# Patient Record
Sex: Male | Born: 1975 | Race: White | Hispanic: No | Marital: Married | Smoking: Former smoker
Health system: Southern US, Community
[De-identification: ages and names within clinical notes are randomized; demographics above are authoritative.]

## PROBLEM LIST (undated history)

## (undated) DIAGNOSIS — I10 Essential (primary) hypertension: Secondary | ICD-10-CM

## (undated) DIAGNOSIS — G4730 Sleep apnea, unspecified: Secondary | ICD-10-CM

## (undated) DIAGNOSIS — K219 Gastro-esophageal reflux disease without esophagitis: Secondary | ICD-10-CM

## (undated) DIAGNOSIS — N200 Calculus of kidney: Secondary | ICD-10-CM

## (undated) HISTORY — PX: KNEE SURGERY: SHX244

## (undated) HISTORY — PX: LITHOTRIPSY: SUR834

---

## 1999-10-11 ENCOUNTER — Emergency Department (HOSPITAL_COMMUNITY): Admission: EM | Admit: 1999-10-11 | Discharge: 1999-10-11 | Payer: Self-pay | Admitting: Emergency Medicine

## 1999-10-13 ENCOUNTER — Emergency Department (HOSPITAL_COMMUNITY): Admission: EM | Admit: 1999-10-13 | Discharge: 1999-10-13 | Payer: Self-pay | Admitting: Podiatry

## 1999-11-14 ENCOUNTER — Emergency Department (HOSPITAL_COMMUNITY): Admission: EM | Admit: 1999-11-14 | Discharge: 1999-11-14 | Payer: Self-pay | Admitting: Emergency Medicine

## 1999-11-14 ENCOUNTER — Encounter: Payer: Self-pay | Admitting: Emergency Medicine

## 2000-04-19 ENCOUNTER — Emergency Department (HOSPITAL_COMMUNITY): Admission: EM | Admit: 2000-04-19 | Discharge: 2000-04-19 | Payer: Self-pay | Admitting: Emergency Medicine

## 2000-09-06 ENCOUNTER — Encounter: Payer: Self-pay | Admitting: Emergency Medicine

## 2000-09-06 ENCOUNTER — Emergency Department (HOSPITAL_COMMUNITY): Admission: EM | Admit: 2000-09-06 | Discharge: 2000-09-06 | Payer: Self-pay | Admitting: Emergency Medicine

## 2000-12-01 ENCOUNTER — Emergency Department (HOSPITAL_COMMUNITY): Admission: EM | Admit: 2000-12-01 | Discharge: 2000-12-01 | Payer: Self-pay | Admitting: *Deleted

## 2002-07-30 ENCOUNTER — Emergency Department (HOSPITAL_COMMUNITY): Admission: EM | Admit: 2002-07-30 | Discharge: 2002-07-30 | Payer: Self-pay | Admitting: Emergency Medicine

## 2002-08-26 ENCOUNTER — Emergency Department (HOSPITAL_COMMUNITY): Admission: EM | Admit: 2002-08-26 | Discharge: 2002-08-26 | Payer: Self-pay | Admitting: Emergency Medicine

## 2002-08-26 ENCOUNTER — Encounter: Payer: Self-pay | Admitting: Emergency Medicine

## 2002-09-16 ENCOUNTER — Ambulatory Visit (HOSPITAL_BASED_OUTPATIENT_CLINIC_OR_DEPARTMENT_OTHER): Admission: RE | Admit: 2002-09-16 | Discharge: 2002-09-16 | Payer: Self-pay | Admitting: Urology

## 2003-01-18 ENCOUNTER — Emergency Department (HOSPITAL_COMMUNITY): Admission: EM | Admit: 2003-01-18 | Discharge: 2003-01-19 | Payer: Self-pay | Admitting: Emergency Medicine

## 2003-01-18 ENCOUNTER — Encounter: Payer: Self-pay | Admitting: Emergency Medicine

## 2003-03-08 ENCOUNTER — Emergency Department (HOSPITAL_COMMUNITY): Admission: EM | Admit: 2003-03-08 | Discharge: 2003-03-08 | Payer: Self-pay | Admitting: Emergency Medicine

## 2003-03-20 ENCOUNTER — Emergency Department (HOSPITAL_COMMUNITY): Admission: EM | Admit: 2003-03-20 | Discharge: 2003-03-20 | Payer: Self-pay | Admitting: *Deleted

## 2003-03-21 ENCOUNTER — Encounter: Payer: Self-pay | Admitting: Emergency Medicine

## 2003-03-21 IMAGING — CT CT ABDOMEN W/O CM
1 series · 16 of 32 positions shown, 20 images · non-contrast
Comparison: none

FINDINGS
CLINICAL DATA: ABDOMINAL PAIN, ESPECIALLY ON THE LEFT.
CT SCAN OF THE ABDOMEN WITHOUT CONTRAST
COMPARING TO [DATE].
TECHNIQUE: CONTIGUOUS AXIAL CT IMAGES WERE OBTAINED THROUGH THE ADRENAL GLANDS THROUGH THE ILIAC CRESTS.

[Series 2: renal stone · axial · 0.86mm/px · z∈[-505,-90]mm · 16 of 92 slices shown, 20 images]
[im 6/92  soft-tissue]
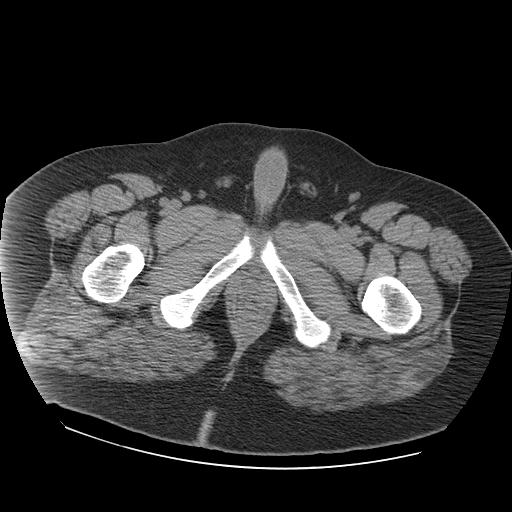
[im 6/92  bone]
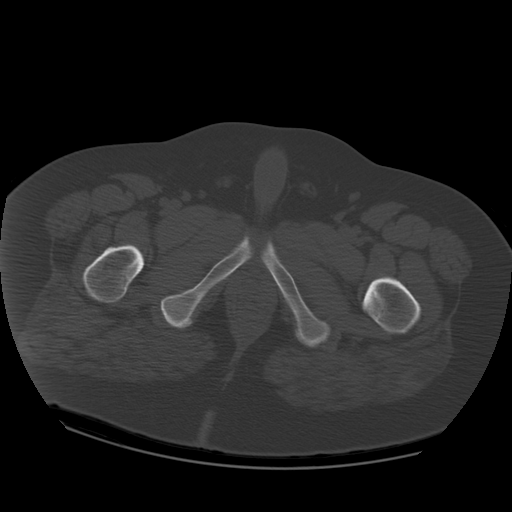
[im 12/92  soft-tissue]
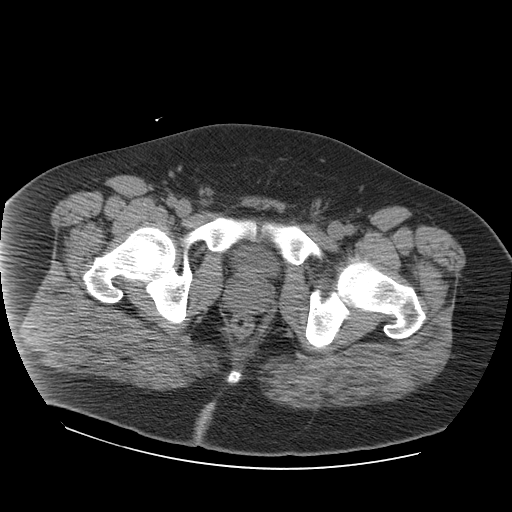
[im 18/92  soft-tissue]
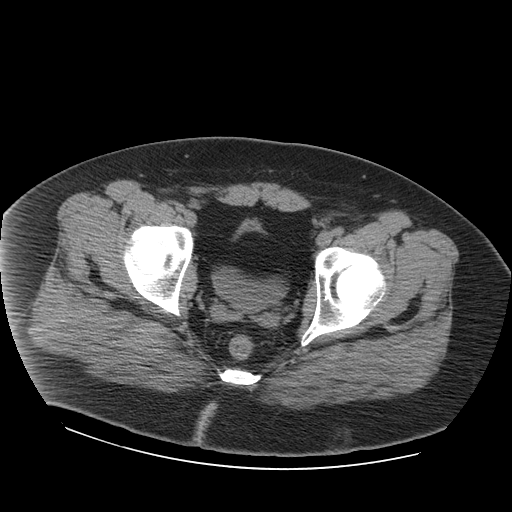
[im 24/92  soft-tissue]
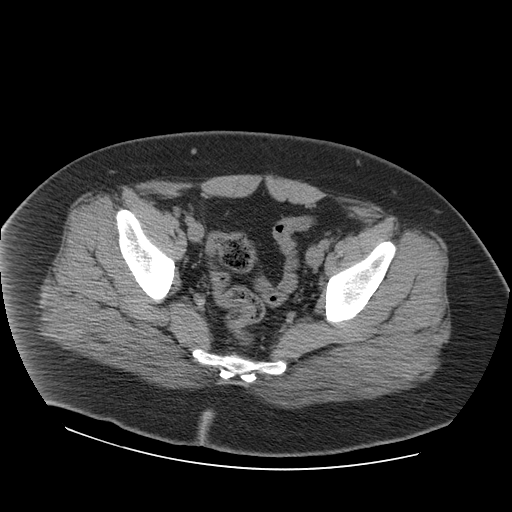
[im 30/92  soft-tissue]
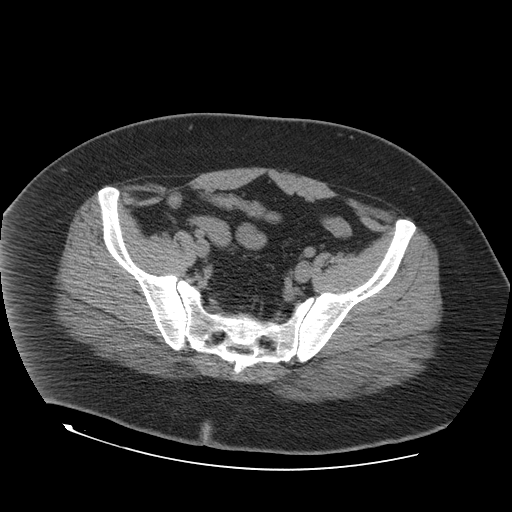
[im 36/92  soft-tissue]
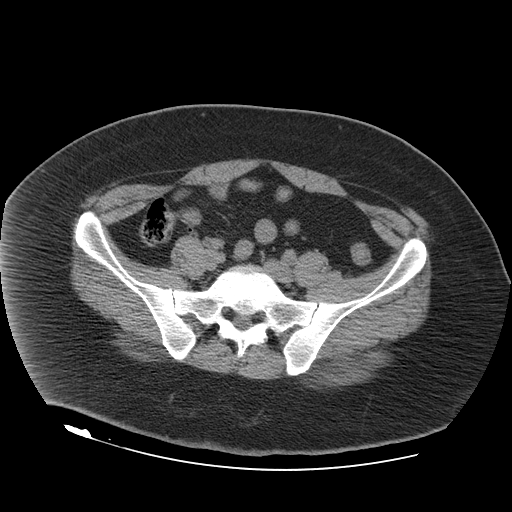
[im 42/92  soft-tissue]
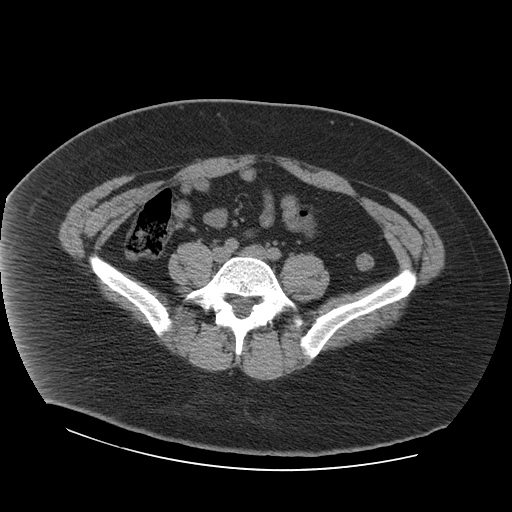
[im 50/92  soft-tissue]
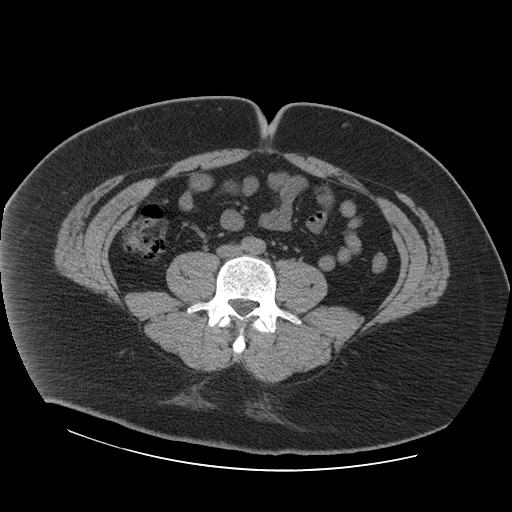
[im 56/92  soft-tissue]
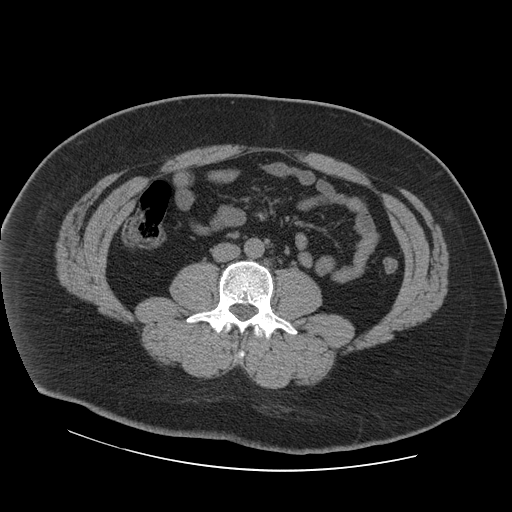
[im 56/92  bone]
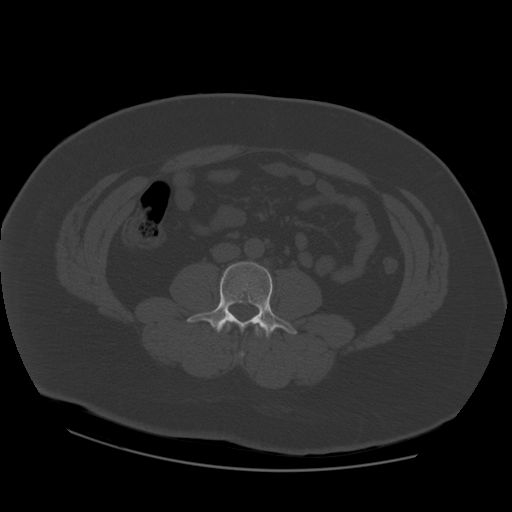
[im 62/92  soft-tissue]
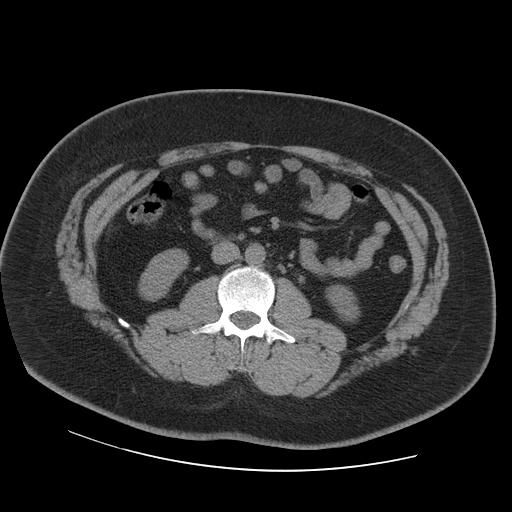
[im 68/92  soft-tissue]
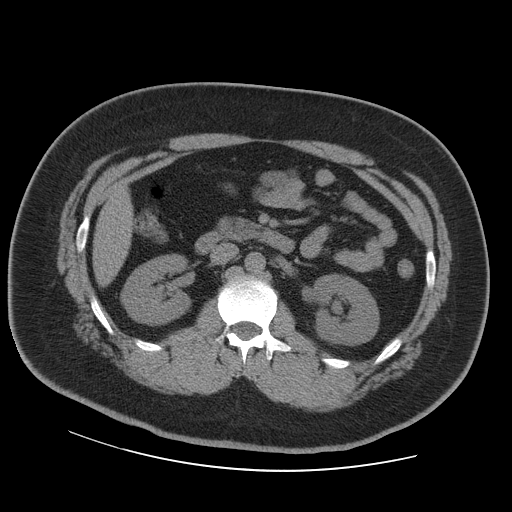
[im 74/92  soft-tissue]
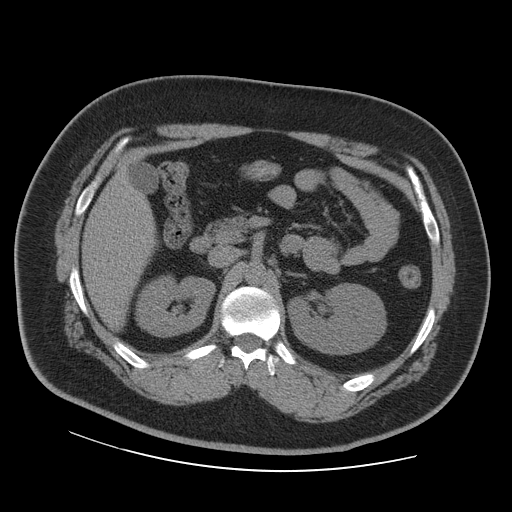
[im 80/92  soft-tissue]
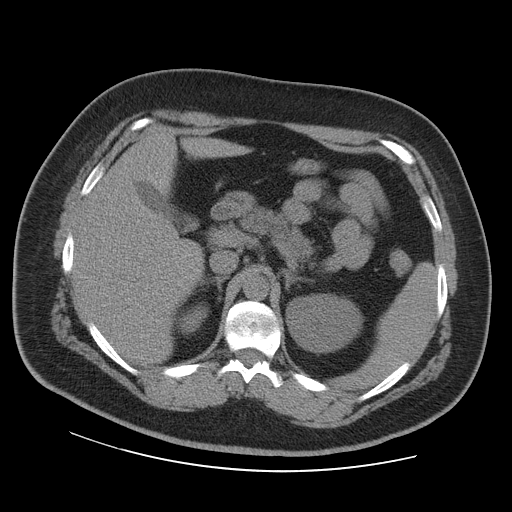
[im 80/92  lung]
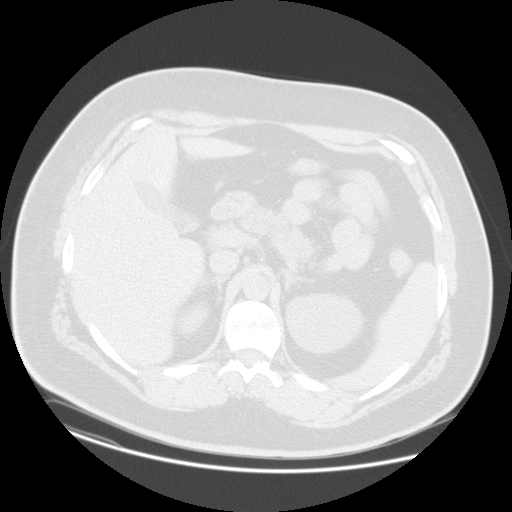
[im 83/92  lung]
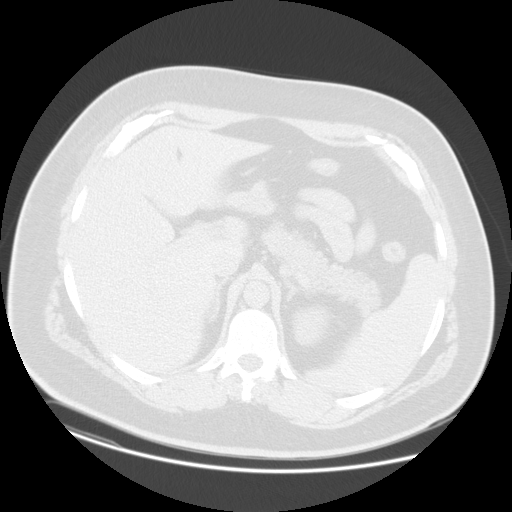
[im 86/92  soft-tissue]
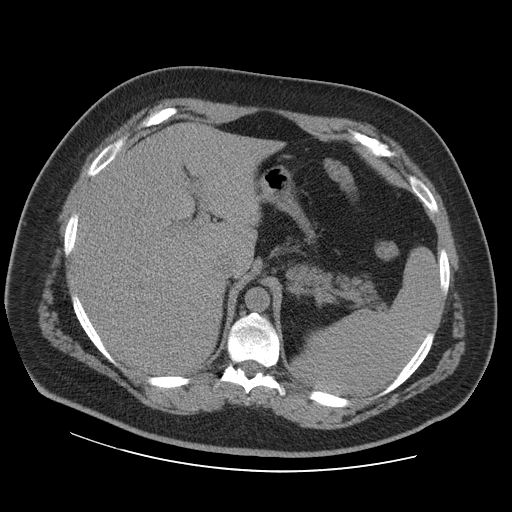
[im 86/92  lung]
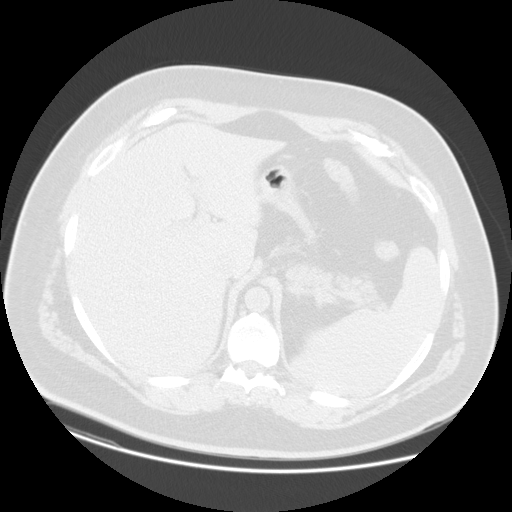
[im 89/92  lung]
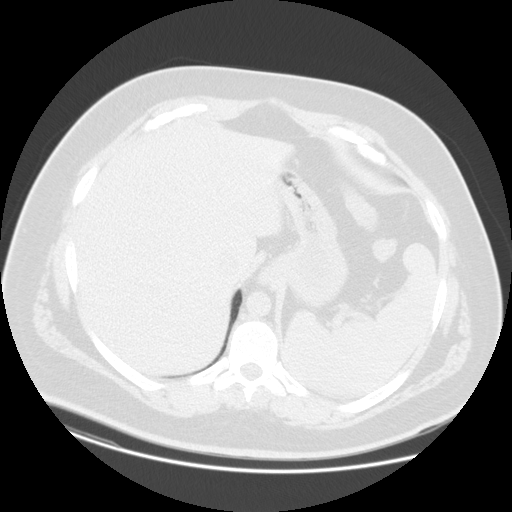

[16 of 32 positions shown; findings below may reference images not displayed]

FINDINGS: MILD LEFT HYDRONEPHROSIS IS PRESENT.  THERE IS MILD HYDROURETER AND PERIURETERAL STRANDING
EXTENDING DOWN TO A 2 MM LEFT PROXIMAL URETERAL CALCULUS.  DISTAL TO THIS POINT, THE URETER IS OF
NORMAL CALIBER AND NO MORE DISTAL CALCULUS IS IDENTIFIED.
NO COLLECTING SYSTEM CALCULI ARE NOTED.
IMPRESSION
MILDLY OBSTRUCTIVE LEFT PROXIMAL URETERAL CALCULUS MEASURING 2 MM IN DIAMETER.
CT SCAN OF THE PELVIS WITHOUT CONTRAST
CONTIGUOUS AXIAL CT IMAGES WERE OBTAINED FROM THE ILIAC CRESTS TO THE PROXIMAL FEMORA.
FINDINGS: THE APPENDIX APPEARS NORMAL.  THE VISUALIZED BOWEL APPEARS NORMAL.  NO DISTAL CALCULUS.  URINARY
BLADDER APPEARS NORMAL.
IMPRESSION
1.  NO DISTAL CALCULUS.  APPENDIX APPEARS NORMAL.  PLEASE SEE CT ABDOMEN REPORT ABOVE.

## 2003-12-15 ENCOUNTER — Emergency Department (HOSPITAL_COMMUNITY): Admission: EM | Admit: 2003-12-15 | Discharge: 2003-12-15 | Payer: Self-pay | Admitting: Emergency Medicine

## 2003-12-15 IMAGING — CT CT EXTREM LOW W/O CM*R*
2 of 5 series · 7 of 14 positions shown, 9 images · non-contrast
Comparison: none

[Series 2: lower extremity · axial · 0.63mm/px · z∈[-151,-91]mm · 2 of 72 slices shown]
[im 24/72  bone]
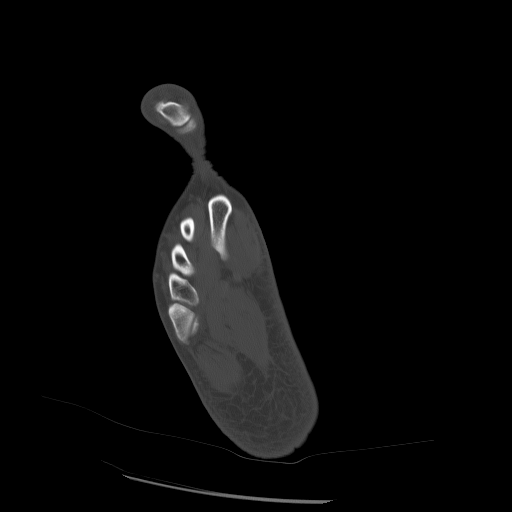
[im 48/72  bone]
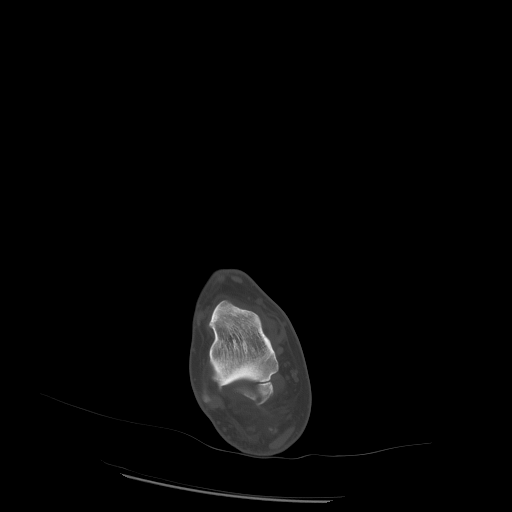

[Series 3: recon 2: lower extremity · axial · 0.63mm/px · z∈[-180,-61]mm · 5 of 143 slices shown, 7 images]
[im 24/143  soft-tissue]
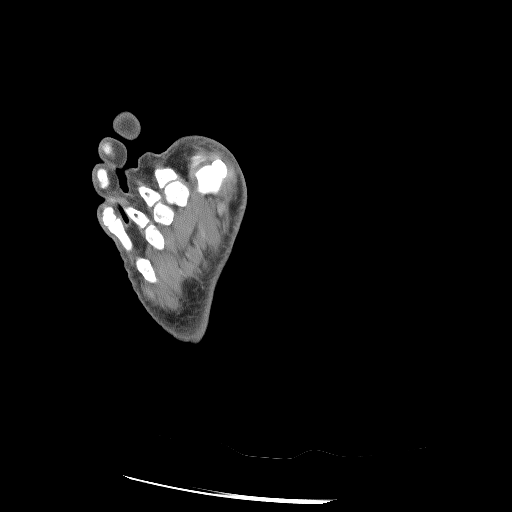
[im 24/143  bone]
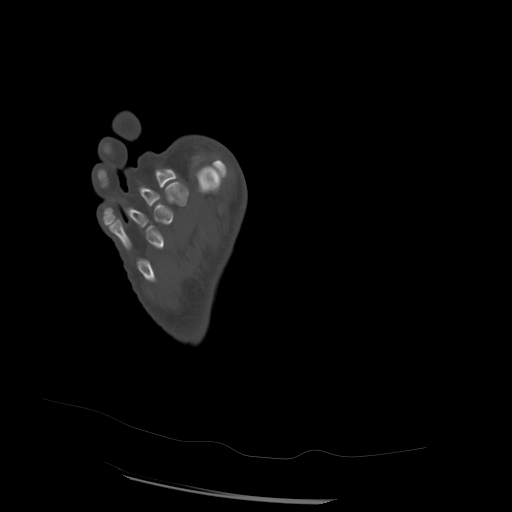
[im 48/143  bone]
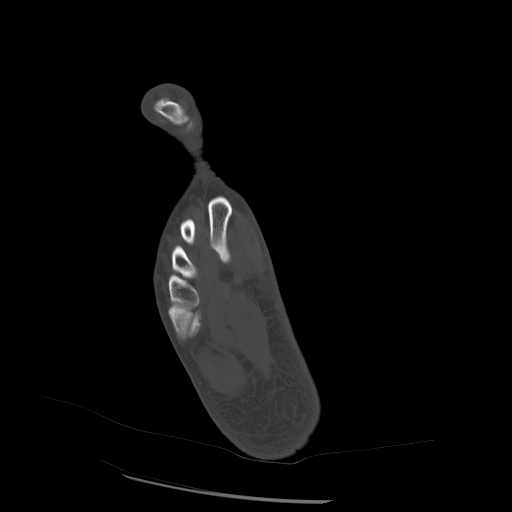
[im 72/143  bone]
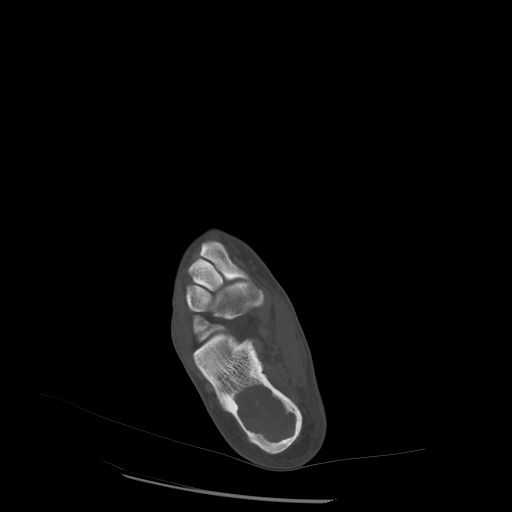
[im 95/143  bone]
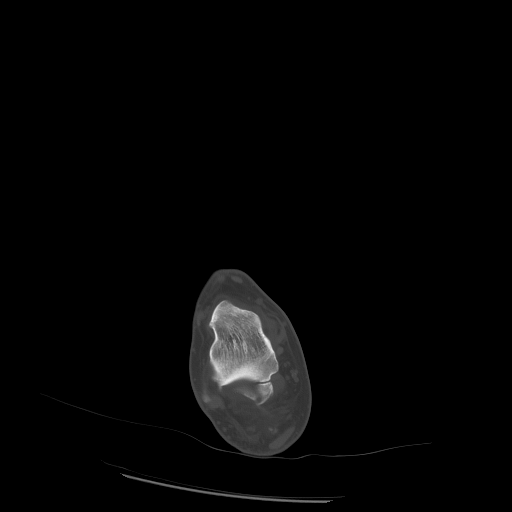
[im 119/143  soft-tissue]
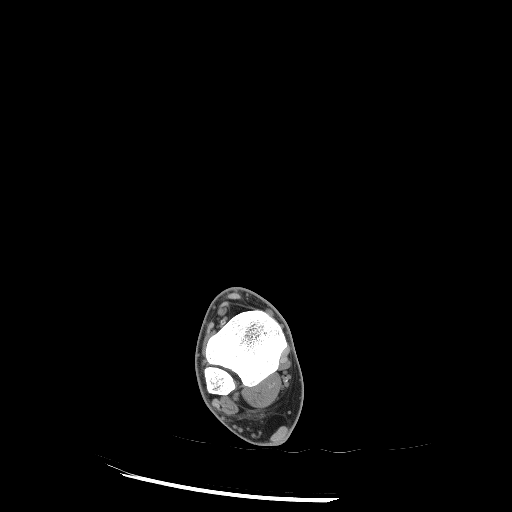
[im 119/143  bone]
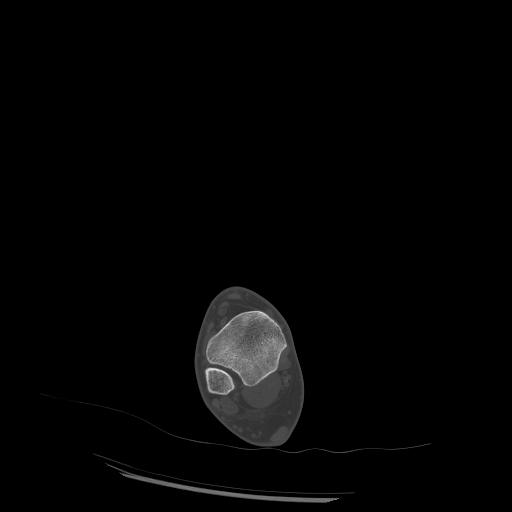

[7 of 14 positions shown; findings below may reference images not displayed]

<!--  IDXRADR:ADDEND:BEGIN -->Addendum Begins<!--  IDXRADR:ADDEND:INNER_BEGIN -->Upon further review of this case, the lucent calcaneal lesion may in fact reflect a unicameral bone cyst with hemorrhage related to an occult fracture from the patient?s fall.  This is a fairly typical location for a unicameral bone cyst, and the lack of significant osseous expansion would argue against an aneurysmal bone cyst.  If this were asymptomatic prior to the patient?s trauma, that would also favor this reflecting a unicameral bone cyst.  

 <!--  IDXRADR:ADDEND:INNER_END -->Addendum Ends
<!--  IDXRADR:ADDEND:END -->Clinical Data:  Right foot injury.  Calcaneal lesion on radiographs. 
CT OF THE RIGHT HINDFOOT WITHOUT CONTRAST  
Multidetector helical CT imaging of the right ankle and hindfoot was performed without intravenous contrast.  Ankle radiographs done earlier today are correlated.  Multiplanar reformatted images were generated. 
There is a slightly expansile, septated lucent lesion within the calcaneal tuberosity, measuring up to 4.7 x 2.9 cm transverse.  Multiple fluid/fluid levels are present within this lesion with dependent high density.  No pathologic fracture or surrounding soft tissue edema is demonstrated.  The lesion extends towards the subchondral portion of the posterior subtalar facet, but there is no intra-articular extension.  No associated soft tissue mass is demonstrated. 
No other lucent lesions are seen.  The ankle is included on the exam and demonstrates no fracture.  The tibiotalar joint appears unremarkable.
IMPRESSION
1.  CT confirms the presence of a slightly expansile septated cystic lesion in the calcaneus with nonaggressive features, most compatible with an aneurysmal bone cyst in a 27-year-old.  
  This lesion is prone to pathologic fracture.   No fractures are demonstrated at this time. 
3.  Orthopedic consultation is recommended. 
CT MULTIPLANAR RECONSTRUCTION
Multiplanar reformatted CT images were reconstructed from the axial CT data set.  These images were reviewed, and pertinent findings are included in the accompanying complete CT report.
IMPRESSION
See complete CT report.

## 2004-03-22 ENCOUNTER — Emergency Department (HOSPITAL_COMMUNITY): Admission: EM | Admit: 2004-03-22 | Discharge: 2004-03-23 | Payer: Self-pay | Admitting: Emergency Medicine

## 2004-03-22 ENCOUNTER — Emergency Department (HOSPITAL_COMMUNITY): Admission: EM | Admit: 2004-03-22 | Discharge: 2004-03-22 | Payer: Self-pay

## 2004-03-23 ENCOUNTER — Ambulatory Visit (HOSPITAL_COMMUNITY): Admission: RE | Admit: 2004-03-23 | Discharge: 2004-03-23 | Payer: Self-pay | Admitting: *Deleted

## 2004-03-23 IMAGING — US US ABDOMEN COMPLETE
1 series · 14 of 25 positions shown · non-contrast
Comparison: none

CLINICAL DATA: Abdominal pain.
 ULTRASOUND ABDOMEN COMPLETE
 The gallbladder size and contour are normal.  No stones or wall thickening.  Common duct 4 mm.  No focal lesions of the liver or spleen.  Splenic size upper limits of normal.  The pancreas suboptimally visualized due to overlying bowel gas.  The kidney size is normal.  No hydronephrosis.  IVC and aorta normal.
 IMPRESSION
 Normal study except that the pancreas is suboptimally visualized.

[Series 1: unknown · 0.33mm/px · 14 of 48 slices shown]
[im 1/48]
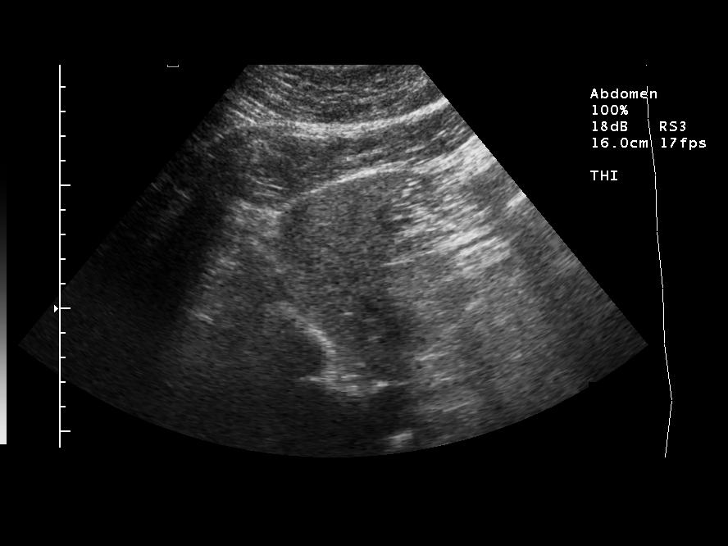
[im 4/48]
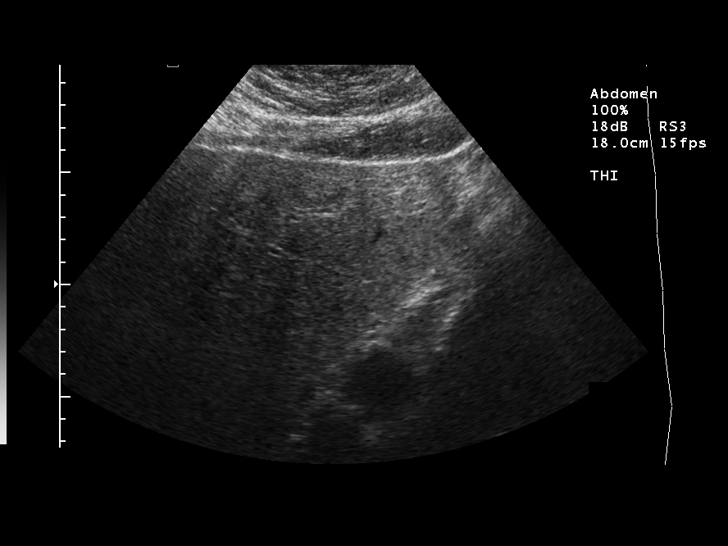
[im 8/48]
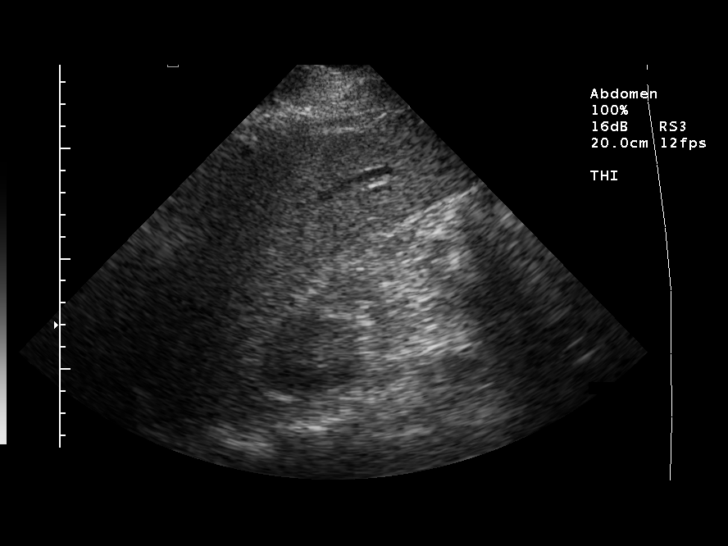
[im 12/48]
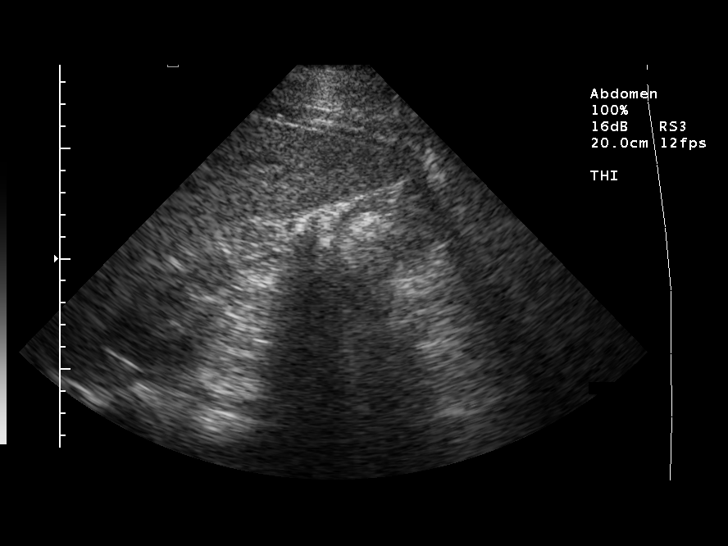
[im 16/48]
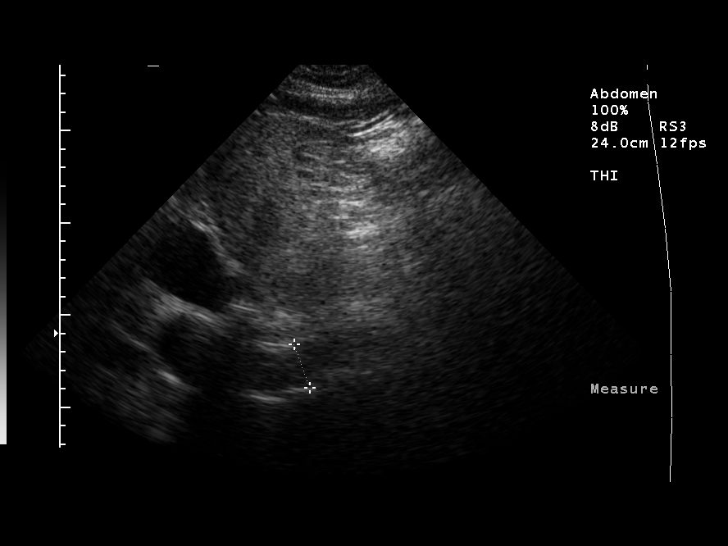
[im 18/48]
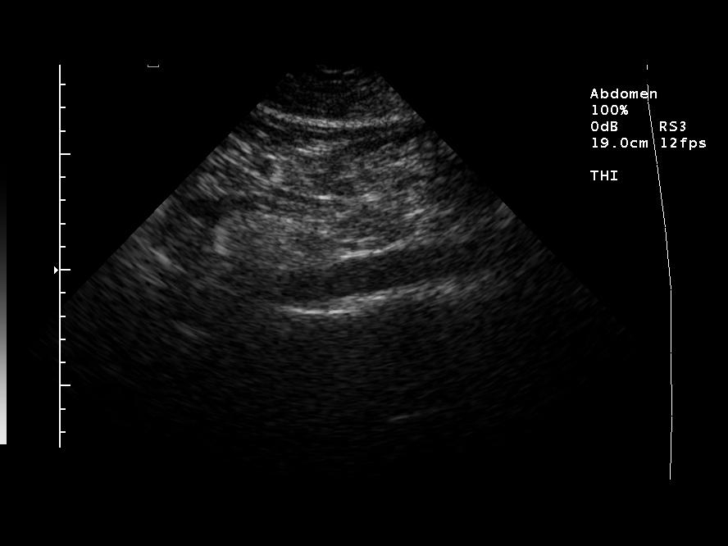
[im 22/48]
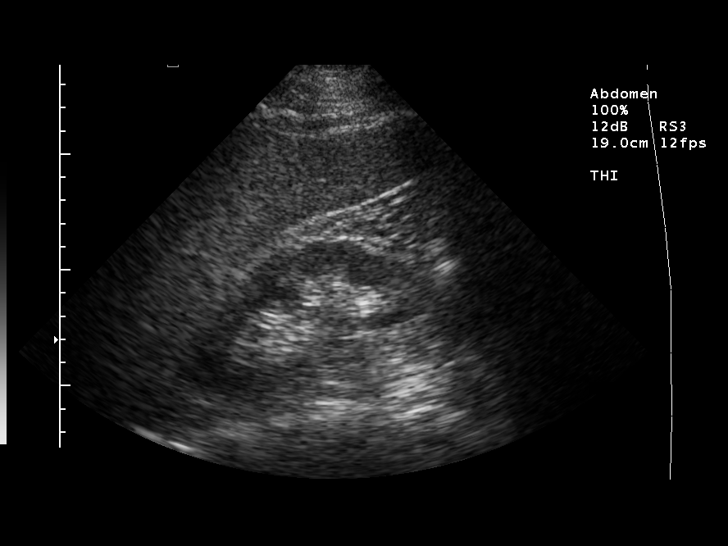
[im 26/48]
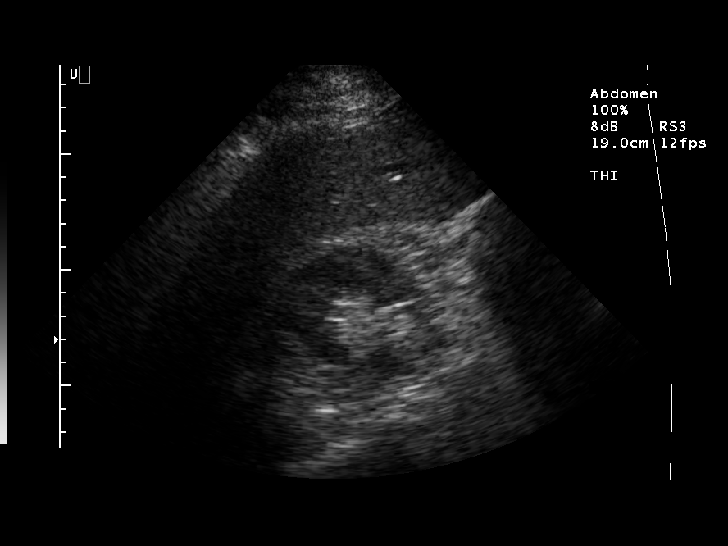
[im 30/48]
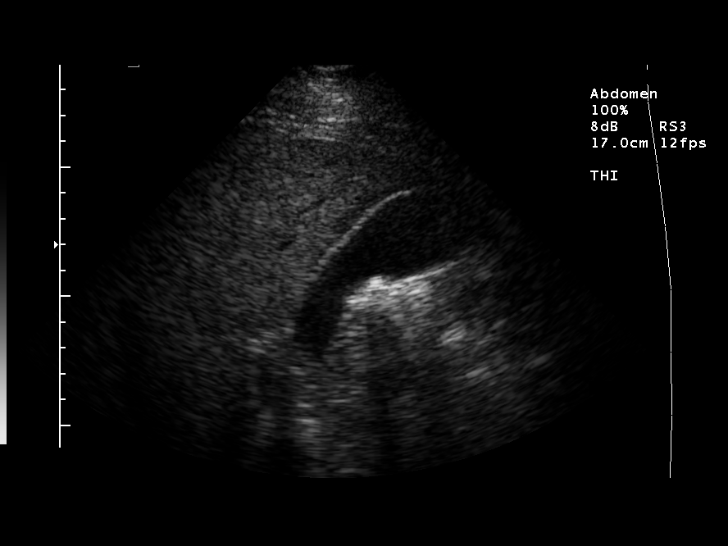
[im 32/48]
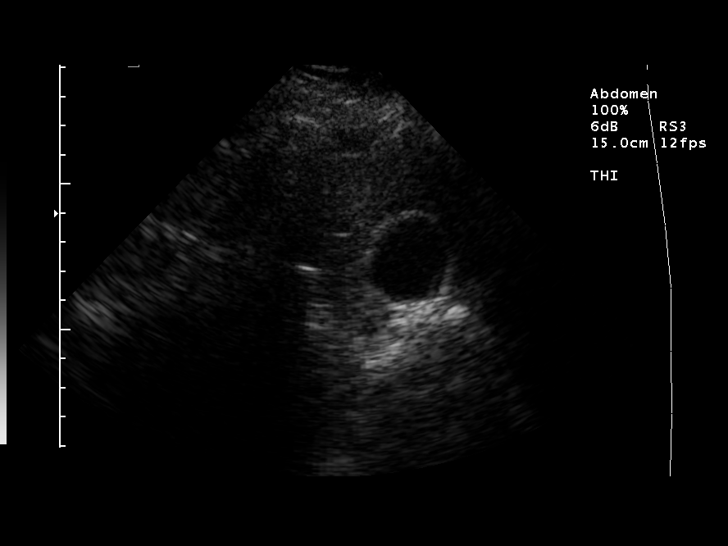
[im 36/48]
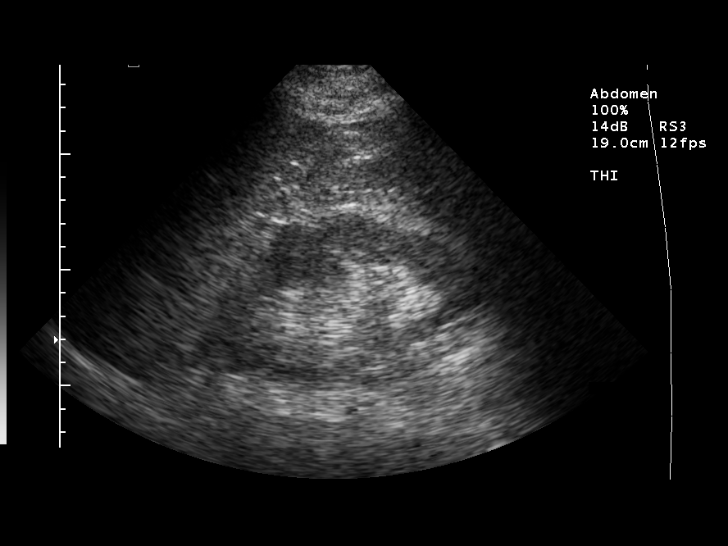
[im 40/48]
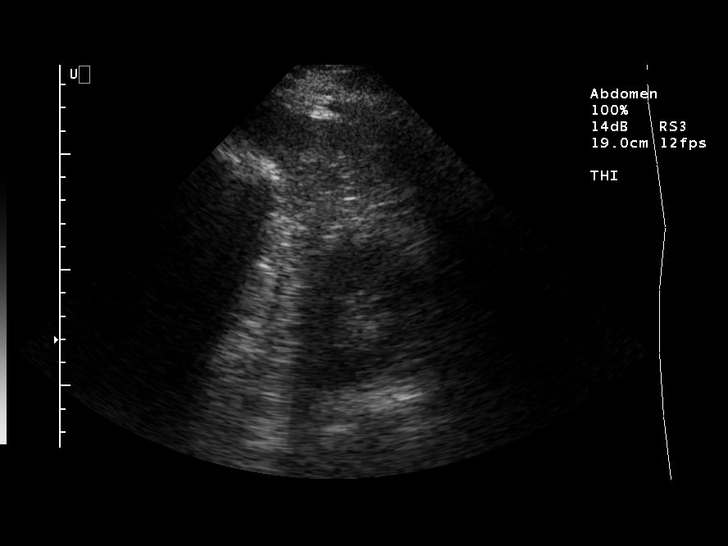
[im 44/48]
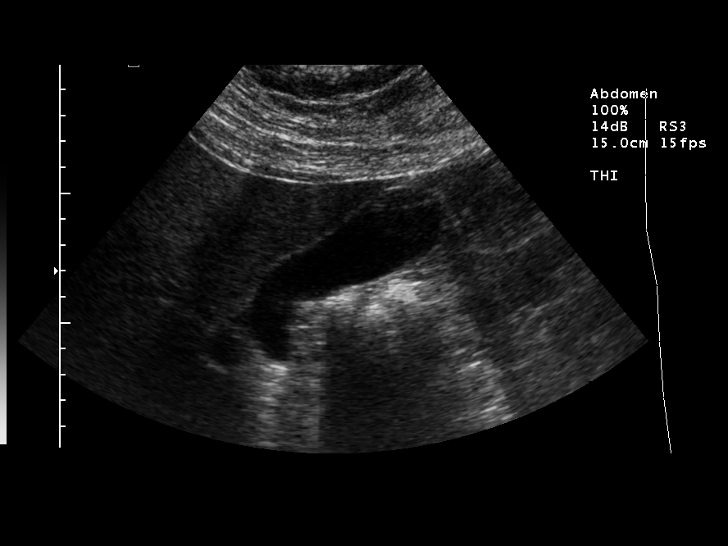
[im 48/48]
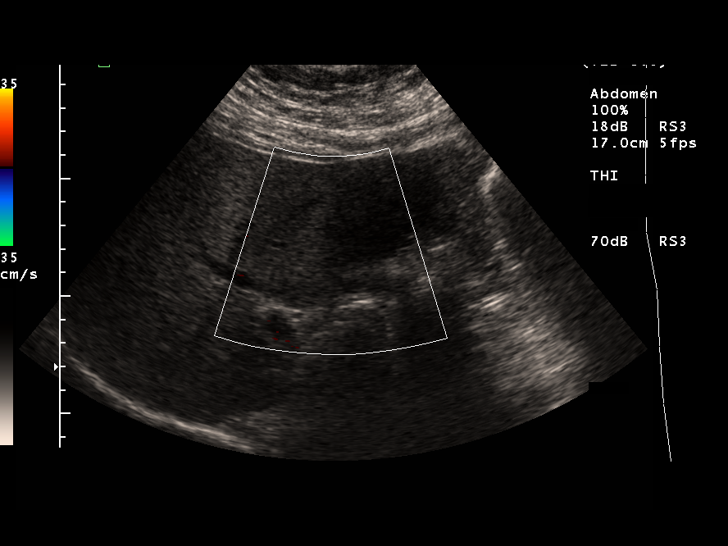

[14 of 25 positions shown; findings below may reference images not displayed]

## 2004-03-23 IMAGING — CT CT PELVIS W/O CM
1 series · 16 of 32 positions shown, 20 images · non-contrast
Comparison: [DATE].

CLINICAL DATA: right-sided flank pain; nausea and vomiting; history of stones
 CT ABDOMEN AND PELVIS WITHOUT CONTRAST [DATE]

[Series 2: renal stone · axial · 0.97mm/px · z∈[-507,-87]mm · 16 of 94 slices shown, 20 images]
[im 7/94  soft-tissue]
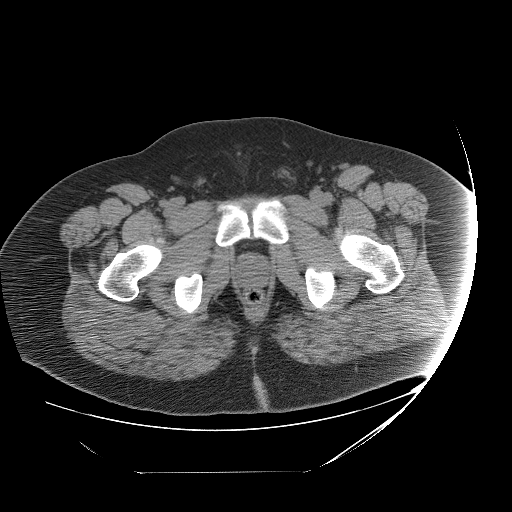
[im 7/94  bone]
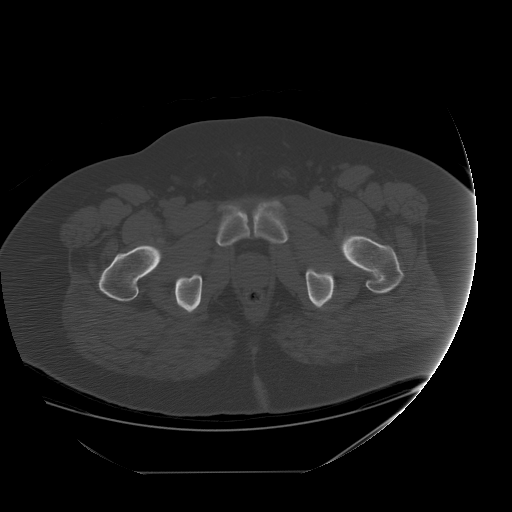
[im 13/94  soft-tissue]
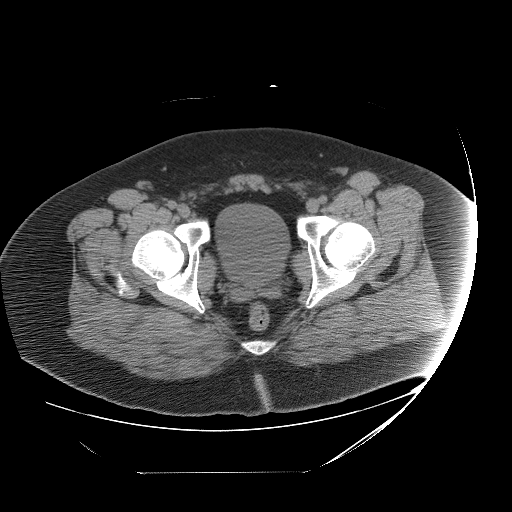
[im 19/94  soft-tissue]
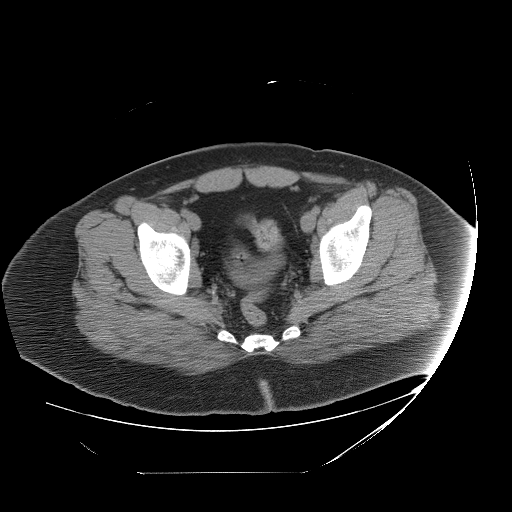
[im 25/94  soft-tissue]
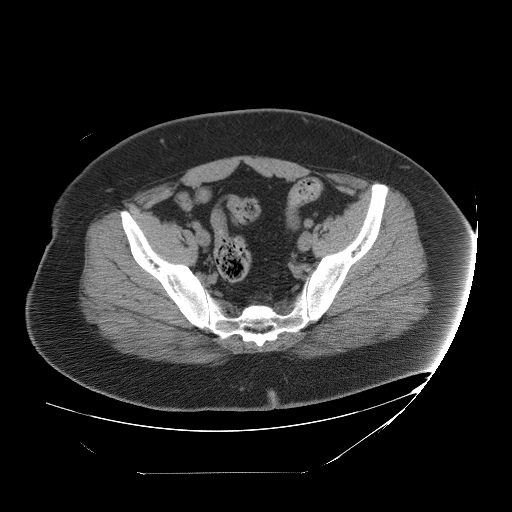
[im 31/94  soft-tissue]
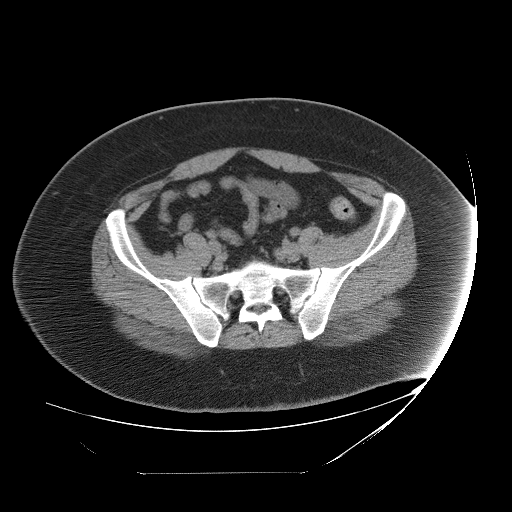
[im 37/94  soft-tissue]
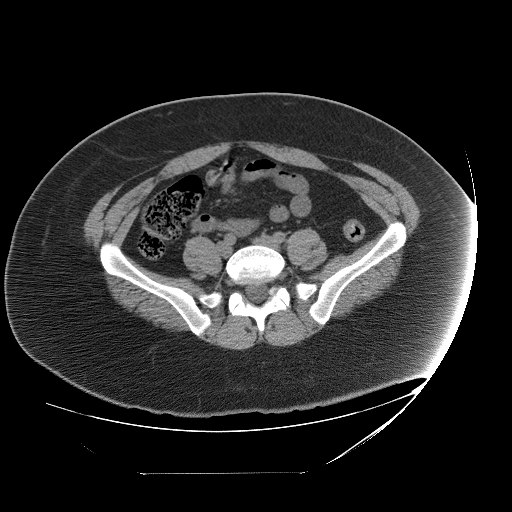
[im 43/94  soft-tissue]
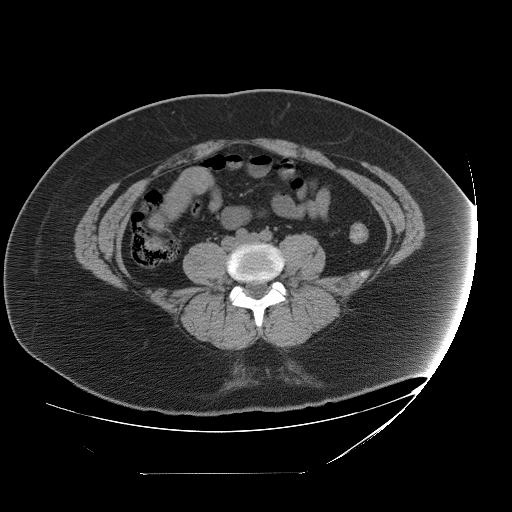
[im 52/94  soft-tissue]
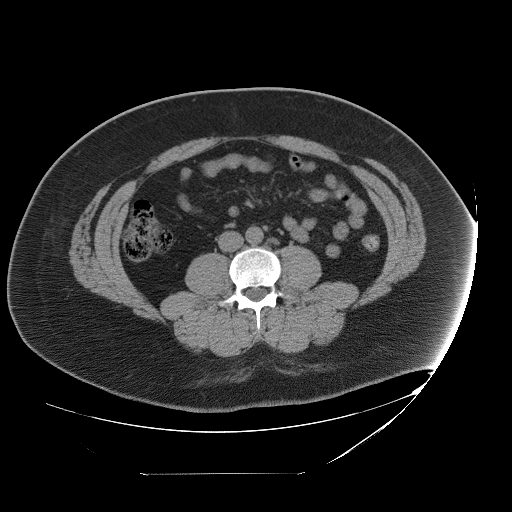
[im 58/94  soft-tissue]
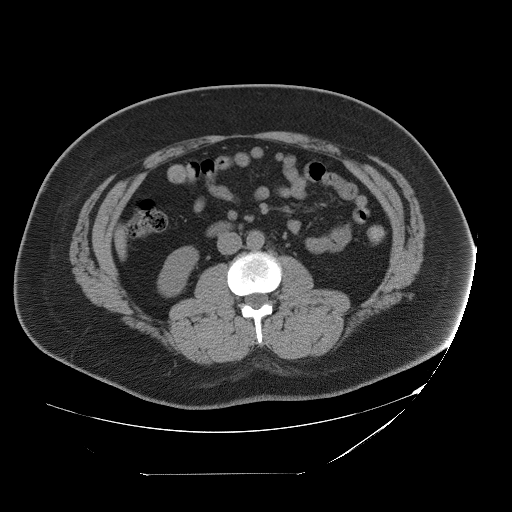
[im 58/94  bone]
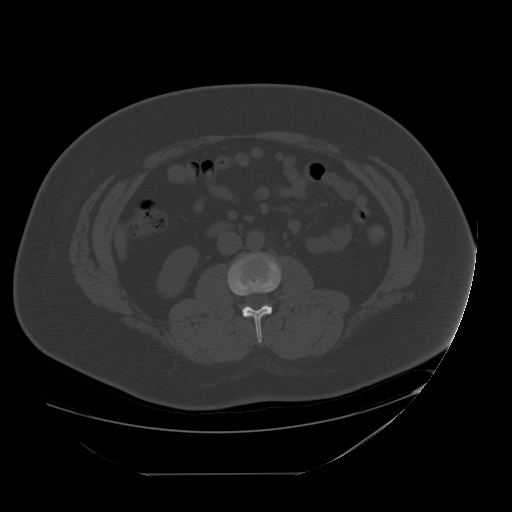
[im 64/94  soft-tissue]
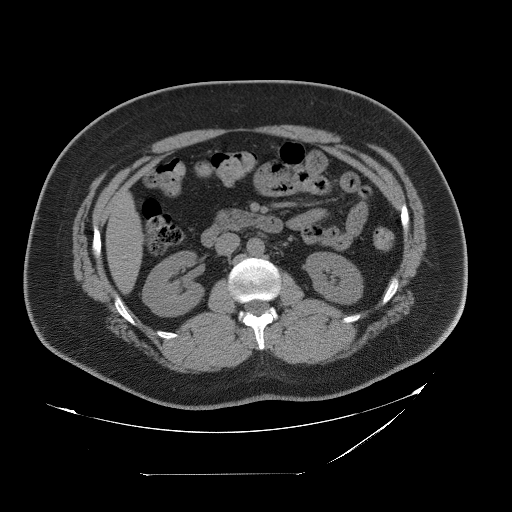
[im 70/94  soft-tissue]
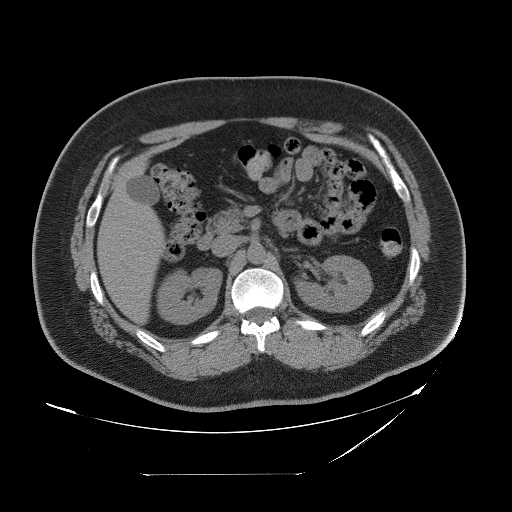
[im 76/94  soft-tissue]
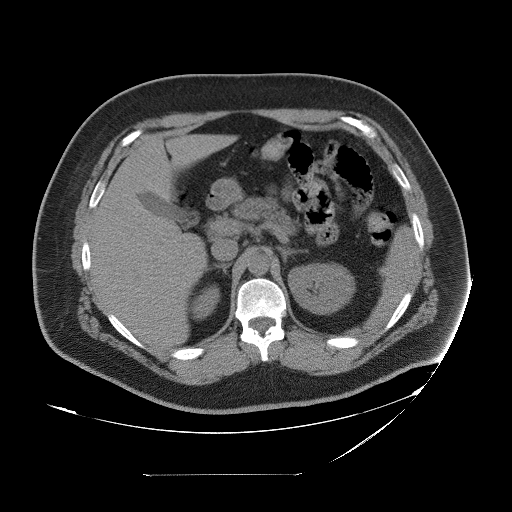
[im 82/94  soft-tissue]
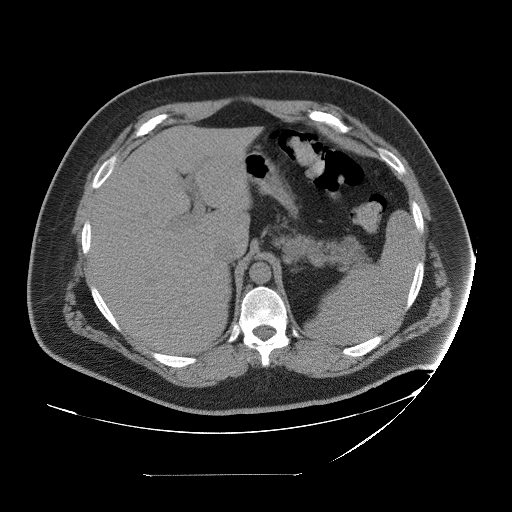
[im 82/94  lung]
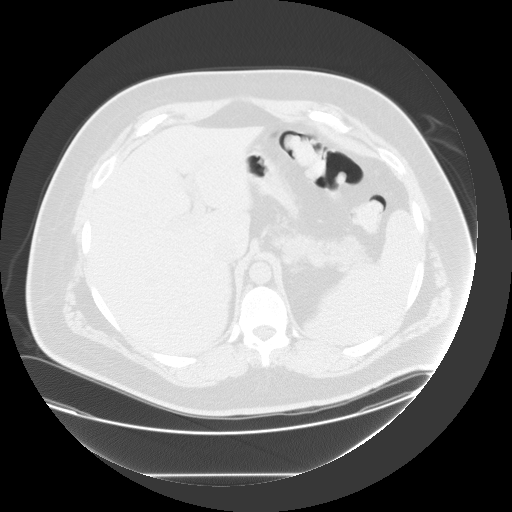
[im 85/94  lung]
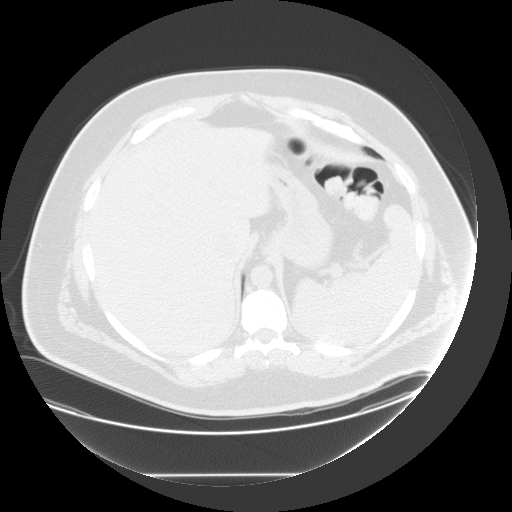
[im 88/94  soft-tissue]
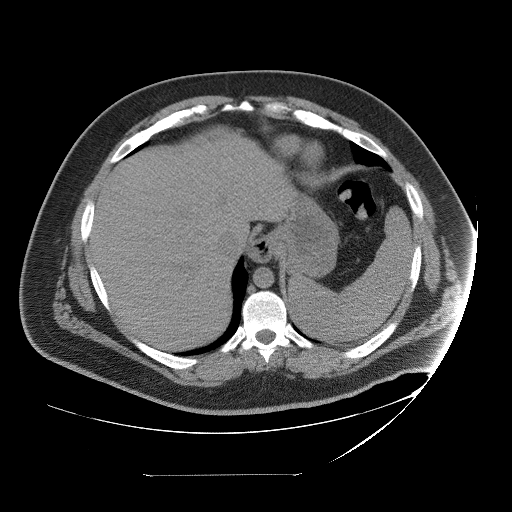
[im 88/94  lung]
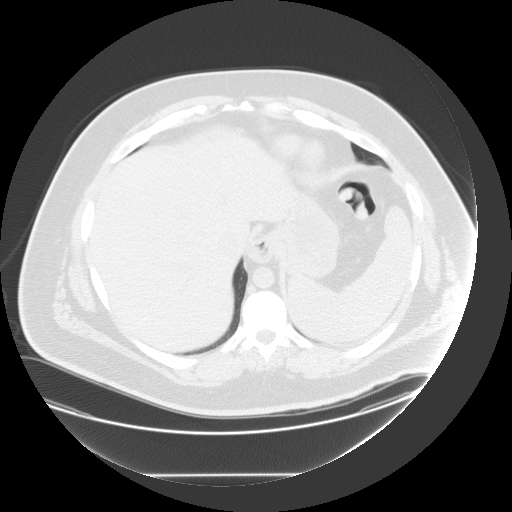
[im 91/94  lung]
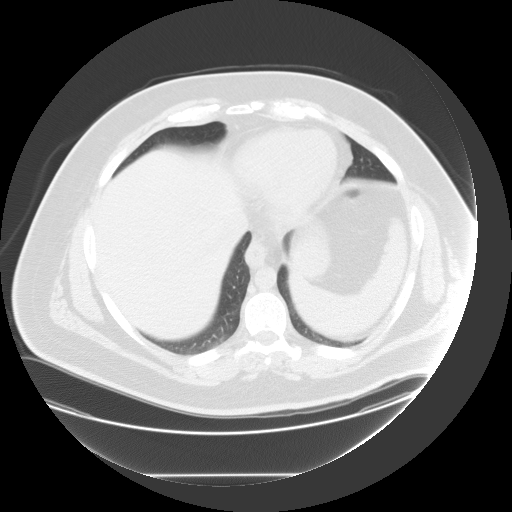

[16 of 32 positions shown; findings below may reference images not displayed]

CT ABDOMEN
 There is no evidence of renal or ureteral calculi or hydronephrosis.  The visualized solid organs have a normal uninfused appearance.  Liver and spleen are not imaged in their entirety.  Bowel and gallbladder are grossly unremarkable.  No free fluid, free air or adenopathy.
 IMPRESSION
 No acute abnormality in the abdomen.  
 CT PELVIS
 Appendix is visualized and is normal.  Ureters are decompressed.  No stone is visualized.  No free fluid, free air or adenopathy.  
 IMPRESSION
 No acute abnormality in the pelvis.

## 2004-08-31 ENCOUNTER — Emergency Department (HOSPITAL_COMMUNITY): Admission: EM | Admit: 2004-08-31 | Discharge: 2004-08-31 | Payer: Self-pay | Admitting: Emergency Medicine

## 2004-08-31 IMAGING — CT CT ABDOMEN W/O CM
1 series · 15 of 32 positions shown, 19 images · non-contrast
Comparison: none

CLINICAL DATA: 20-year-old with right flank pain. 
 CT ABDOMEN AND PELVIS WITHOUT CONTRAST 
 Helical CT examination of the abdomen and pelvis was performed without IV or oral contrast to specifically evaluate for renal/ureteral calculi/obstruction. 
 The lung bases are clear. 
 CT ABDOMEN 
 The visualized portions of the liver appear normal. The spleen is normal.  The pancreas, adrenal glands, and kidneys demonstrate no abnormalities.  Specifically, no evidence for renal obstructive process, renal or ureteral calculi.  The aorta is normal in caliber.  The stomach, duodenum, small bowel and colon are grossly normal though the study is limited without oral contrast.  The gallbladder appears normal.  No mesenteric or retroperitoneal masses or adenopathy. 
 IMPRESSION
 1.  Unremarkable CT abdomen. 
 CT PELVIS 
 Rectum, sigmoid colon, visualized small bowel loops and bladder appear normal.  Specifically, no evidence for ureteral obstruction or calculi.  No pelvic masses or adenopathy.  Bony structures are unremarkable. 
 1.  Unremarkable CT pelvis.

[Series 5: — · axial · 0.79mm/px · z∈[-360,+16]mm · 15 of 105 slices shown, 19 images]
[im 7/105  soft-tissue]
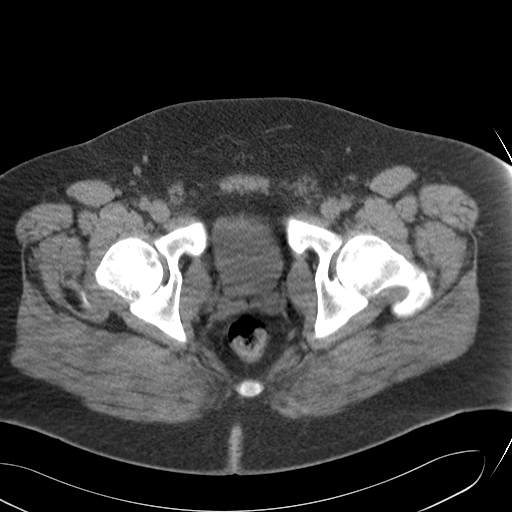
[im 7/105  bone]
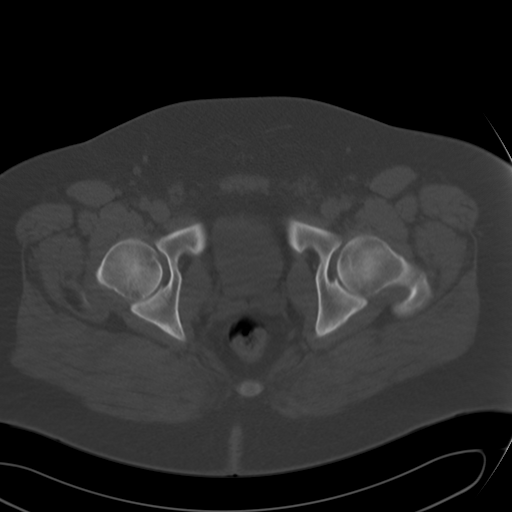
[im 14/105  soft-tissue]
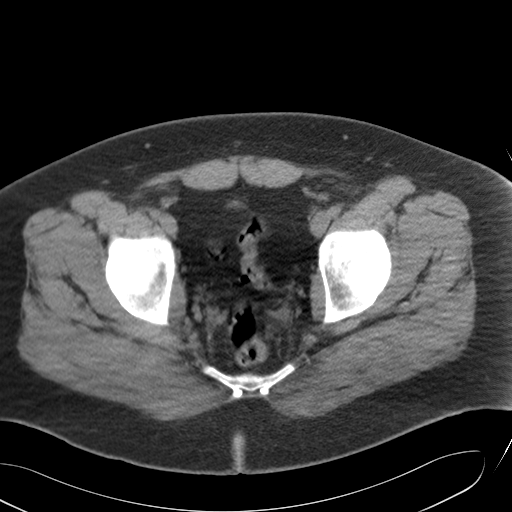
[im 21/105  soft-tissue]
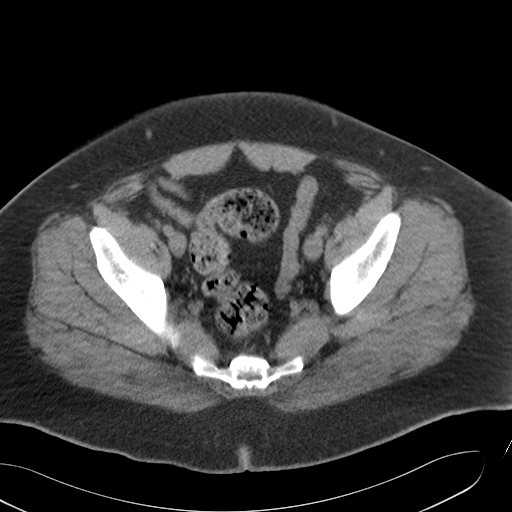
[im 31/105  soft-tissue]
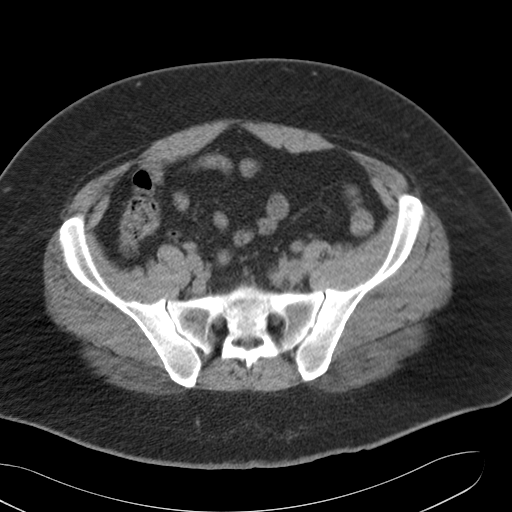
[im 37/105  soft-tissue]
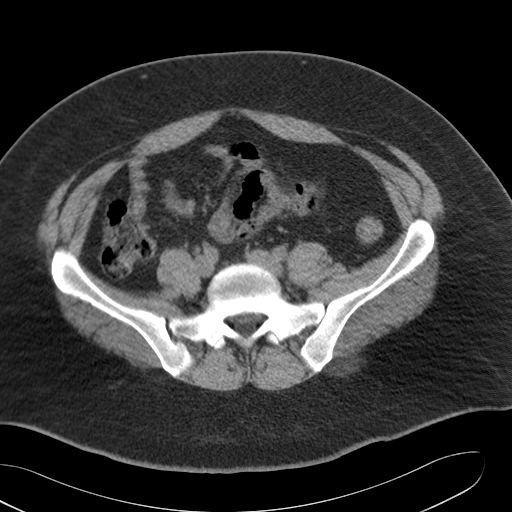
[im 44/105  soft-tissue]
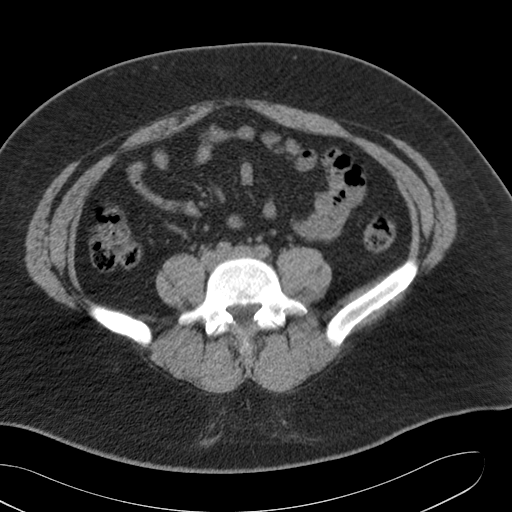
[im 54/105  soft-tissue]
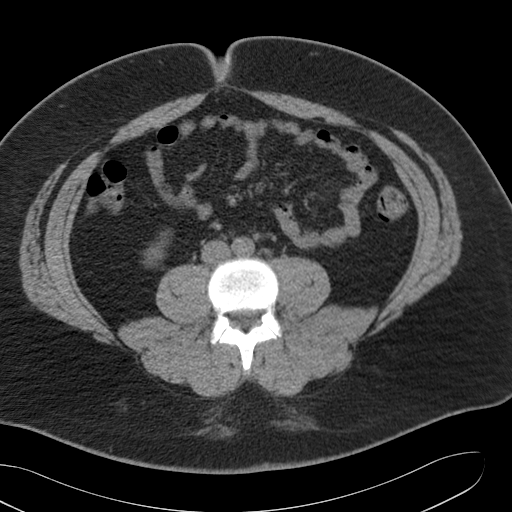
[im 61/105  soft-tissue]
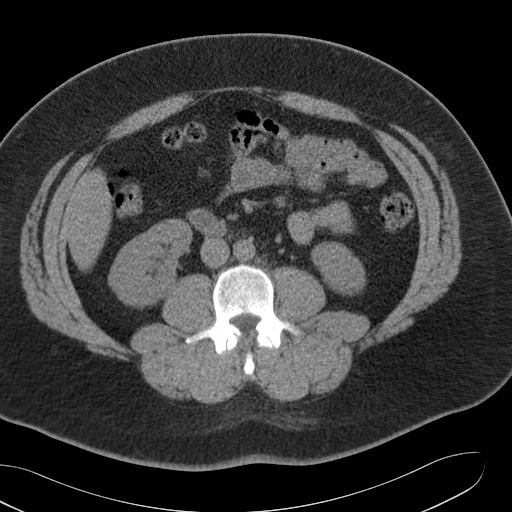
[im 68/105  soft-tissue]
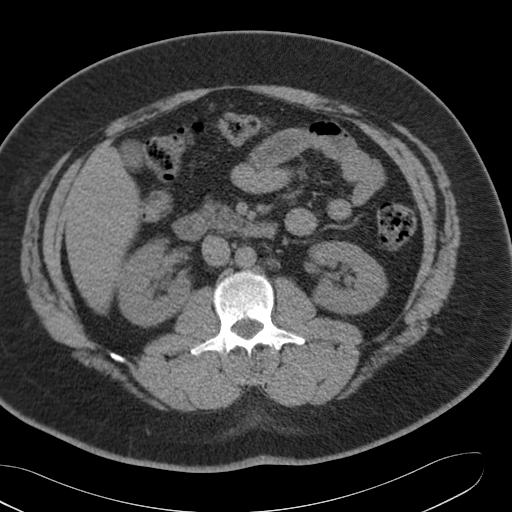
[im 68/105  bone]
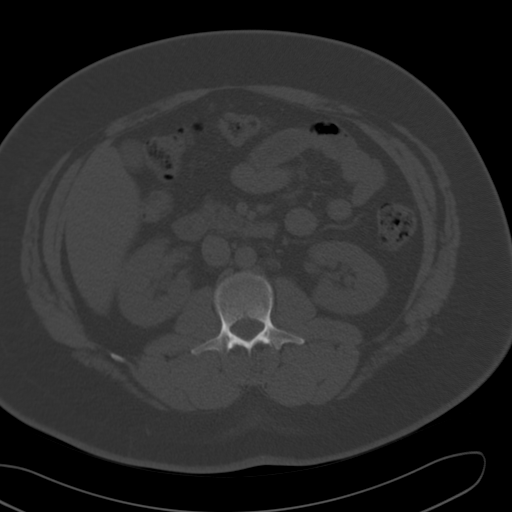
[im 74/105  soft-tissue]
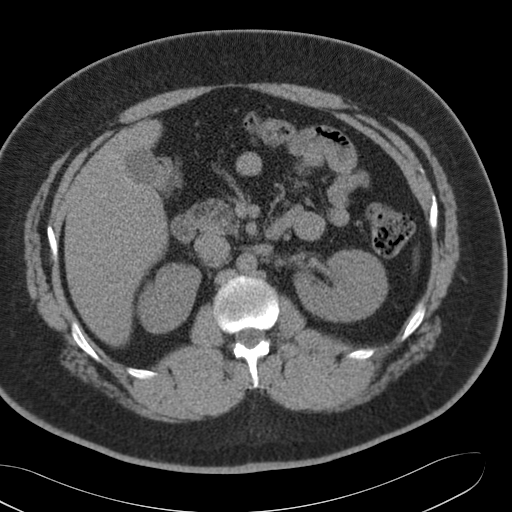
[im 84/105  soft-tissue]
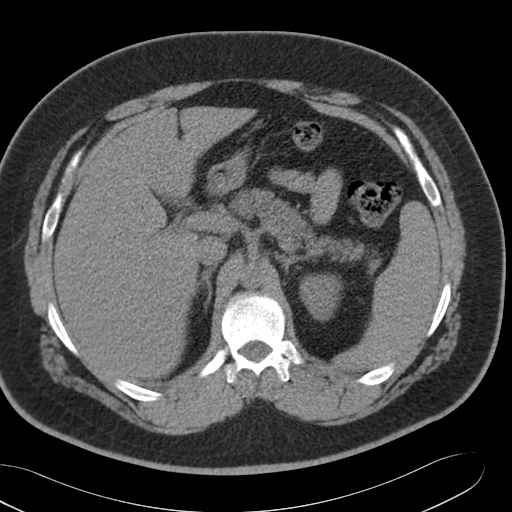
[im 91/105  soft-tissue]
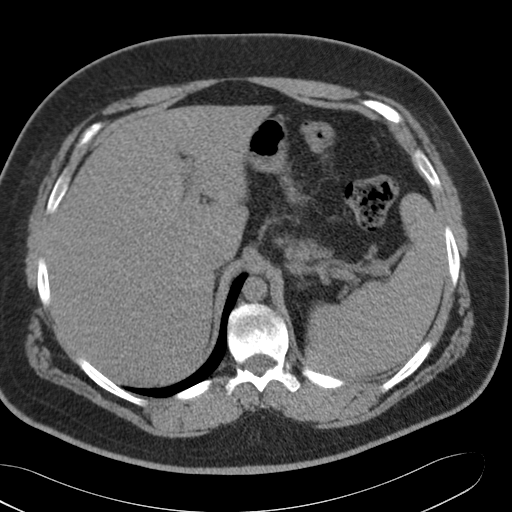
[im 91/105  lung]
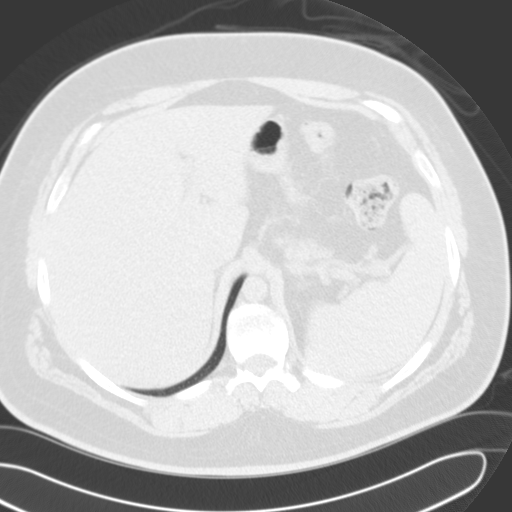
[im 94/105  lung]
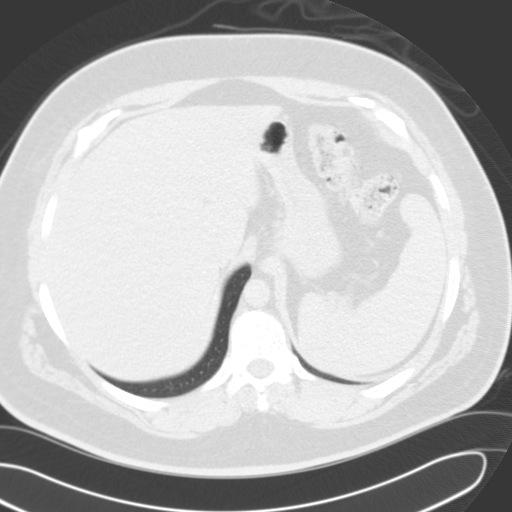
[im 98/105  soft-tissue]
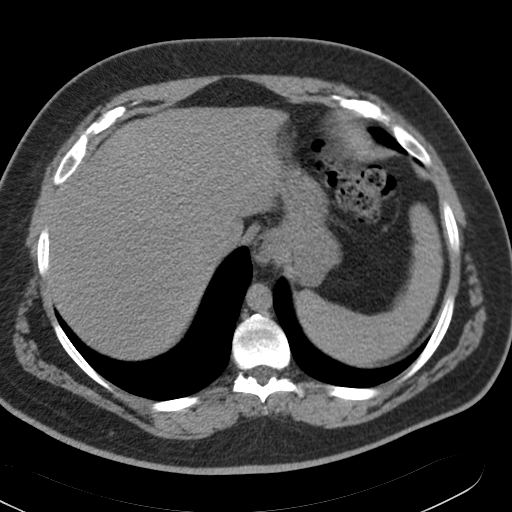
[im 98/105  lung]
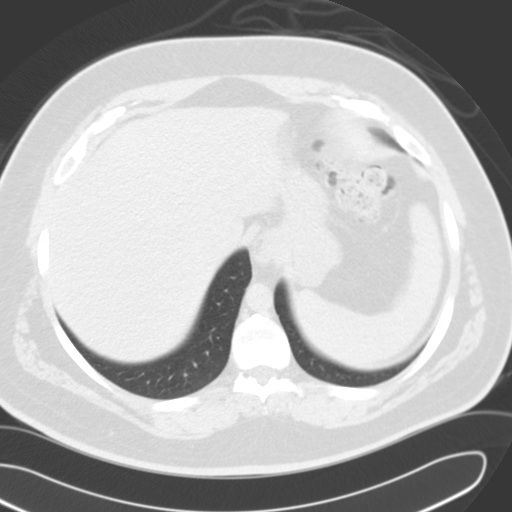
[im 101/105  lung]
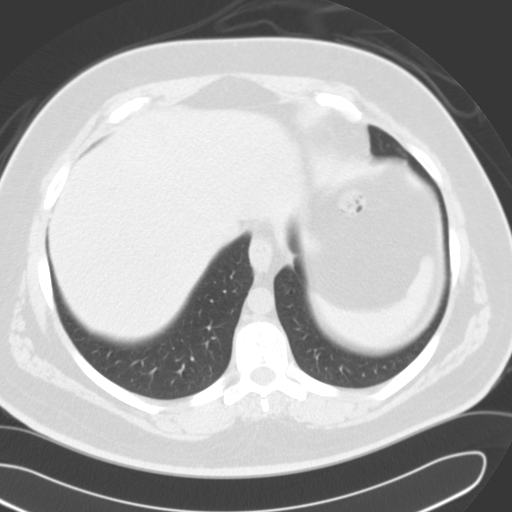

[15 of 32 positions shown; findings below may reference images not displayed]

## 2005-04-24 ENCOUNTER — Emergency Department (HOSPITAL_COMMUNITY): Admission: EM | Admit: 2005-04-24 | Discharge: 2005-04-24 | Payer: Self-pay | Admitting: Emergency Medicine

## 2005-04-24 IMAGING — CR DG FOOT COMPLETE 3+V*R*
3 series · 3 of 3 positions shown · non-contrast
Comparison: none

CLINICAL DATA: Pain, right foot.  
 RIGHT FOOT - THREE VIEW:
 Three views of the right foot were obtained.  No acute fracture is seen.  Alignment is normal.  Joint spaces are normal.  On the lateral view, there is lucency in the posterior aspect of the calcaneus most likely normal.  Clinical correlation is recommended.

[view not recorded (1 of 3)]
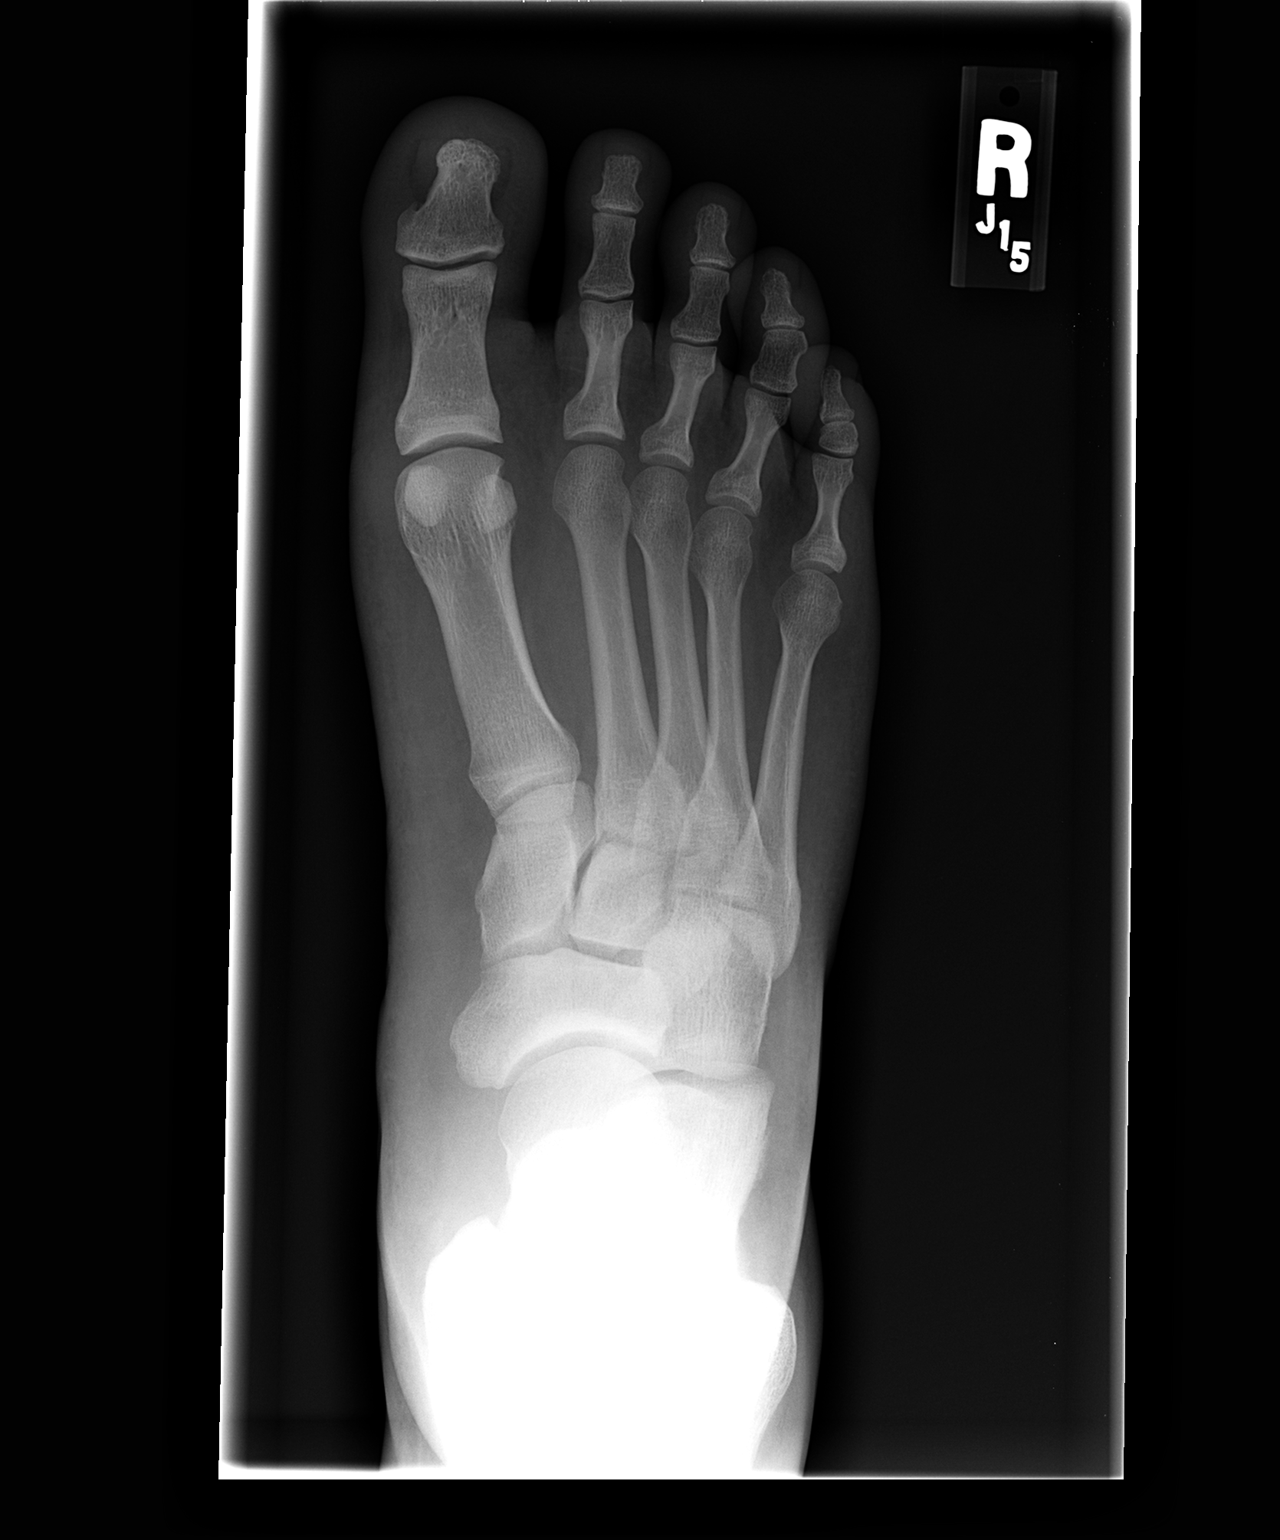

[view not recorded (2 of 3)]
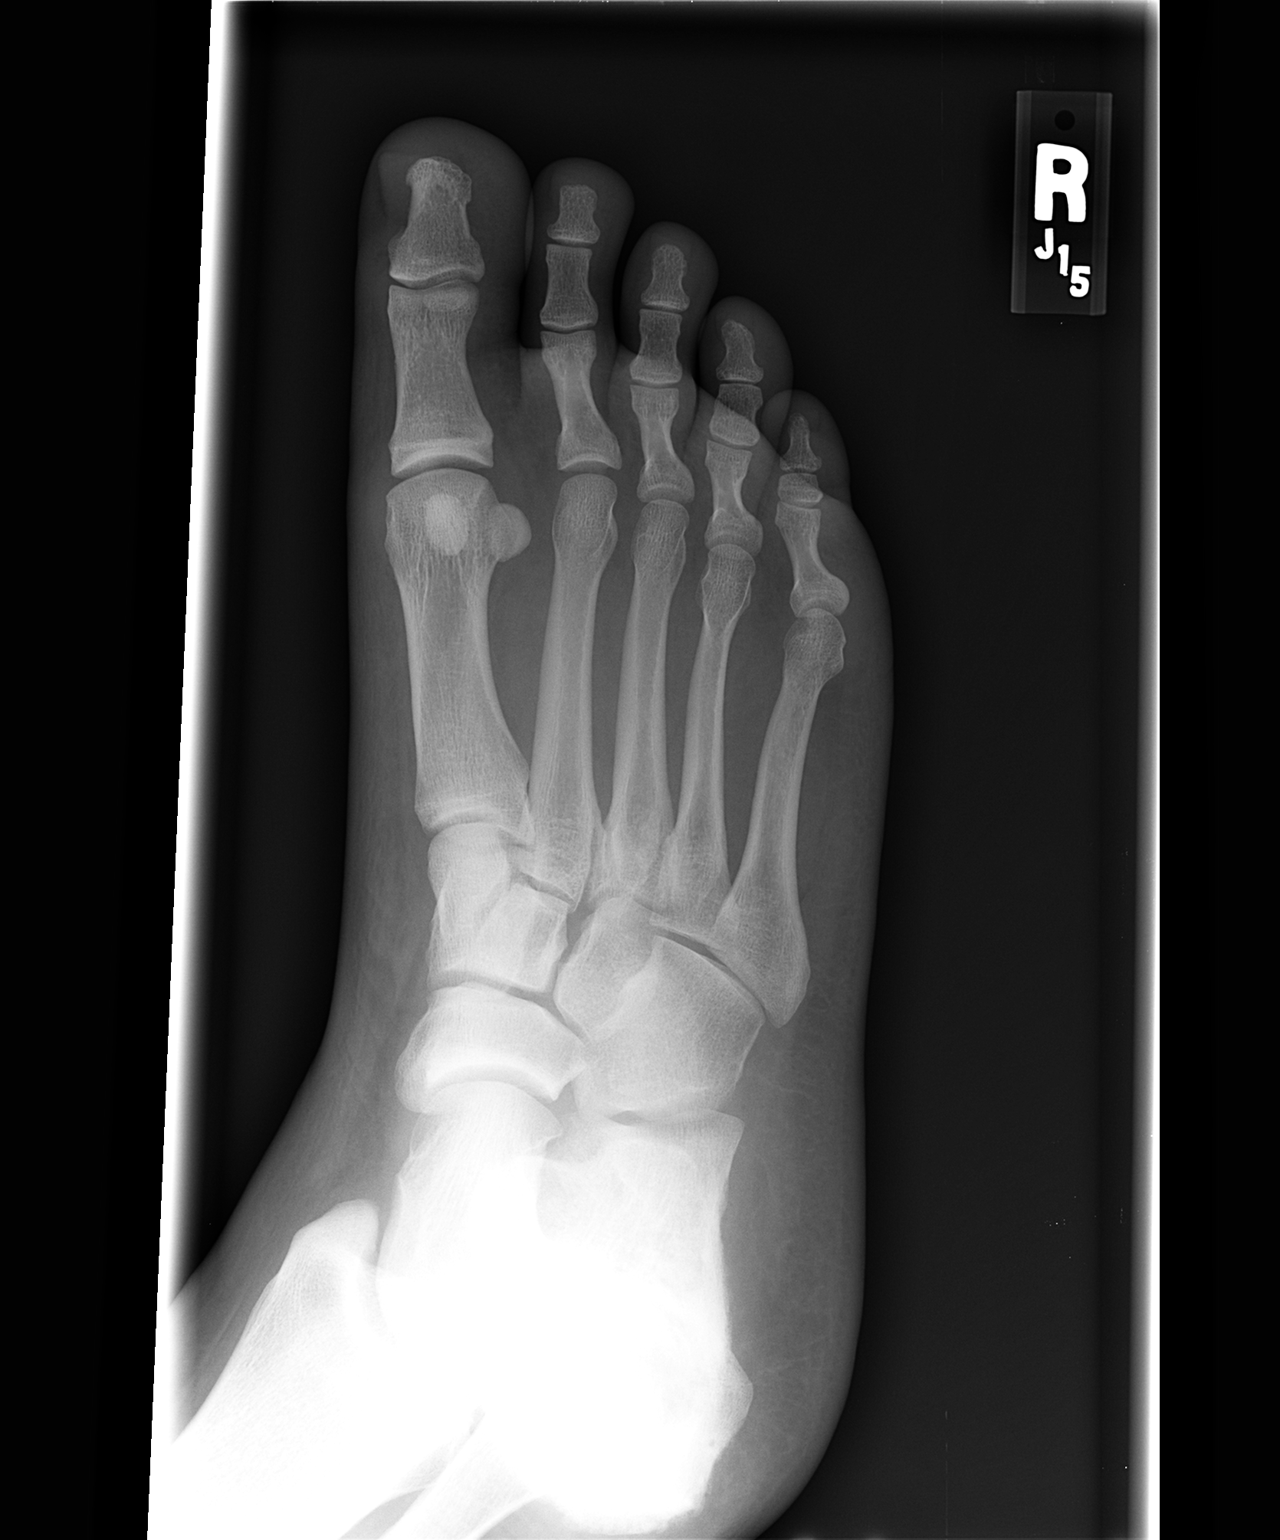

[view not recorded (3 of 3)]
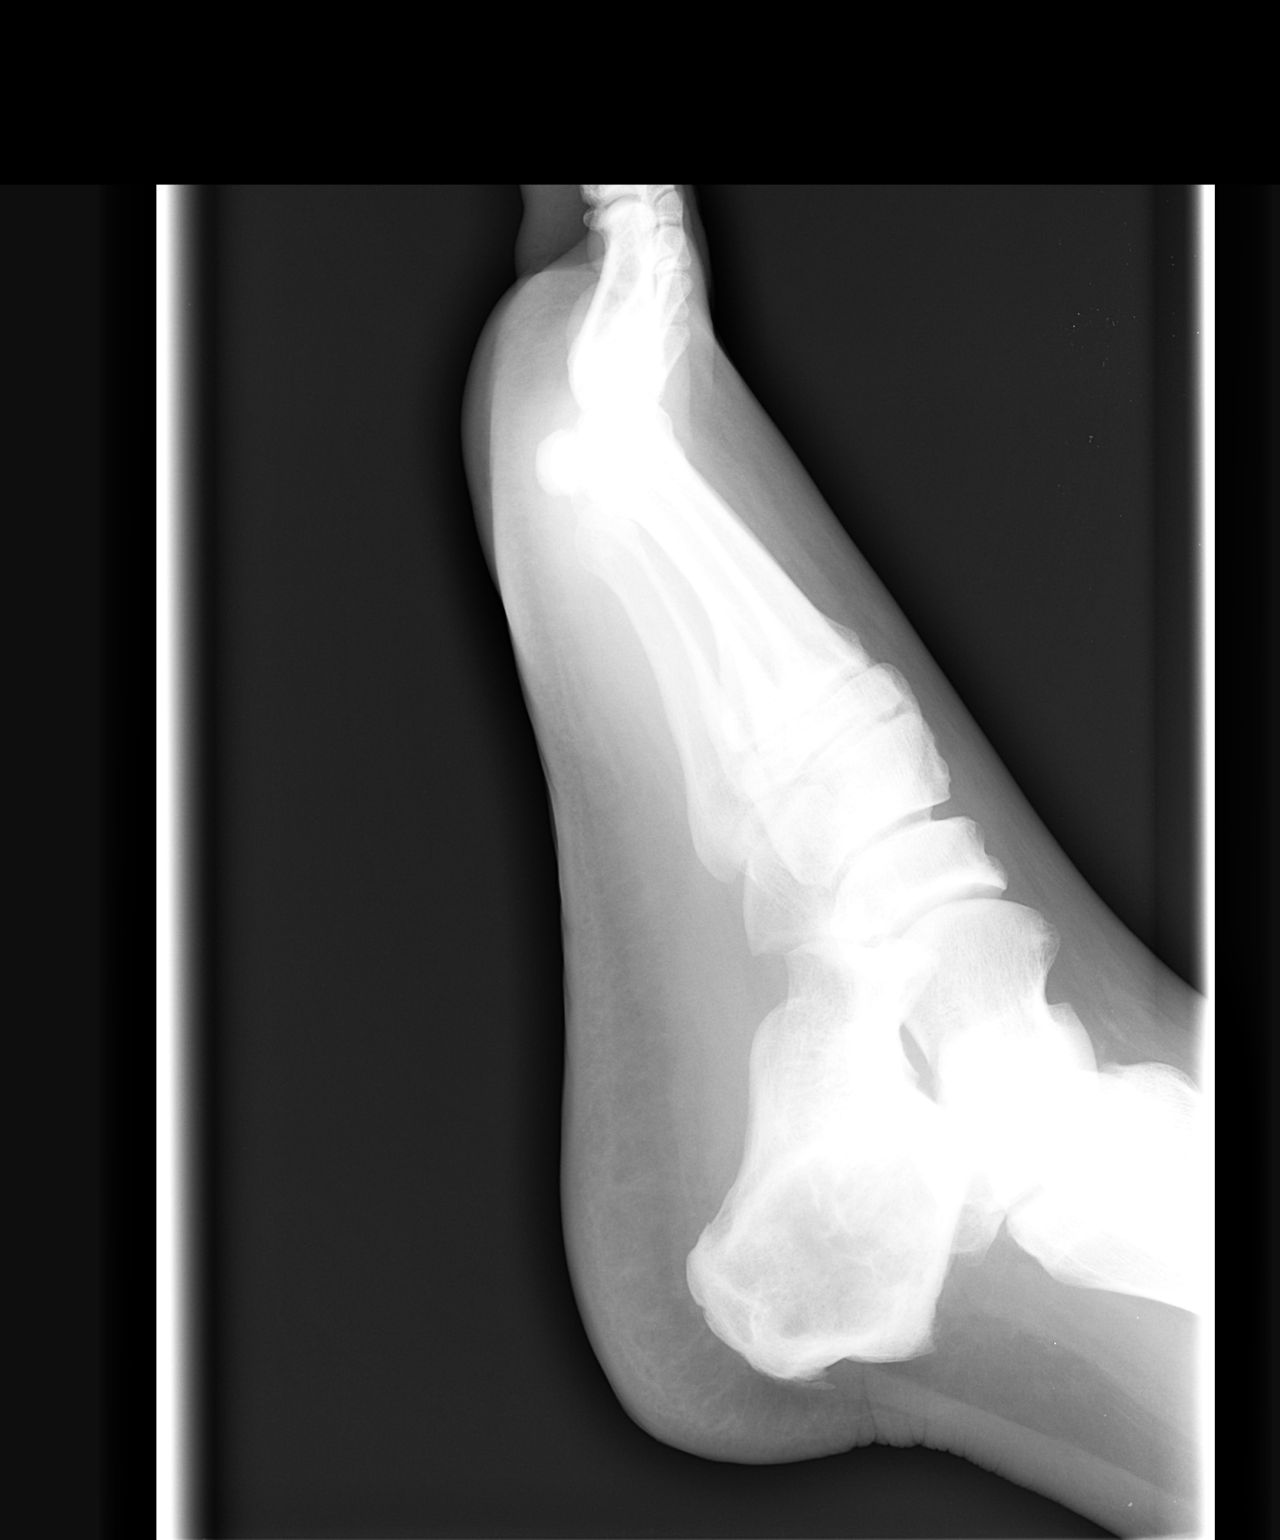

[3 of 3 positions shown; findings below may reference images not displayed]

IMPRESSION: No acute abnormality.  Lucency in posterior calcaneus may well be normal but correlate clinically.

## 2005-08-23 ENCOUNTER — Emergency Department (HOSPITAL_COMMUNITY): Admission: EM | Admit: 2005-08-23 | Discharge: 2005-08-24 | Payer: Self-pay | Admitting: Emergency Medicine

## 2006-07-10 ENCOUNTER — Emergency Department (HOSPITAL_COMMUNITY): Admission: EM | Admit: 2006-07-10 | Discharge: 2006-07-10 | Payer: Self-pay | Admitting: Emergency Medicine

## 2006-09-01 ENCOUNTER — Emergency Department (HOSPITAL_COMMUNITY): Admission: EM | Admit: 2006-09-01 | Discharge: 2006-09-01 | Payer: Self-pay | Admitting: Family Medicine

## 2006-12-20 ENCOUNTER — Emergency Department (HOSPITAL_COMMUNITY): Admission: EM | Admit: 2006-12-20 | Discharge: 2006-12-20 | Payer: Self-pay | Admitting: Family Medicine

## 2007-06-14 ENCOUNTER — Emergency Department (HOSPITAL_COMMUNITY): Admission: EM | Admit: 2007-06-14 | Discharge: 2007-06-14 | Payer: Self-pay | Admitting: Emergency Medicine

## 2007-11-26 ENCOUNTER — Emergency Department (HOSPITAL_COMMUNITY): Admission: EM | Admit: 2007-11-26 | Discharge: 2007-11-26 | Payer: Self-pay | Admitting: Emergency Medicine

## 2007-11-26 IMAGING — CT CT PELVIS W/O CM
1 of 3 series · 15 of 32 positions shown, 19 images · non-contrast
Comparison: Noncontrast abdominal and pelvic CT [DATE].

CLINICAL DATA: Left flank pain.  History of stone extraction.  Question ureteral calculus.
 ABDOMEN CT WITHOUT CONTRAST ? [DATE]:
TECHNIQUE: Multidetector CT imaging of the abdomen was performed following the standard protocol without IV contrast.
TECHNIQUE: Multidetector CT imaging of the pelvis was performed following the standard protocol without IV contrast.

[Series 2: 160 stone 5.0 b40f st · axial · 0.92mm/px · z∈[-500,-124]mm · 15 of 104 slices shown, 19 images]
[im 5/104  soft-tissue]
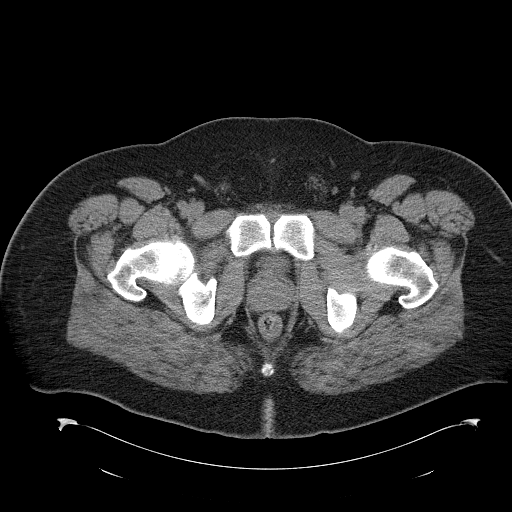
[im 5/104  bone]
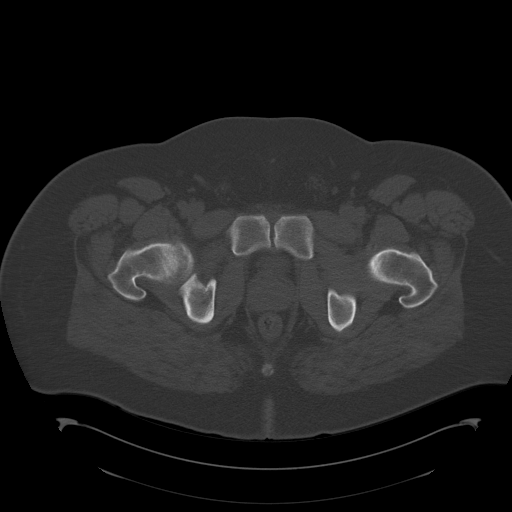
[im 13/104  soft-tissue]
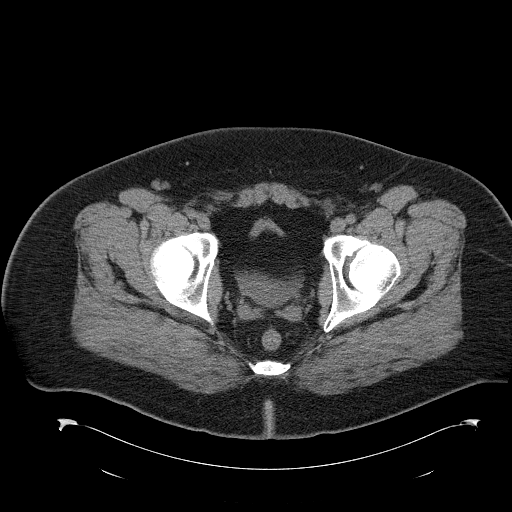
[im 21/104  soft-tissue]
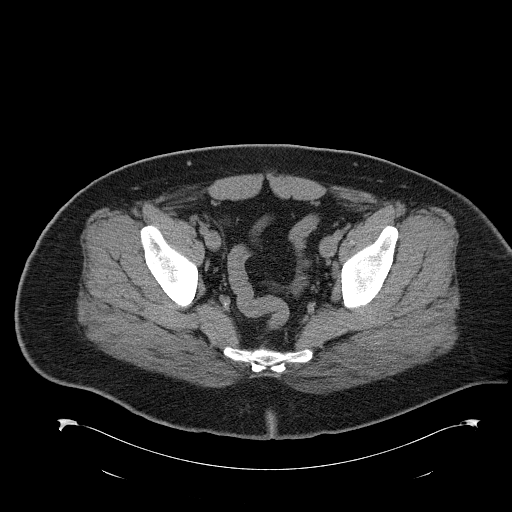
[im 29/104  soft-tissue]
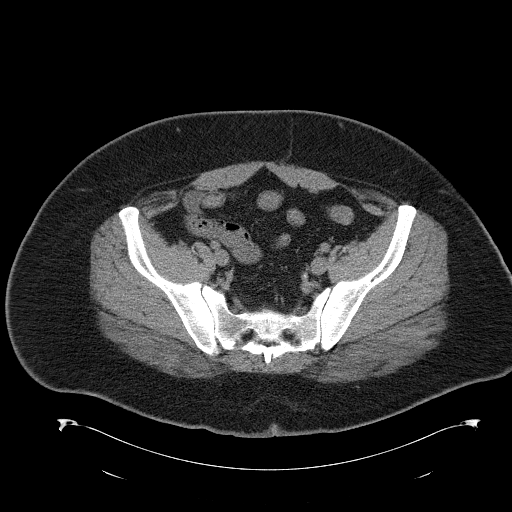
[im 38/104  soft-tissue]
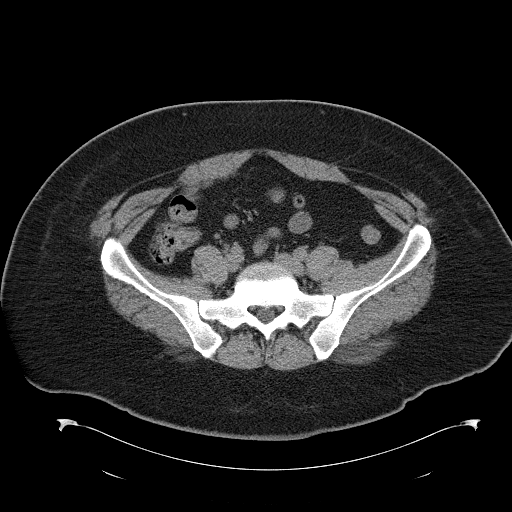
[im 46/104  soft-tissue]
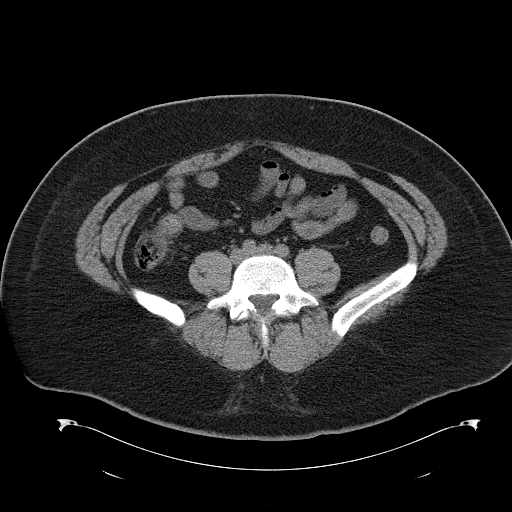
[im 54/104  soft-tissue]
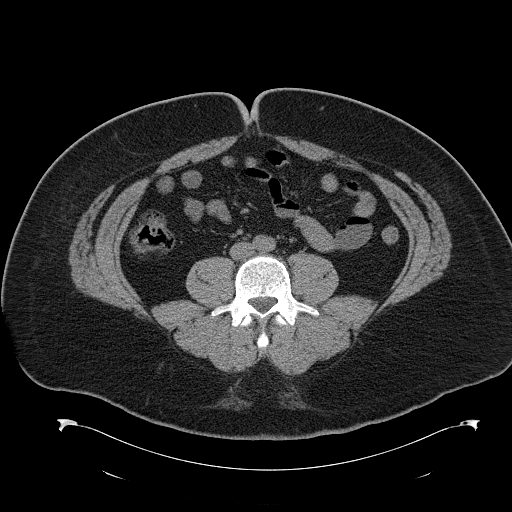
[im 58/104  soft-tissue]
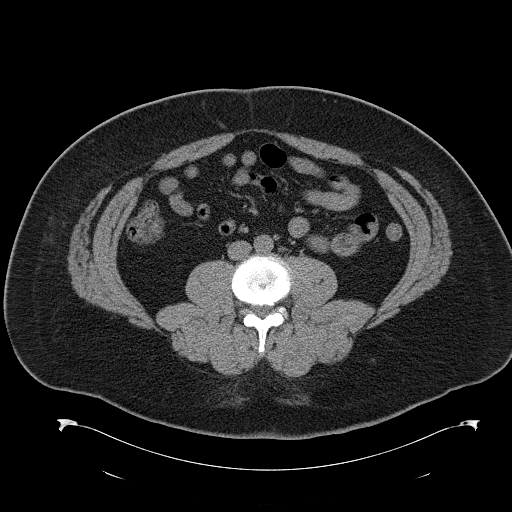
[im 66/104  soft-tissue]
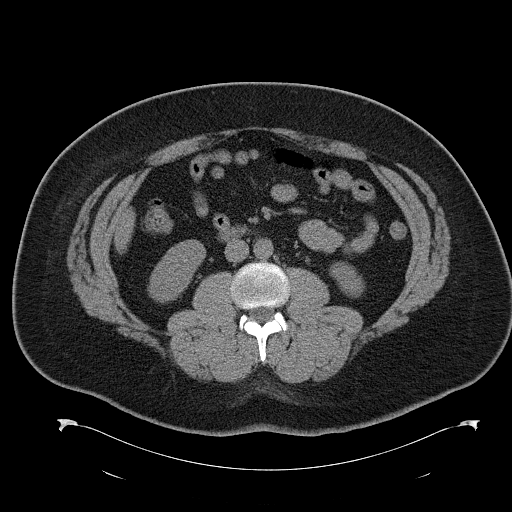
[im 66/104  bone]
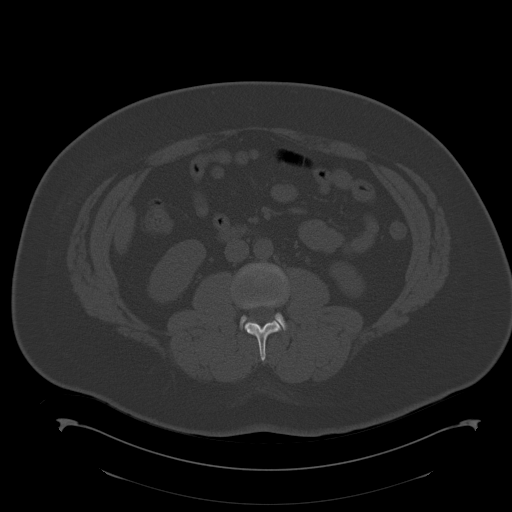
[im 75/104  soft-tissue]
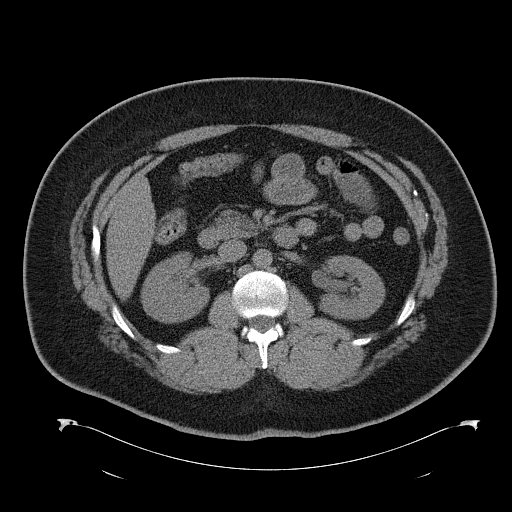
[im 83/104  soft-tissue]
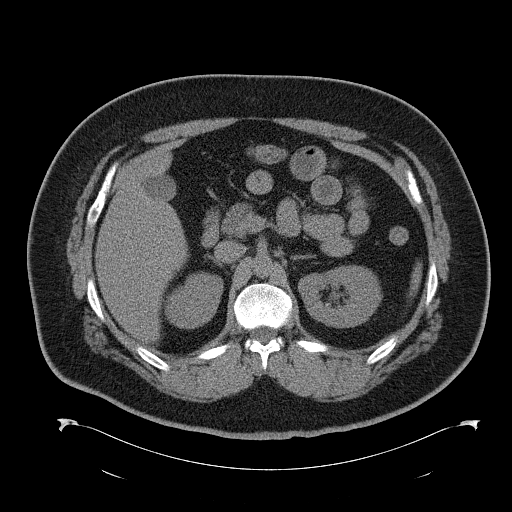
[im 87/104  lung]
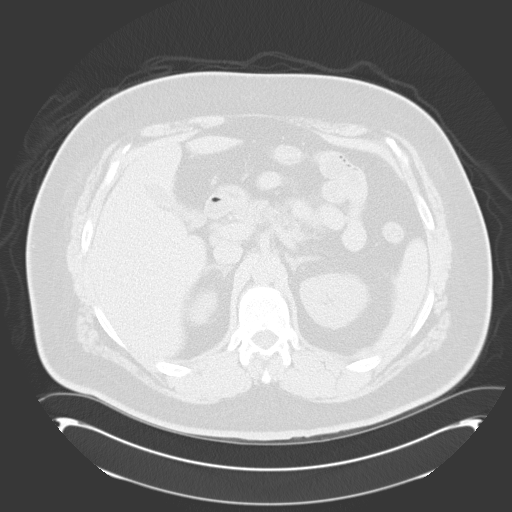
[im 91/104  soft-tissue]
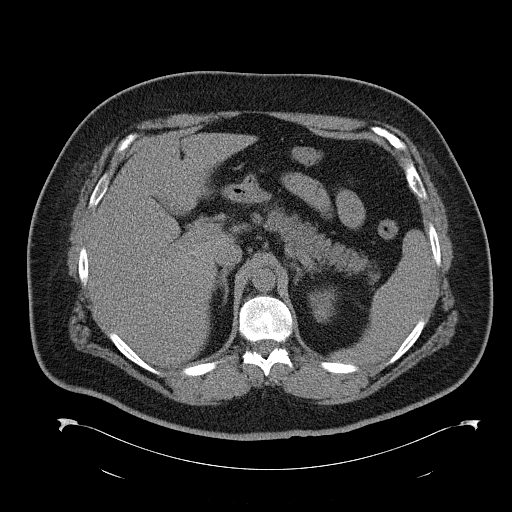
[im 91/104  lung]
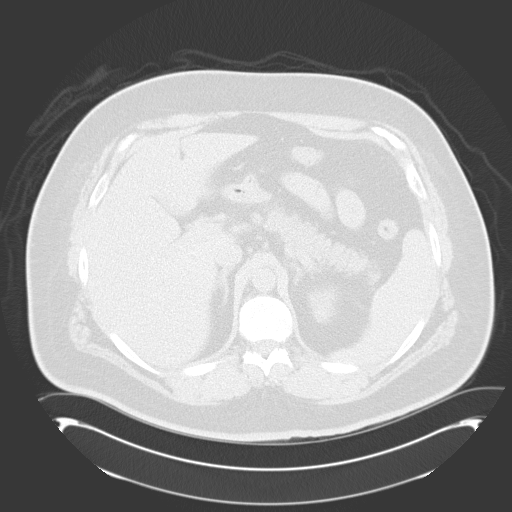
[im 95/104  lung]
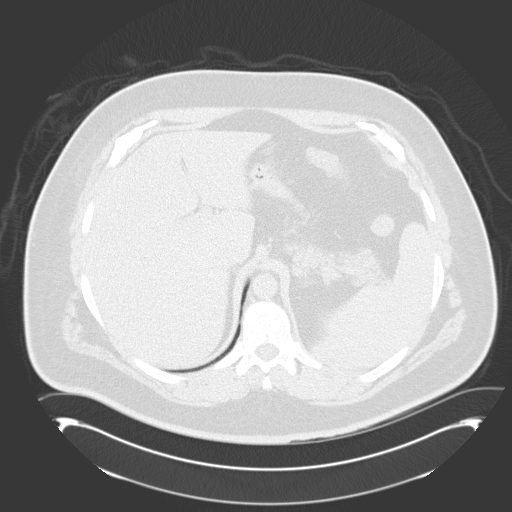
[im 99/104  soft-tissue]
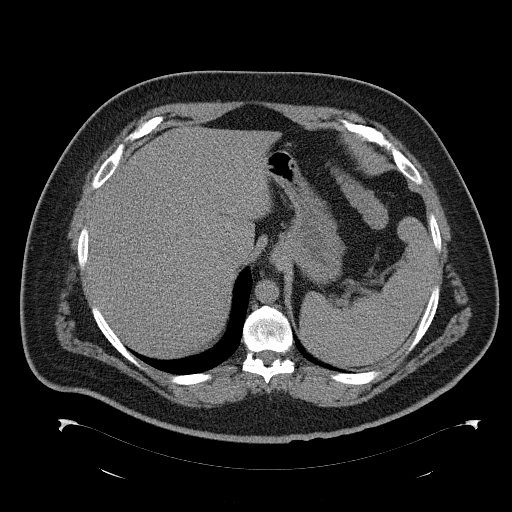
[im 99/104  lung]
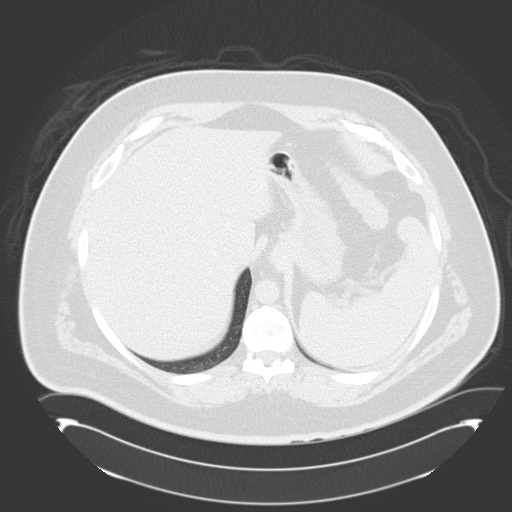

[15 of 32 positions shown; findings below may reference images not displayed]

FINDINGS: There is mild left-sided hydronephrosis and hydroureter.  There is a small obstructing calculus in the proximal left ureter, measuring 2 mm in diameter on image #37.  This is not visible on the scout image, but overlies the left L2 transverse process.  There are some small nonobstructing caliceal calculi in both kidneys. There is no right-sided hydronephrosis.  No other ureteral calculi are demonstrated.
 There is some fatty infiltration of the liver and a small hiatal hernia. The remainder of the visualized abdomen otherwise appears unremarkable as imaged in the unenhanced state, but as such is suboptimally evaluated.
IMPRESSION: 1.  Obstructing 2 mm calculus in the proximal left ureter with mild hydronephrosis.
 2.  Small nonobstructing renal calculi bilaterally.
 PELVIS CT WITHOUT CONTRAST ? [DATE]:
FINDINGS: Distally, the ureters are normal in caliber. There are no distal ureteral or bladder calculi.  There is no evidence of a pelvic mass, fluid collection, or inflammatory process.  Bilateral L5 pars defects are noted with a slight resulting anterolisthesis at L5-S1.
IMPRESSION: No acute pelvic findings.  Bilateral L5 pars defects.  See abdominal findings above.

## 2008-06-02 ENCOUNTER — Emergency Department (HOSPITAL_COMMUNITY): Admission: EM | Admit: 2008-06-02 | Discharge: 2008-06-02 | Payer: Self-pay | Admitting: Emergency Medicine

## 2008-06-02 IMAGING — CR DG CHEST 2V
2 series · 2 of 2 positions shown · non-contrast
Comparison: None

CLINICAL DATA: Syncope.  Fever.

CHEST - 2 VIEW

[w chest pa]
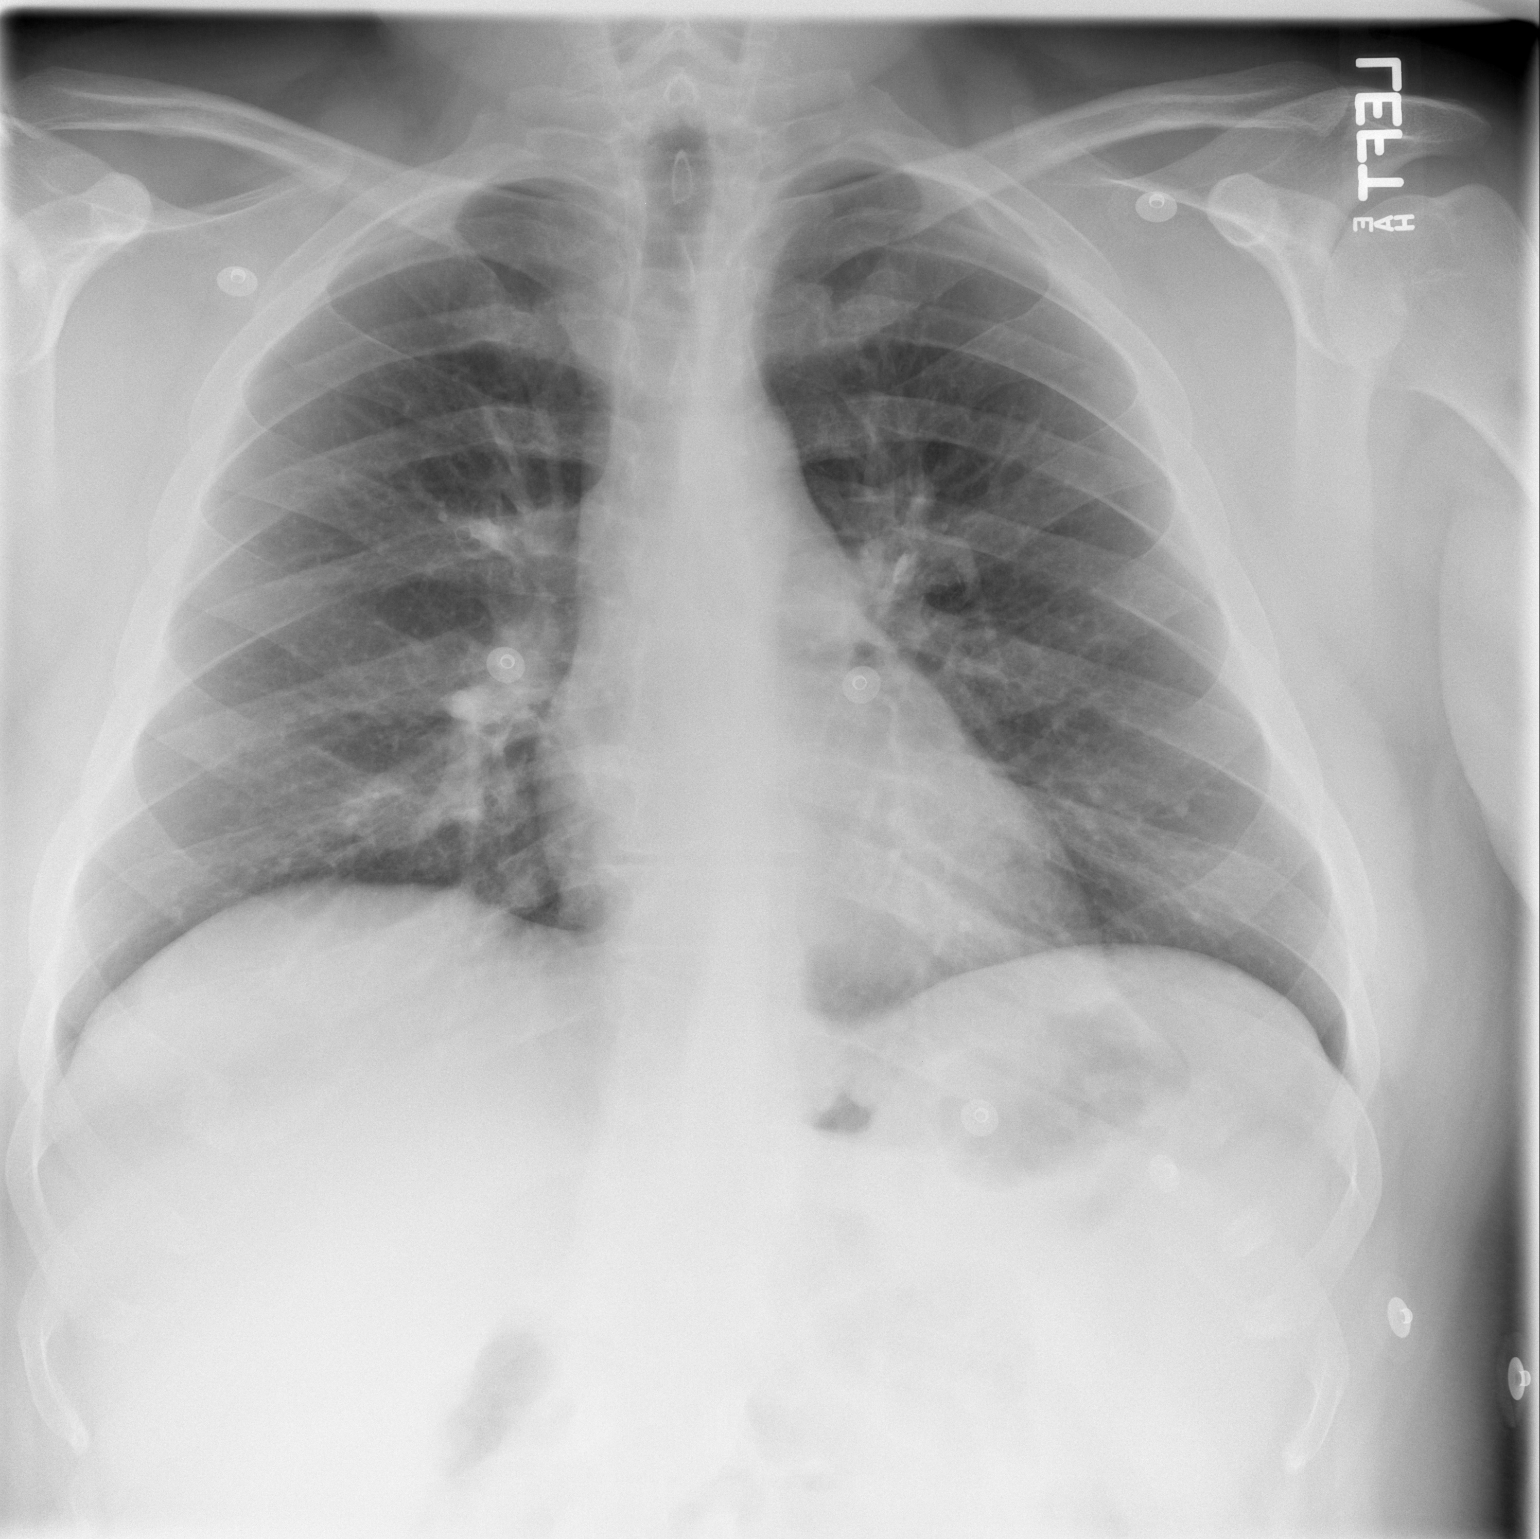

[w chest lat *]
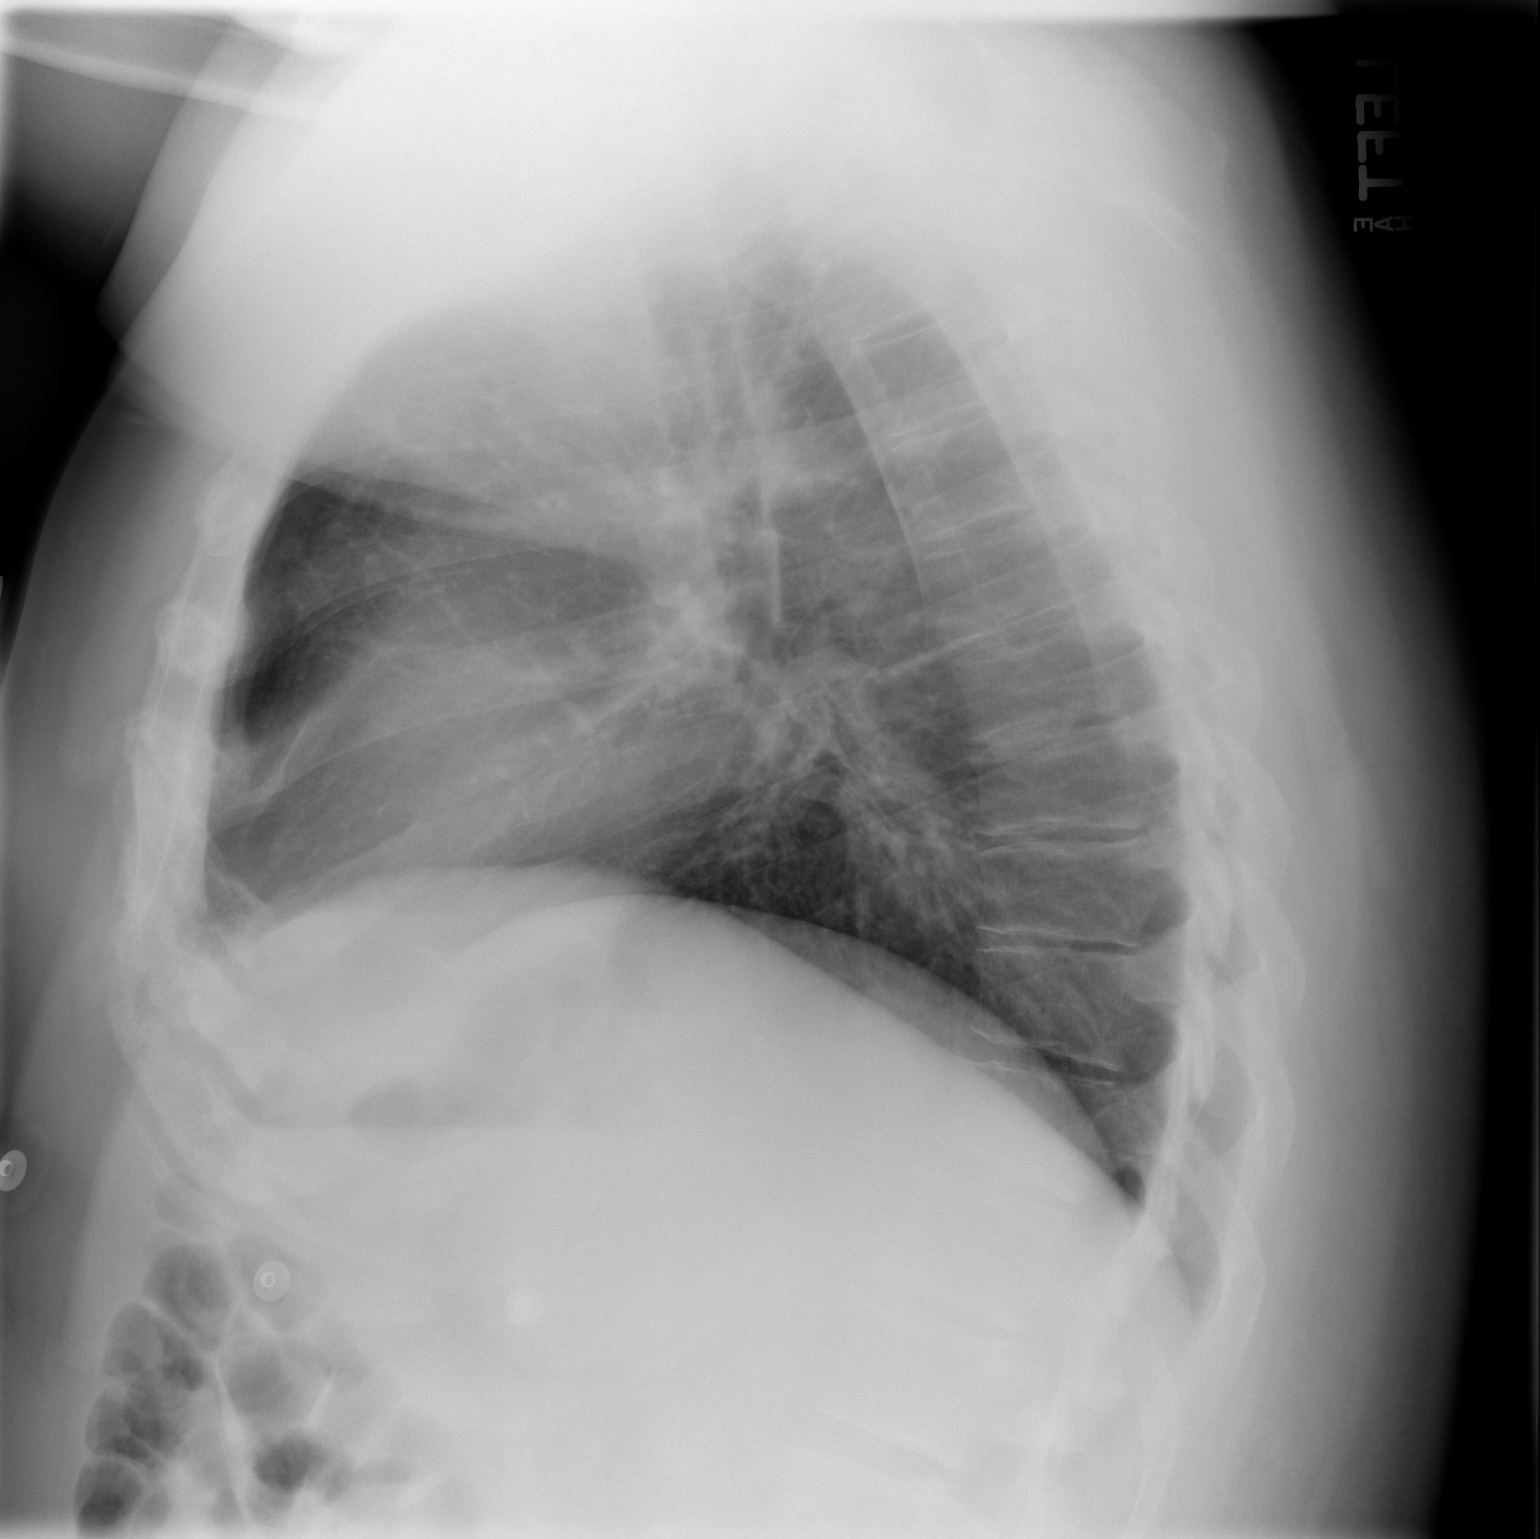

[2 of 2 positions shown; findings below may reference images not displayed]

FINDINGS: Cardiac and mediastinal contours are normal.  The
vascularity is normal and  the lungs are clear.  There is no
infiltrate or effusion.
IMPRESSION: No active cardiopulmonary disease.

## 2008-06-02 IMAGING — CT CT HEAD W/O CM
1 of 2 series · 16 of 30 positions shown, 20 images · non-contrast
Comparison: None

CLINICAL DATA: THE PATIENT FELL. FOREHEAD LACERATION. SYNCOPE.
HYPERTENSION

CT HEAD WITHOUT CONTRAST
TECHNIQUE: Contiguous axial images were obtained from the base of
the skull through the vertex without contrast

[Series 3: recon 2: brain · axial · 0.47mm/px · z∈[+117,+256]mm · 16 of 56 slices shown, 20 images]
[im 3/56  brain]
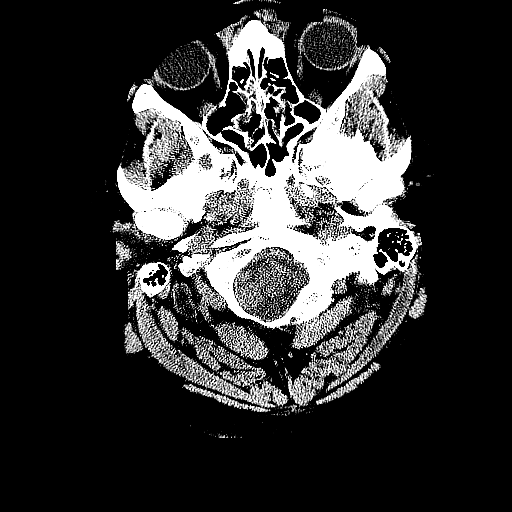
[im 3/56  bone]
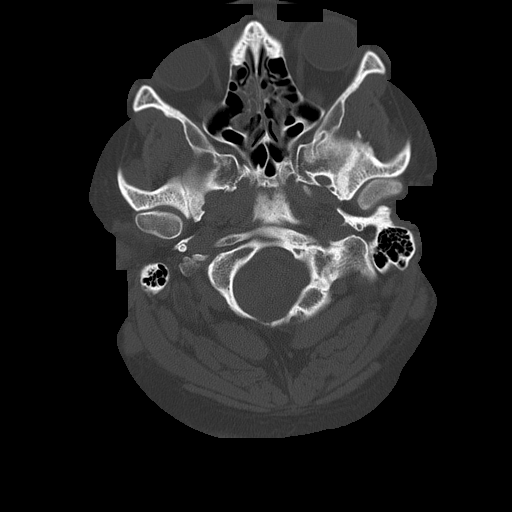
[im 6/56  brain]
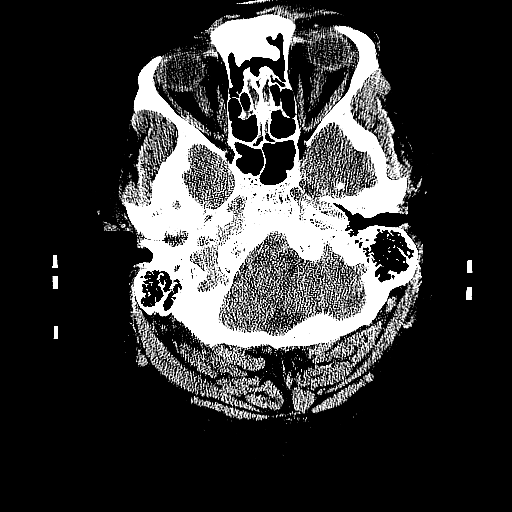
[im 9/56  brain]
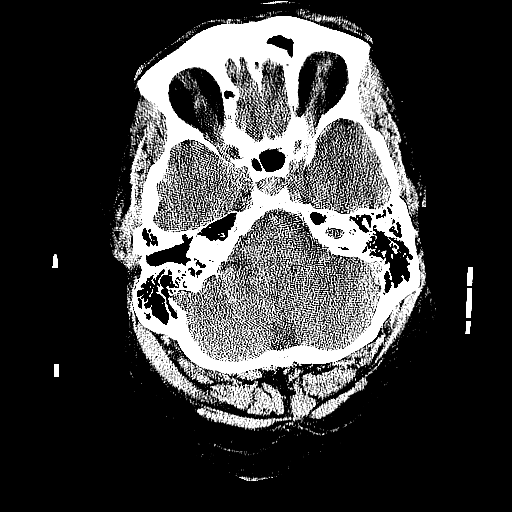
[im 12/56  brain]
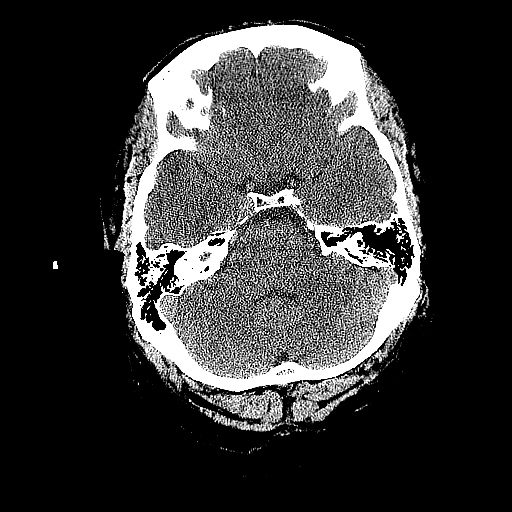
[im 18/56  brain]
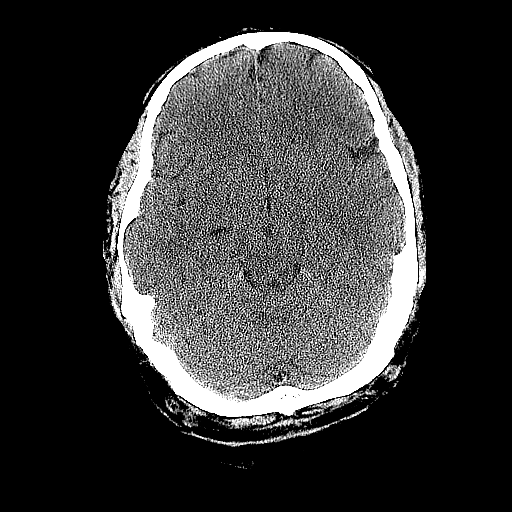
[im 18/56  bone]
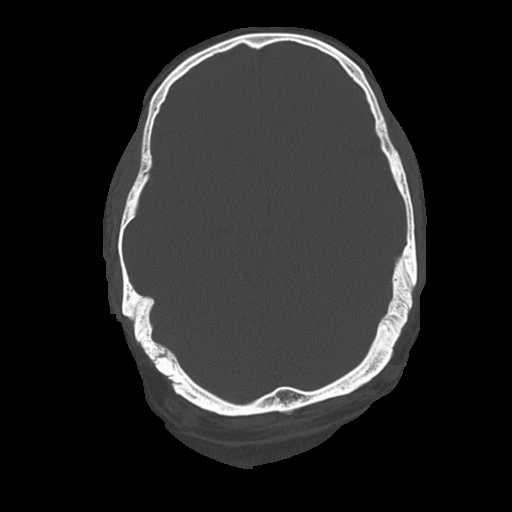
[im 21/56  brain]
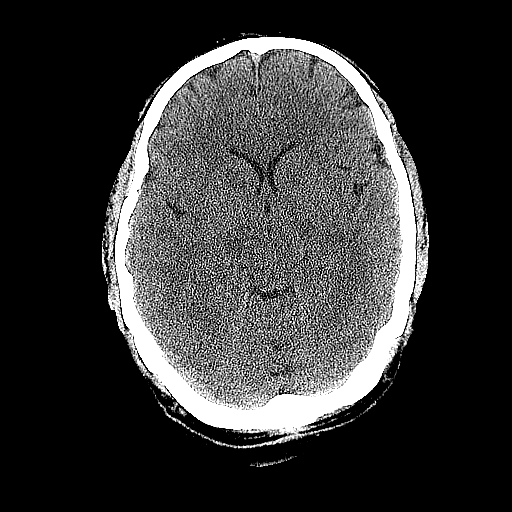
[im 24/56  brain]
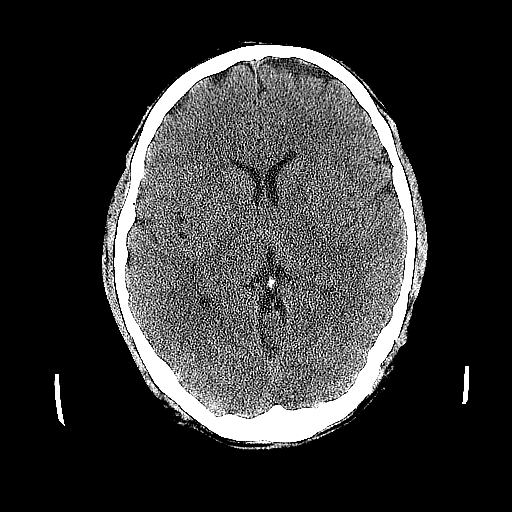
[im 27/56  brain]
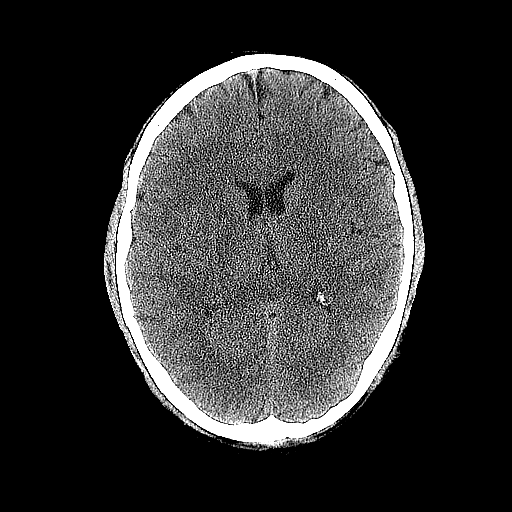
[im 29/56  brain]
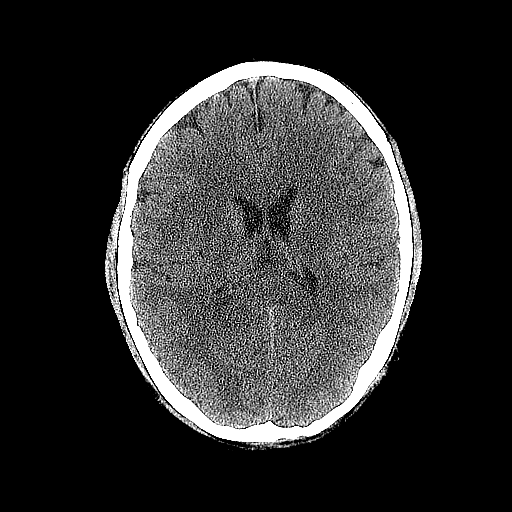
[im 29/56  bone]
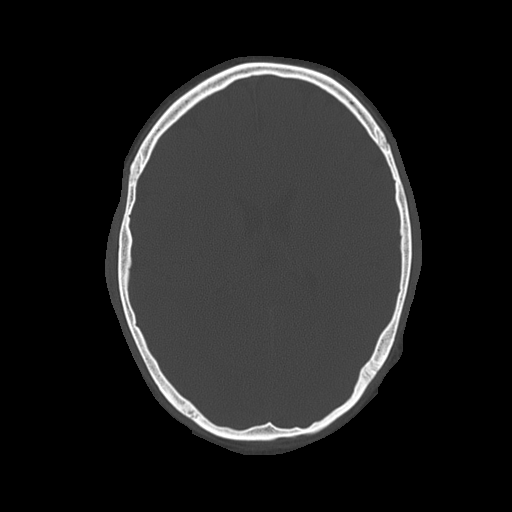
[im 32/56  brain]
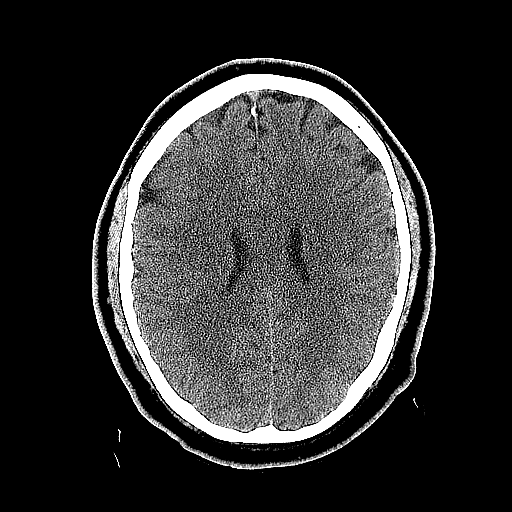
[im 35/56  brain]
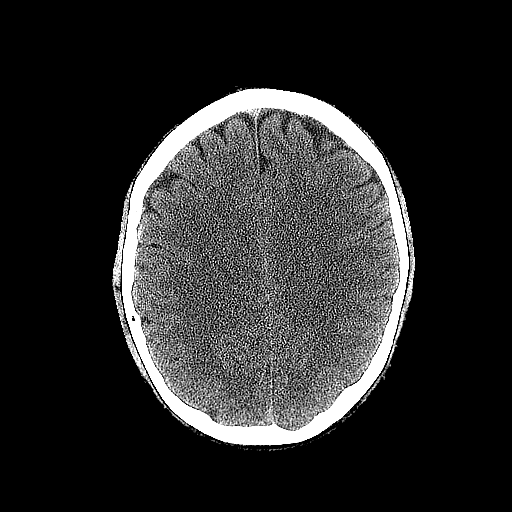
[im 38/56  brain]
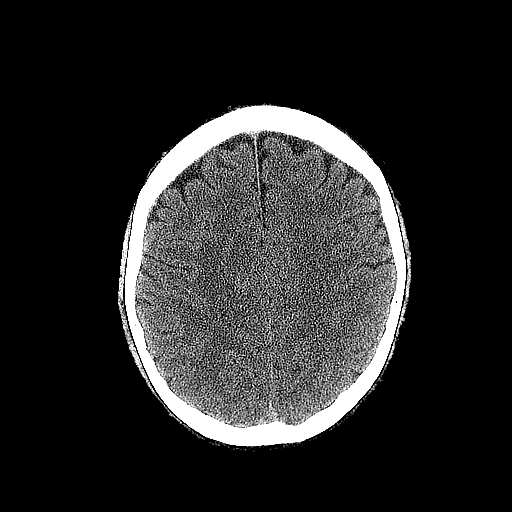
[im 44/56  brain]
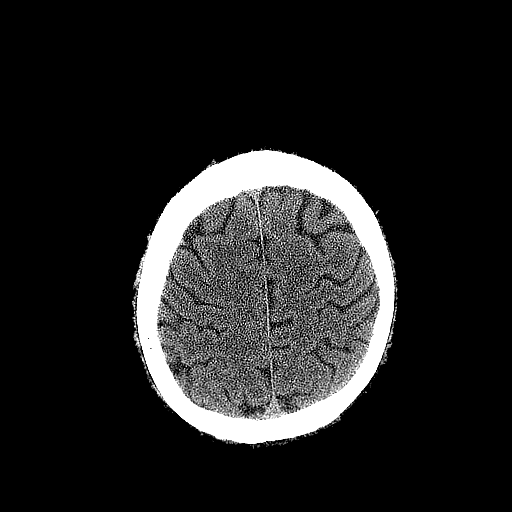
[im 44/56  bone]
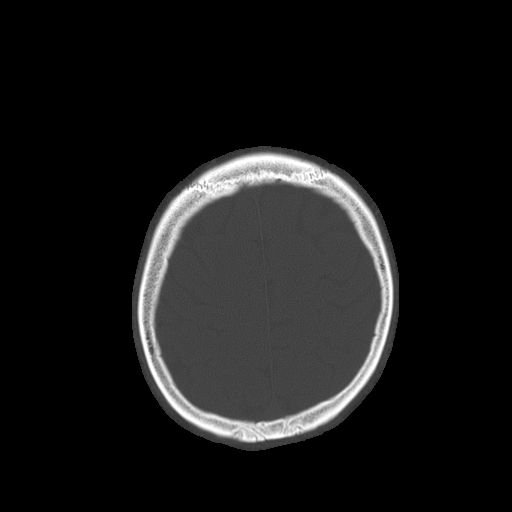
[im 47/56  brain]
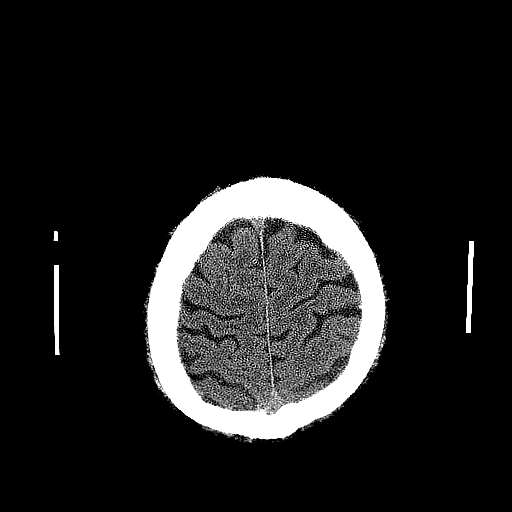
[im 50/56  brain]
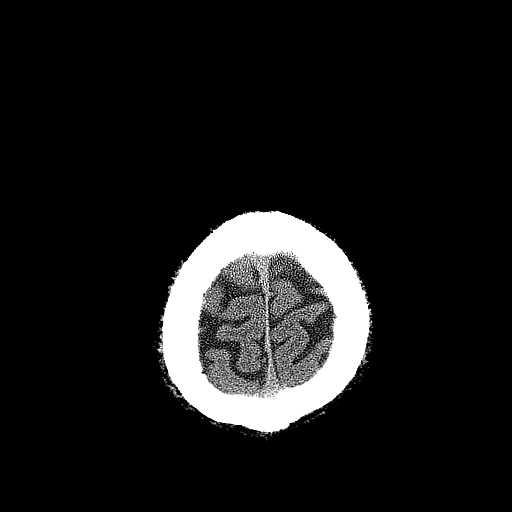
[im 53/56  brain]
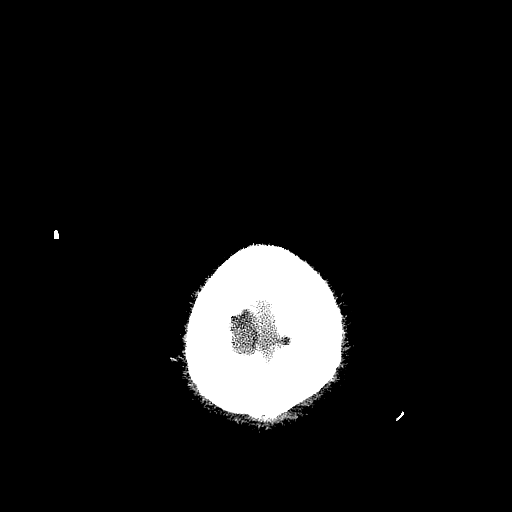

[16 of 30 positions shown; findings below may reference images not displayed]

FINDINGS: The brain has a normal appearance without evidence for
hemorrhage, acute infarction, hydrocephalus, or mass lesion.  There
is no extra axial fluid collection.  The skull and paranasal
sinuses are normal.
IMPRESSION: Normal CT of the head without contrast.

## 2008-06-06 ENCOUNTER — Emergency Department (HOSPITAL_COMMUNITY): Admission: EM | Admit: 2008-06-06 | Discharge: 2008-06-06 | Payer: Self-pay | Admitting: Family Medicine

## 2008-11-17 ENCOUNTER — Emergency Department (HOSPITAL_COMMUNITY): Admission: EM | Admit: 2008-11-17 | Discharge: 2008-11-17 | Payer: Self-pay | Admitting: Emergency Medicine

## 2008-11-17 IMAGING — CR DG CHEST 2V
2 series · 2 of 2 positions shown · non-contrast
Comparison: [DATE]

CLINICAL DATA: Chest pain, shortness of breath and nausea.

CHEST - 2 VIEW

[w chest pa]
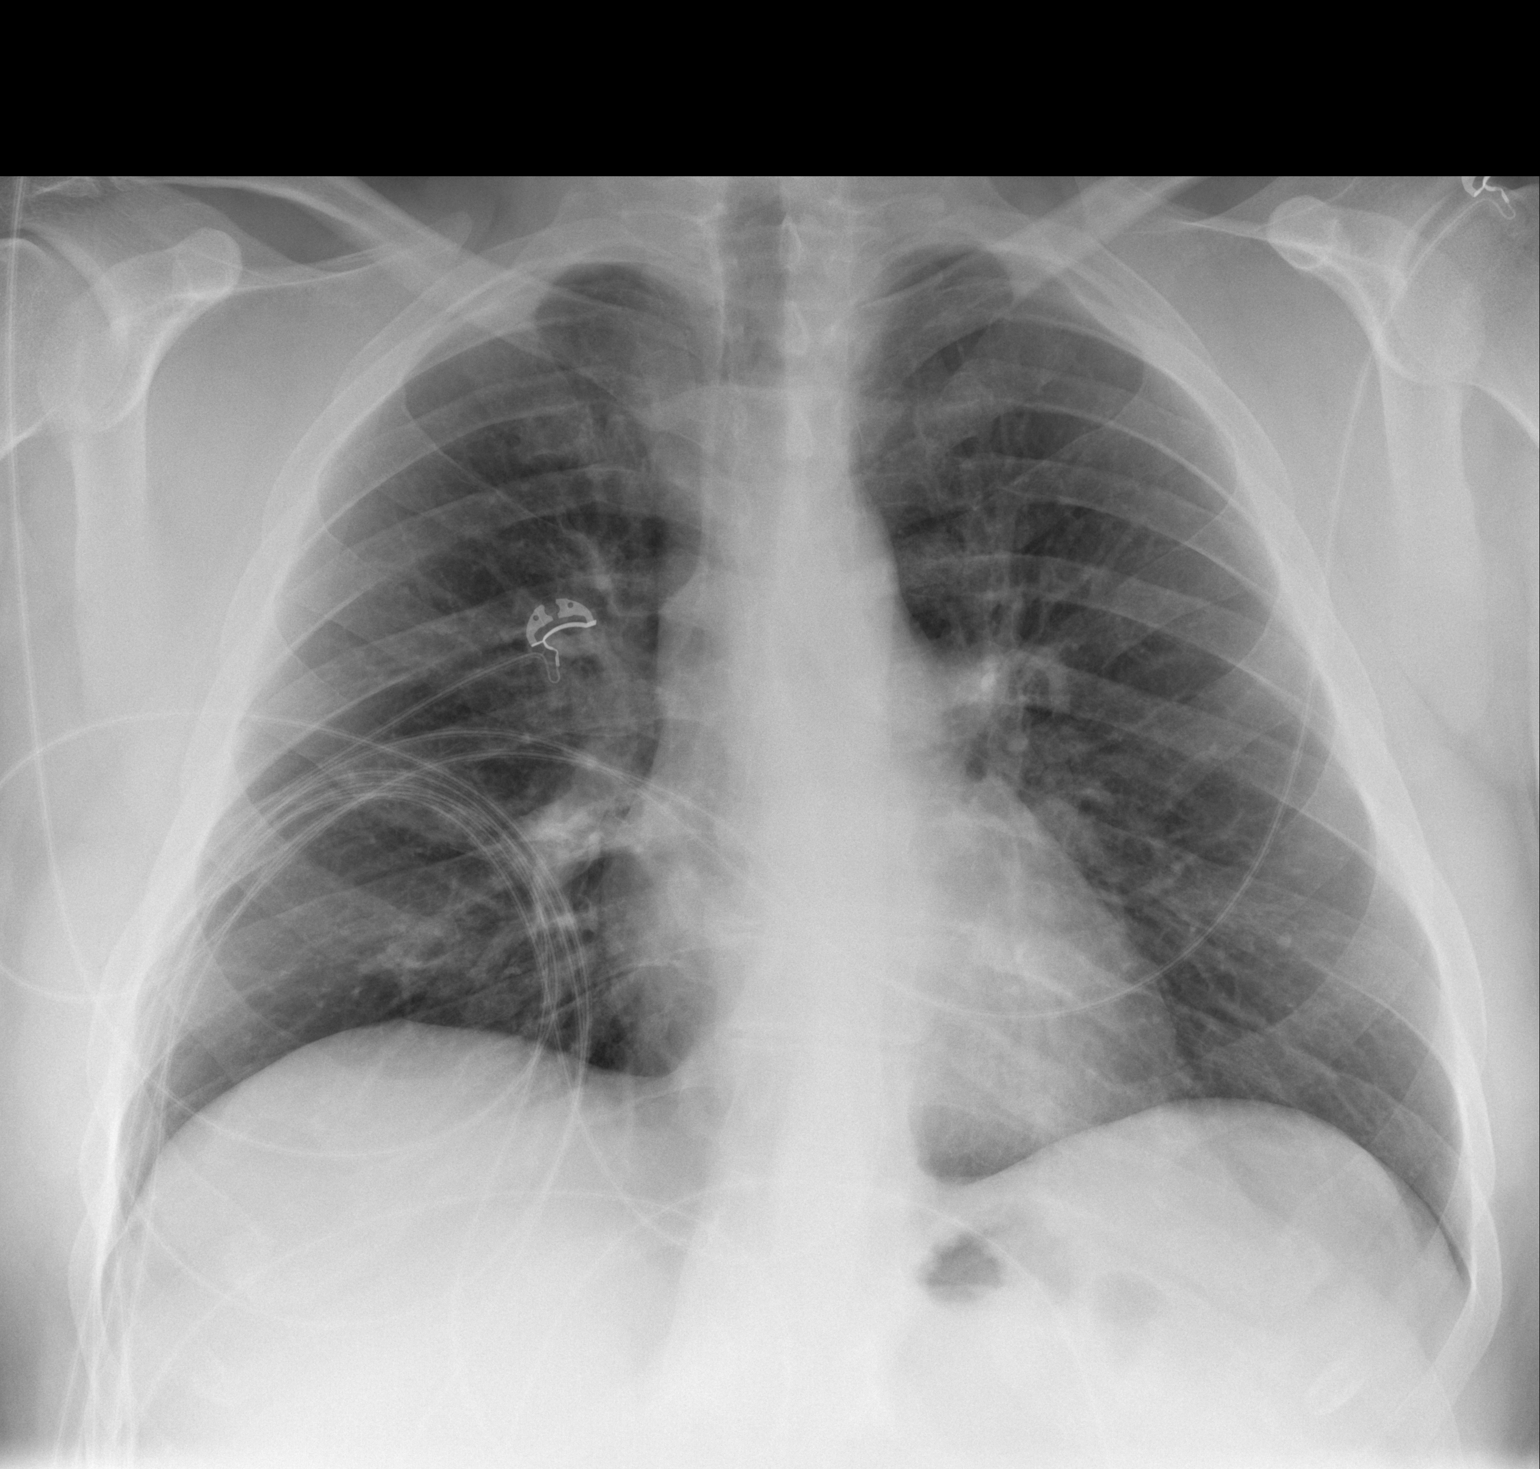

[w chest lat *]
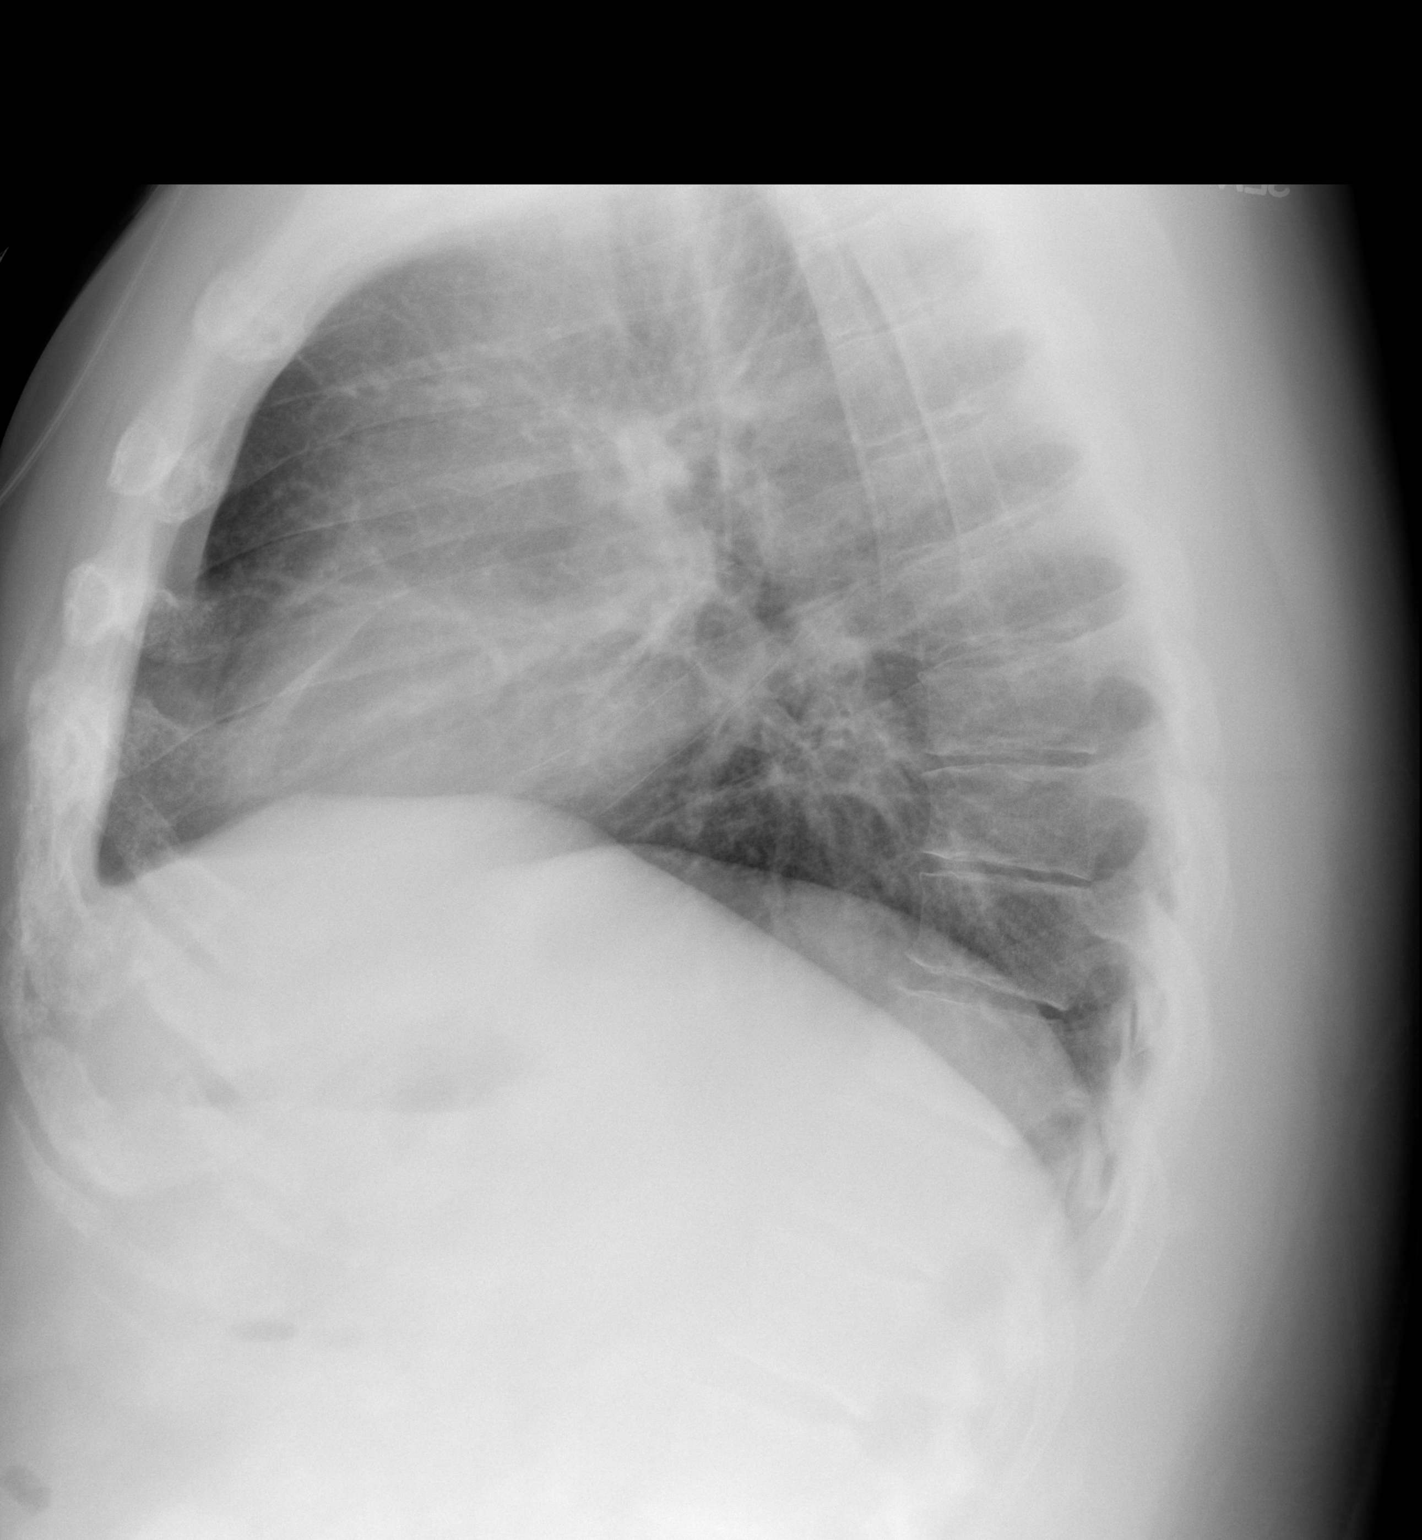

[2 of 2 positions shown; findings below may reference images not displayed]

FINDINGS: Trachea is midline.  Heart size normal.  Lungs are clear.
No pleural fluid.
IMPRESSION: No acute findings.

## 2009-03-10 ENCOUNTER — Emergency Department (HOSPITAL_COMMUNITY): Admission: EM | Admit: 2009-03-10 | Discharge: 2009-03-10 | Payer: Self-pay | Admitting: Emergency Medicine

## 2009-03-10 IMAGING — CT CT ABDOMEN W/O CM
2 of 4 series · 17 of 46 positions shown, 19 images · non-contrast
Comparison: [DATE].

CT ABDOMEN

CLINICAL DATA: Left flank pain, nausea and vomiting.  Hematuria.
Previous nephrolithiasis.

CT OF THE ABDOMEN AND PELVIS WITHOUT CONTRAST (CT UROGRAM)
TECHNIQUE: Multidetector CT imaging was performed through the
abdomen and pelvis to include the urinary tract.

[Series 2: >200 lbs-stone 5.0 b31f · axial · 0.77mm/px · z∈[+864,+1344]mm · 14 of 106 slices shown, 16 images]
[im 5/106  soft-tissue]
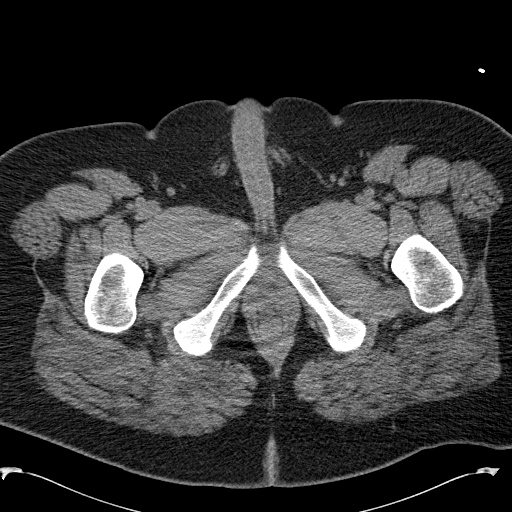
[im 5/106  bone]
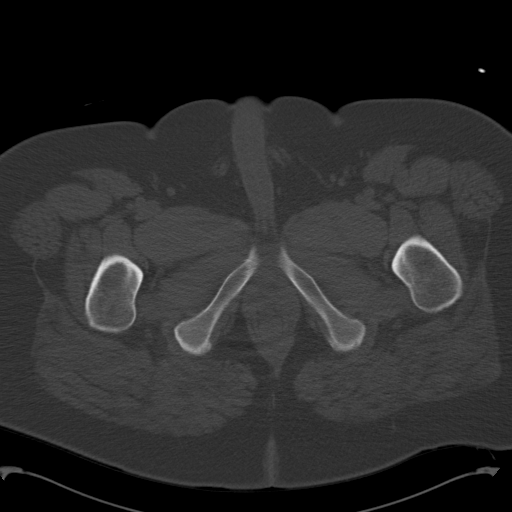
[im 13/106  soft-tissue]
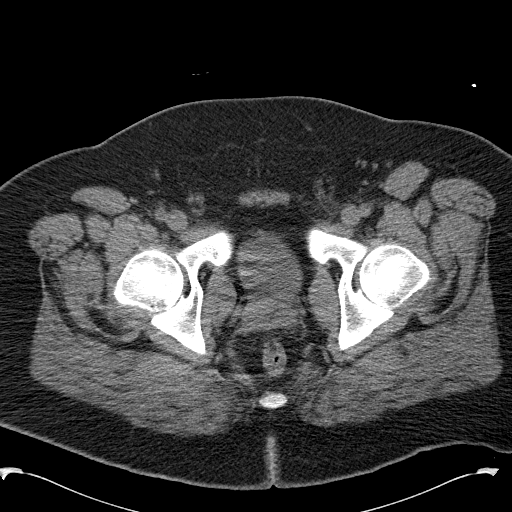
[im 22/106  soft-tissue]
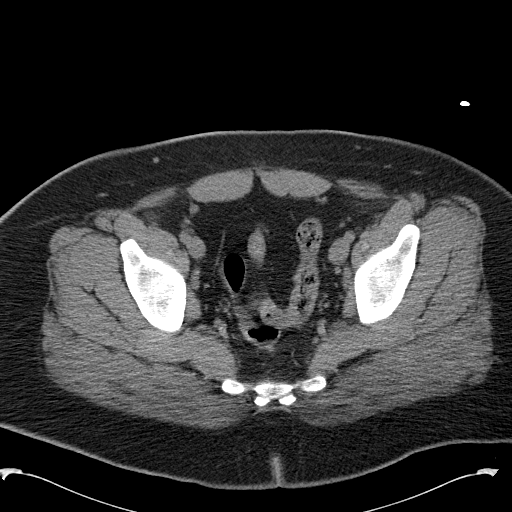
[im 30/106  soft-tissue]
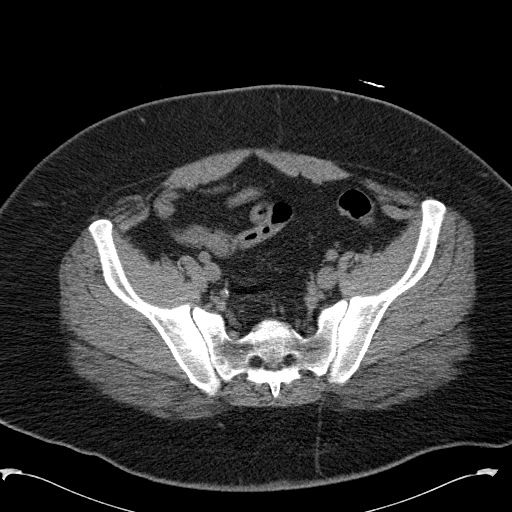
[im 34/106  soft-tissue]
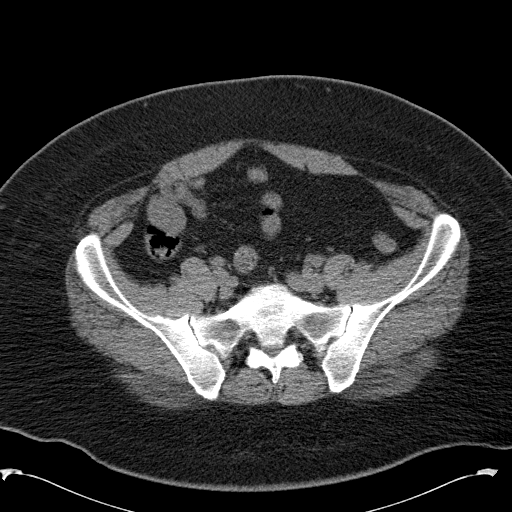
[im 43/106  soft-tissue]
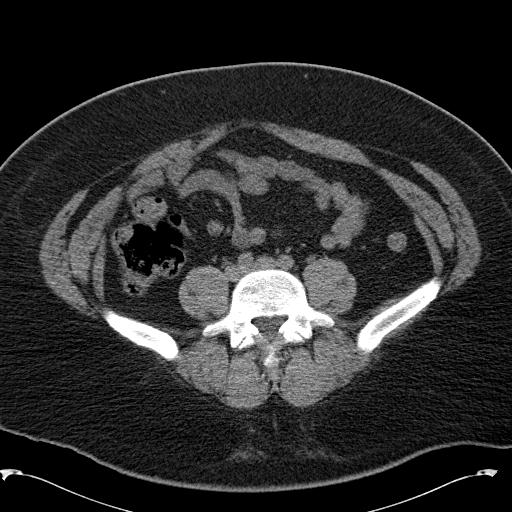
[im 51/106  soft-tissue]
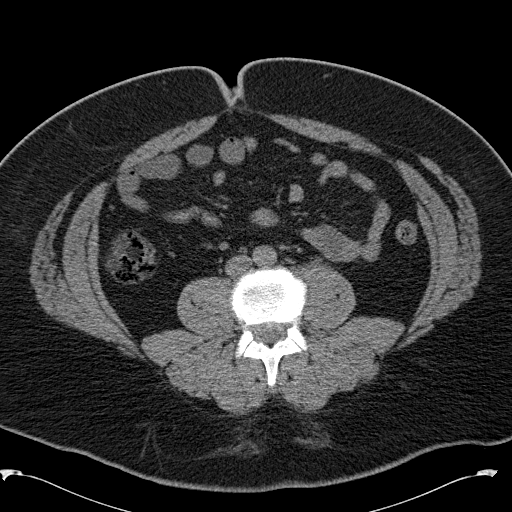
[im 55/106  soft-tissue]
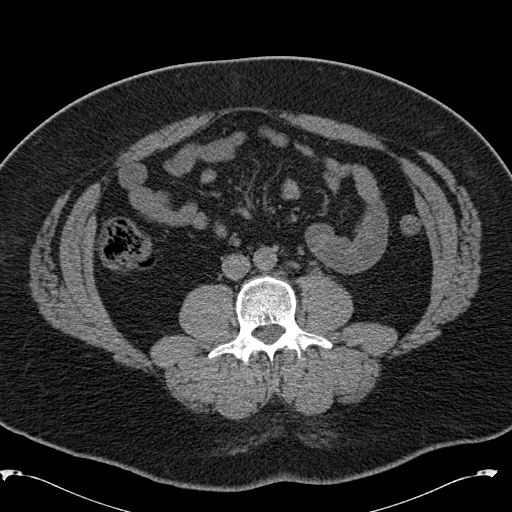
[im 64/106  soft-tissue]
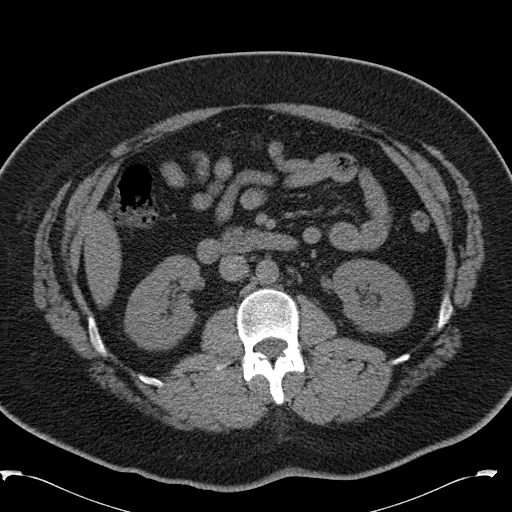
[im 64/106  bone]
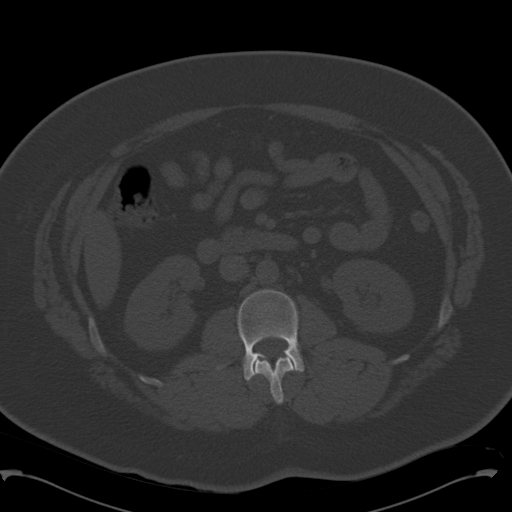
[im 72/106  soft-tissue]
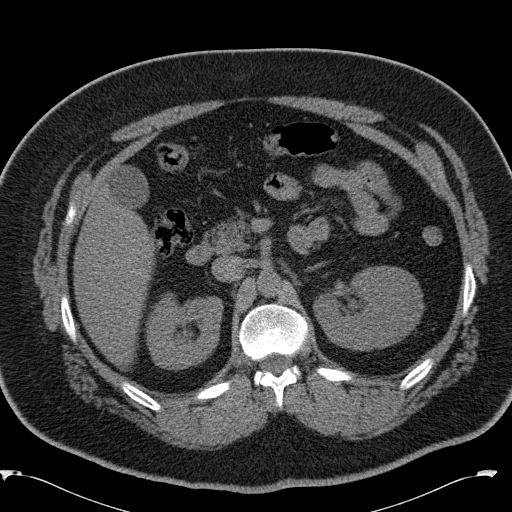
[im 80/106  soft-tissue]
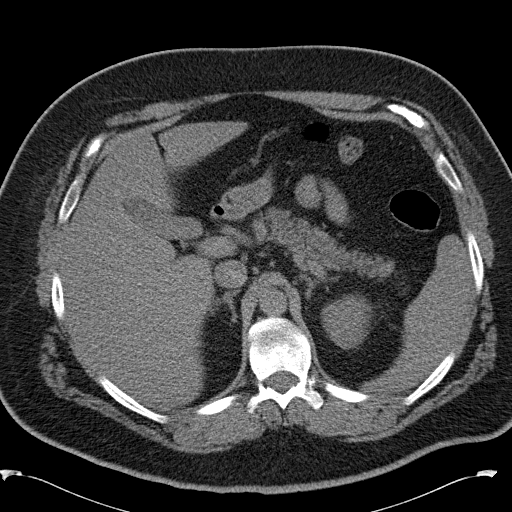
[im 85/106  soft-tissue]
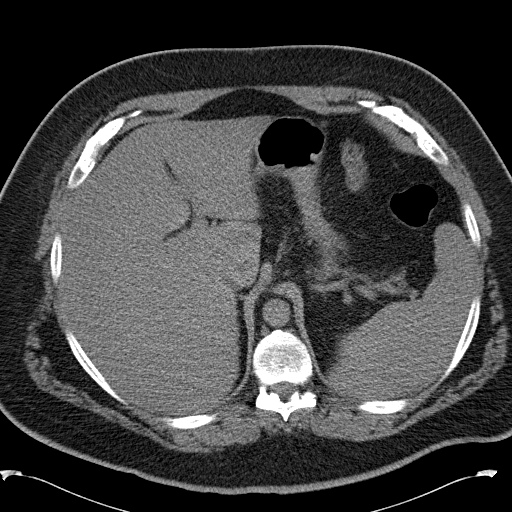
[im 93/106  soft-tissue]
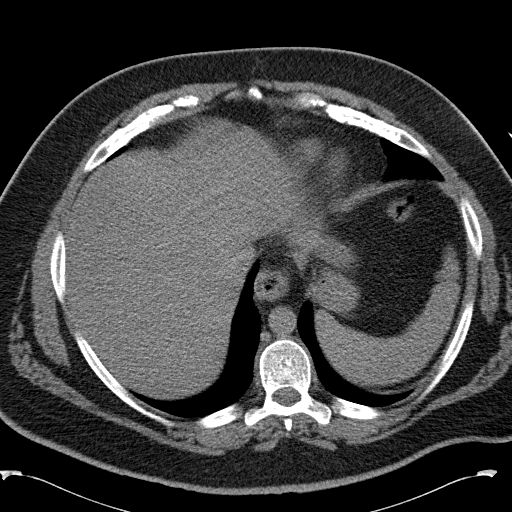
[im 101/106  soft-tissue]
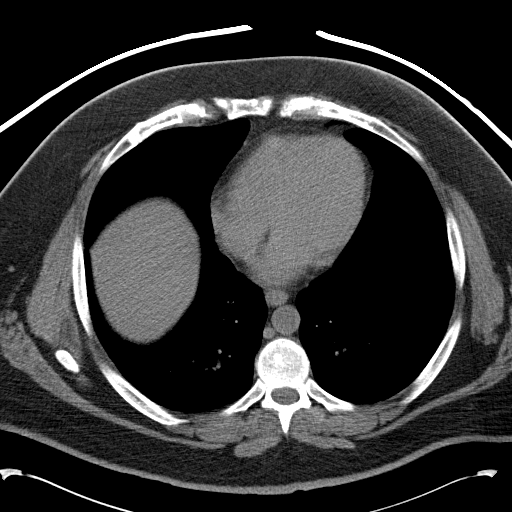

[Series 602: coronals · coronal · 1.04mm/px · 3 of 96 slices shown]
[im 32/96  soft-tissue]
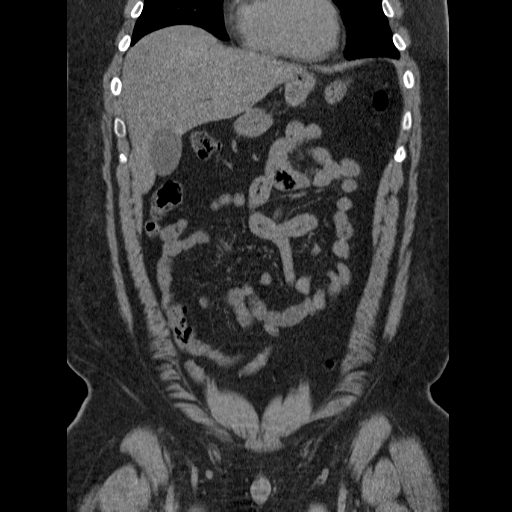
[im 43/96  soft-tissue]
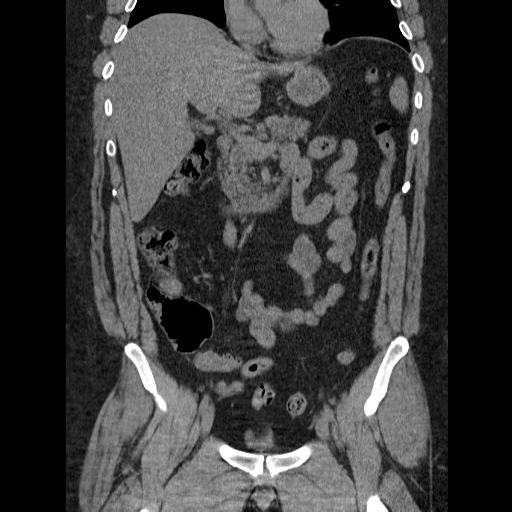
[im 53/96  soft-tissue]
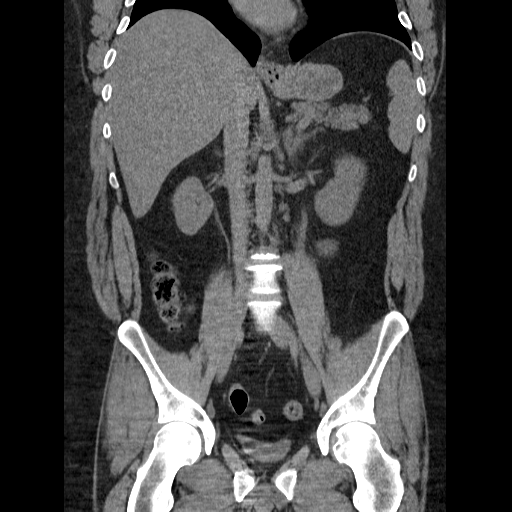

[17 of 46 positions shown; findings below may reference images not displayed]

FINDINGS: Small hiatal hernia.  Unremarkable liver, spleen,
pancreas, gallbladder and adrenal glands.  Small bilateral renal
calculi.  The largest is in the mid right kidney, measuring 6 mm in
maximum diameter.  Mild dilatation of the left renal collecting
system and ureter with mild proximal periureteric soft tissue
stranding.  No ureteral calculi seen within the abdomen.  4 mm
probable partially calcified granuloma in the lingula on image #4.
This was not included on previous examinations.  Mild lumbar and
lower thoracic spine degenerative changes.  Bilateral L5 pars
interarticularis defects without subluxation.
IMPRESSION: 1.  Mild left hydronephrosis and hydroureter.
2.  Small bilateral renal calculi.
3.  Bilateral L5 spondylolysis.

CT PELVIS
FINDINGS: Mild dilatation of the left ureter to the level of a 4
mm calculus in the proximal pelvic portion of the ureter.  This is
not visible on the scout image.  No other ureteral calculi and no
bladder calculi seen.  Unremarkable bowel loops and pelvic bones.
IMPRESSION: 4 mm calculus in the proximal pelvic portion of the left ureter,
causing mild left hydronephrosis.

## 2009-11-21 ENCOUNTER — Emergency Department (HOSPITAL_COMMUNITY): Admission: EM | Admit: 2009-11-21 | Discharge: 2009-11-21 | Payer: Self-pay | Admitting: Emergency Medicine

## 2009-12-22 ENCOUNTER — Emergency Department (HOSPITAL_COMMUNITY): Admission: EM | Admit: 2009-12-22 | Discharge: 2009-12-22 | Payer: Self-pay | Admitting: Emergency Medicine

## 2010-03-18 ENCOUNTER — Emergency Department (HOSPITAL_COMMUNITY): Admission: EM | Admit: 2010-03-18 | Discharge: 2010-03-19 | Payer: Self-pay | Admitting: Emergency Medicine

## 2010-03-18 IMAGING — CT CT ABD-PELV W/O CM
2 of 4 series · 17 of 46 positions shown, 19 images · non-contrast
Comparison: [DATE]

CLINICAL DATA: Right flank pain, nausea, vomiting, question kidney
stone

CT ABDOMEN AND PELVIS WITHOUT CONTRAST
TECHNIQUE: Multidetector CT imaging of the abdomen and pelvis was
performed following the standard protocol without intravenous
contrast. Breast shield utilized.  Sagittal and coronal MPR images
reconstructed from axial data set.

[Series 2: a/p w/o 5.0 b31f st · axial · non-contrast · 0.96mm/px · z∈[+896,+1406]mm · 14 of 112 slices shown, 16 images]
[im 5/112  soft-tissue]
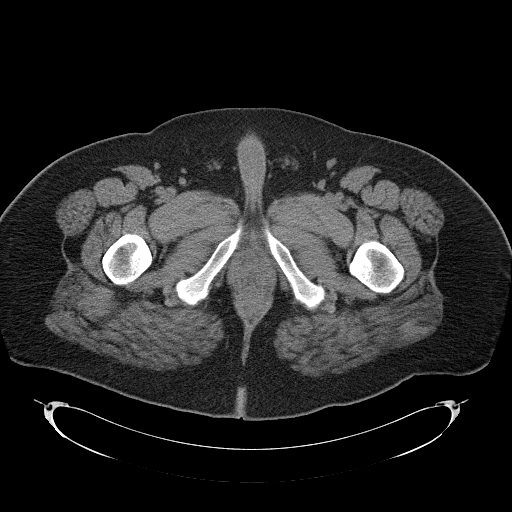
[im 5/112  bone]
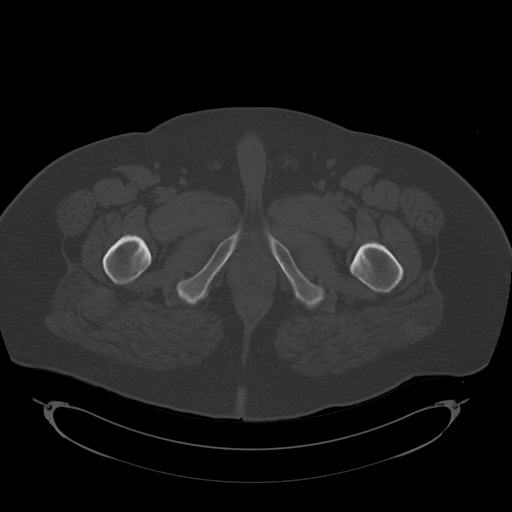
[im 14/112  soft-tissue]
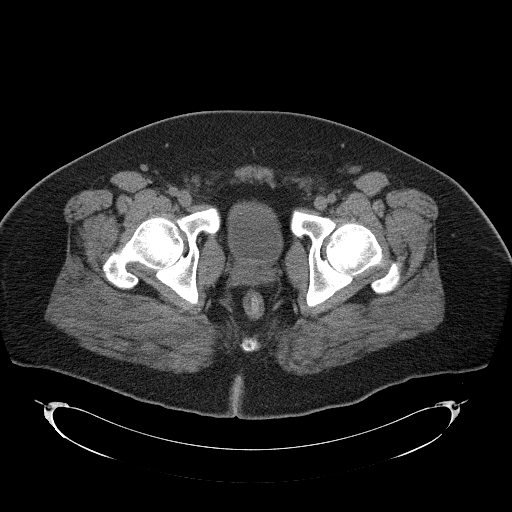
[im 24/112  soft-tissue]
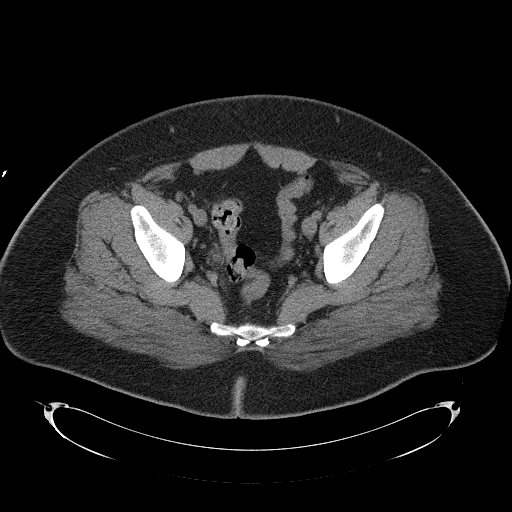
[im 28/112  soft-tissue]
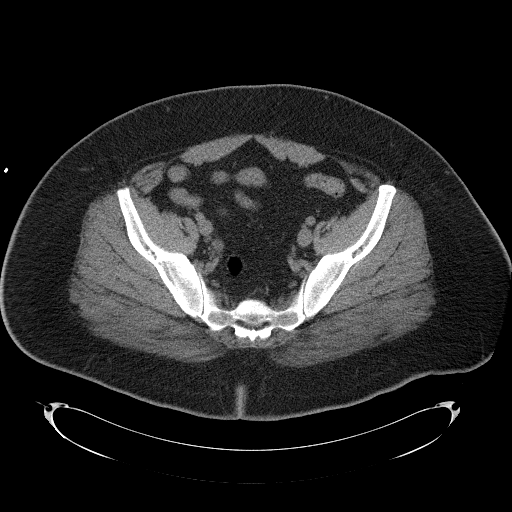
[im 38/112  soft-tissue]
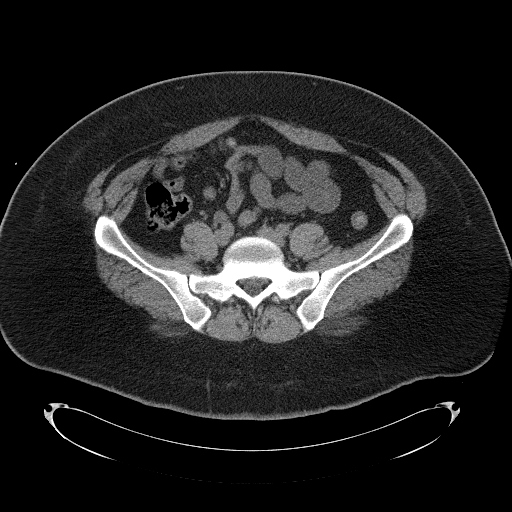
[im 47/112  soft-tissue]
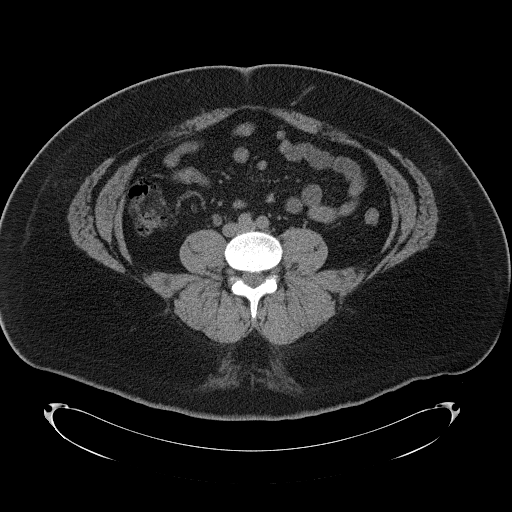
[im 51/112  soft-tissue]
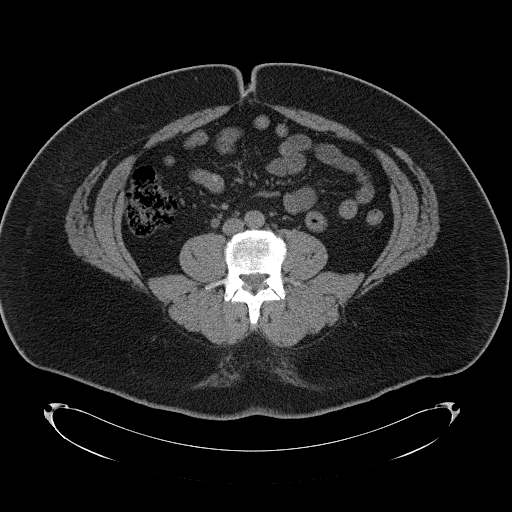
[im 61/112  soft-tissue]
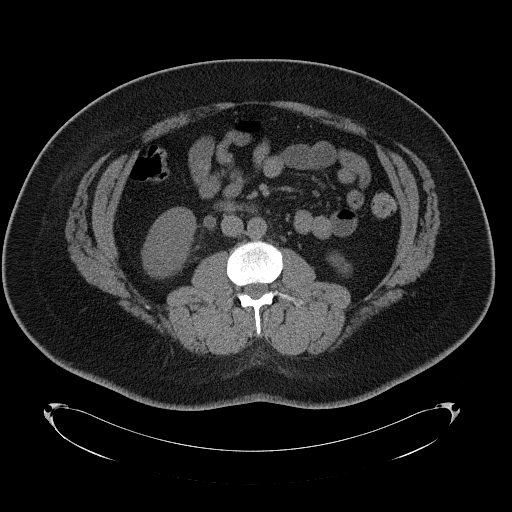
[im 65/112  soft-tissue]
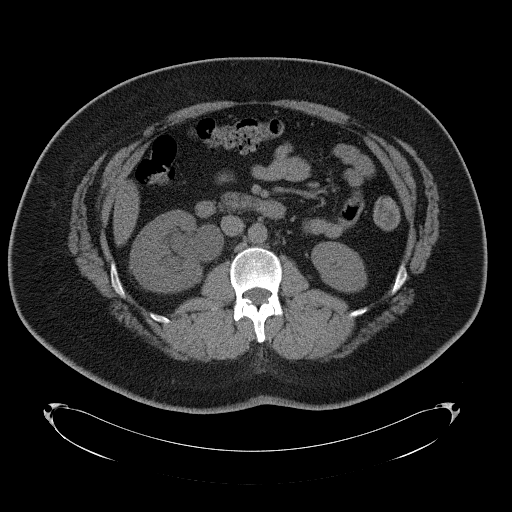
[im 65/112  bone]
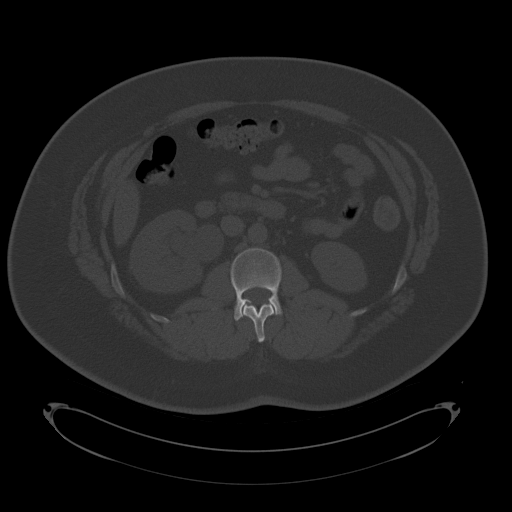
[im 75/112  soft-tissue]
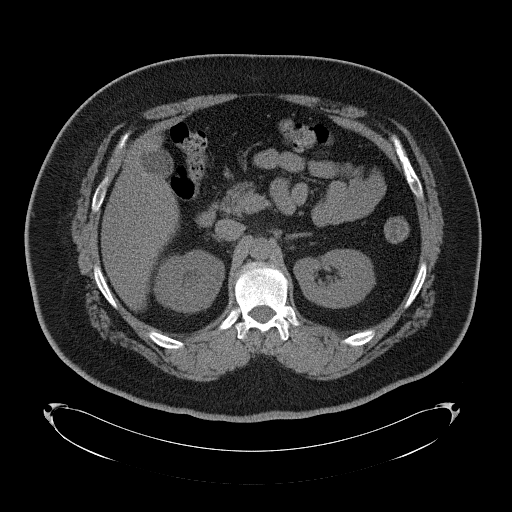
[im 84/112  soft-tissue]
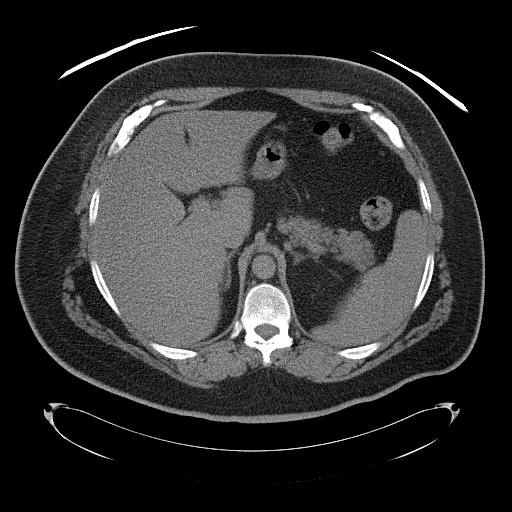
[im 88/112  soft-tissue]
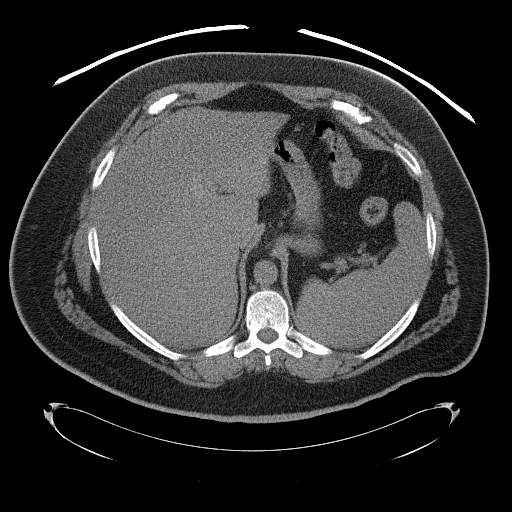
[im 98/112  soft-tissue]
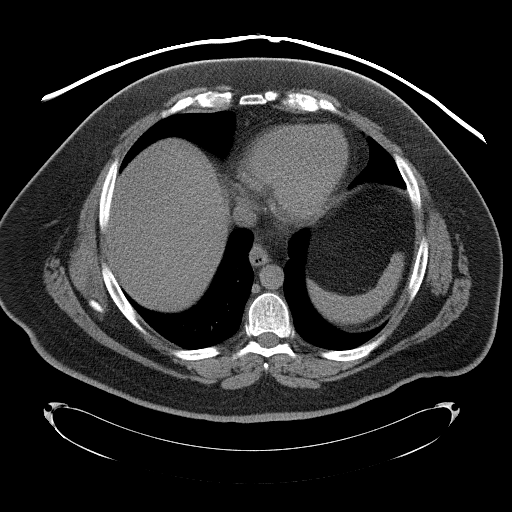
[im 107/112  soft-tissue]
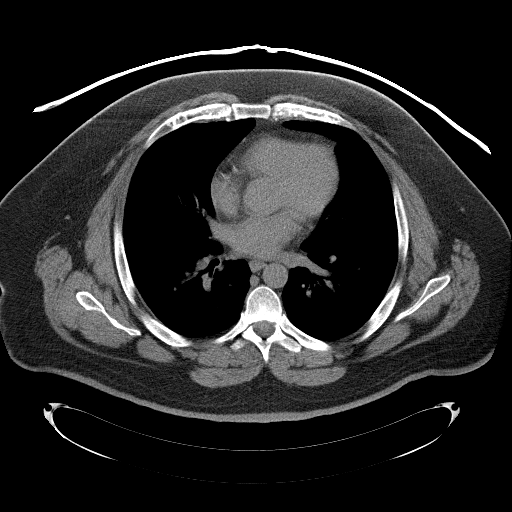

[Series 5: a/p w/o 2.0 spo cor st · coronal · non-contrast · 1.09mm/px · 3 of 184 slices shown]
[im 62/184  soft-tissue]
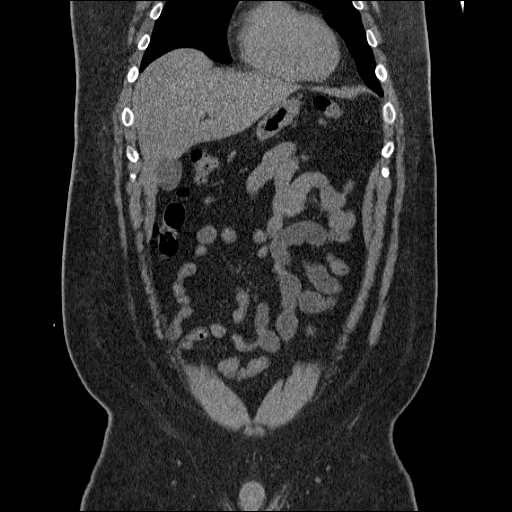
[im 82/184  soft-tissue]
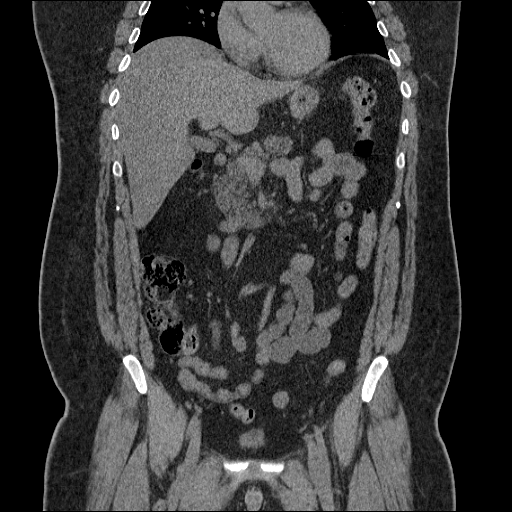
[im 102/184  soft-tissue]
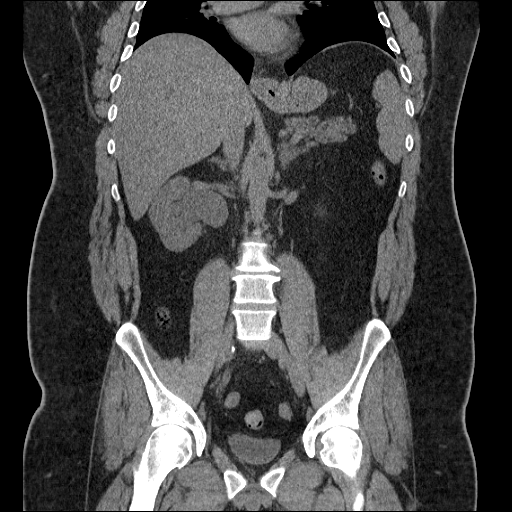

[17 of 46 positions shown; findings below may reference images not displayed]

FINDINGS: Minimal dependent atelectasis at lung bases.
Tiny calcified granulomata left lung base.
Nonobstructing 5 mm diameter calculus upper to mid left kidney
image 41.
Mild right renal enlargement with moderate right hydronephrosis and
hydroureter.
Right ureteral dilatation terminates at a 5 mm diameter calculus in
the mid to lower right pelvis, image 92.
Bladder unremarkable.
Tiny hiatal hernia.
Remaining solid organs and bowel loops unremarkable for exam
lacking IV and oral contrast.
Normal appendix.
No mass, adenopathy, free fluid or inflammatory process.
No acute bony findings.
Minimal scattered iliac artery atherosclerotic calcification.
IMPRESSION: 5 mm diameter nonobstructing left renal calculus.
Right hydronephrosis and hydroureter secondary to a 5 mm diameter
distal right ureteral calculus.

## 2010-09-09 ENCOUNTER — Emergency Department (HOSPITAL_COMMUNITY)
Admission: EM | Admit: 2010-09-09 | Discharge: 2010-09-09 | Payer: Self-pay | Source: Home / Self Care | Admitting: Emergency Medicine

## 2011-02-06 LAB — URINE CULTURE: Colony Count: 2000

## 2011-02-06 LAB — URINALYSIS, ROUTINE W REFLEX MICROSCOPIC
Bilirubin Urine: NEGATIVE
Glucose, UA: NEGATIVE mg/dL
Ketones, ur: 15 mg/dL — AB
Nitrite: NEGATIVE
Protein, ur: 30 mg/dL — AB
Specific Gravity, Urine: 1.026 (ref 1.005–1.030)
Urobilinogen, UA: 0.2 mg/dL (ref 0.0–1.0)
pH: 5.5 (ref 5.0–8.0)

## 2011-02-06 LAB — URINE MICROSCOPIC-ADD ON

## 2011-02-08 LAB — URINALYSIS, ROUTINE W REFLEX MICROSCOPIC
Glucose, UA: NEGATIVE mg/dL
Ketones, ur: 15 mg/dL — AB
Nitrite: POSITIVE — AB
Protein, ur: 30 mg/dL — AB
Specific Gravity, Urine: 1.028 (ref 1.005–1.030)
Urobilinogen, UA: 2 mg/dL — ABNORMAL HIGH (ref 0.0–1.0)
pH: 5 (ref 5.0–8.0)

## 2011-02-08 LAB — URINE MICROSCOPIC-ADD ON

## 2011-02-11 LAB — URINALYSIS, ROUTINE W REFLEX MICROSCOPIC
Bilirubin Urine: NEGATIVE
Glucose, UA: NEGATIVE mg/dL
Ketones, ur: NEGATIVE mg/dL
Leukocytes, UA: NEGATIVE
Nitrite: NEGATIVE
Protein, ur: 30 mg/dL — AB
Specific Gravity, Urine: >1.03 — ABNORMAL HIGH (ref 1.005–1.030)
Urobilinogen, UA: 0.2 mg/dL (ref 0.0–1.0)
pH: 5.5 (ref 5.0–8.0)

## 2011-02-11 LAB — URINE MICROSCOPIC-ADD ON

## 2011-03-02 LAB — URINE MICROSCOPIC-ADD ON

## 2011-03-02 LAB — URINALYSIS, ROUTINE W REFLEX MICROSCOPIC
Glucose, UA: NEGATIVE mg/dL
Ketones, ur: 15 mg/dL — AB
Nitrite: NEGATIVE
Protein, ur: 30 mg/dL — AB
Specific Gravity, Urine: 1.027 (ref 1.005–1.030)
Urobilinogen, UA: 1 mg/dL (ref 0.0–1.0)
pH: 5.5 (ref 5.0–8.0)

## 2011-04-08 NOTE — Op Note (Signed)
   NAME:  Timothy Pratt, Timothy Pratt                     ACCOUNT NO.:  [id number]   MEDICAL RECORD NO.:  [id number]                   PATIENT TYPE:  AMB   LOCATION:  [facility]                                 FACILITY:  [facility]   PHYSICIAN:  Sigmund I. Patsi Sears, M.D.         DATE OF BIRTH:  [birth year]   DATE OF PROCEDURE:  DATE OF DISCHARGE:                                 OPERATIVE REPORT   PREOPERATIVE DIAGNOSIS:  Left lower ureteral stone.   POSTOPERATIVE DIAGNOSIS:  Left lower ureteral stone.   OPERATION:  Cystourethroscopy, left retrograde pyelogram, left ureteroscopy,  left double J catheter.   SURGEON:  Sigmund I. Patsi Sears, M.D.   ANESTHESIA:  General endotracheal.   PREPARATION:  After appropriate preanesthesia, the patient was brought to  the operating room, placed on the operating table in the dorsal supine  position where general endotracheal anesthesia was introduced. He was then  replaced in dorsal lithotomy position where the pubis was prepped with  Betadine solution and draped in the usual fashion.   DESCRIPTION OF PROCEDURE:  Cystourethroscopy revealed a normal appearing  bladder, normal appearing prostate, and normal appearing urethra. The  ureteral orifices were in normal position and there was no evidence of  bladder stone, tumor or diverticular formation. A KUB was accomplished  before the procedure, which showed no evidence of ureteral calculus. Review  of the patient's CT scan showed a probable 2 mm left lower ureteral stone.  Review of the dictated note from radiology also indicates a 2 mm left lower  ureteral calculus. A left retrograde pyelogram was performed, and the ureter  appeared normal. Ureteroscopy was accomplished with the 6.5 French  ureteroscope and no stone could be identified. A guidewire was previously  placed in the renal pelvis prior to retrograde pyelogram. The ureteroscope  was removed by dragging a basket to be sure that there was no stone to be  captured, and following this, it was elected to leave a double J catheter  because of the patient's continued left flank pain. Therefore, a 6 French x  28 cm double J catheter was passed over the guidewire into the renal pelvis  in standard fashion. A KUB was accomplished to ensure that the loop of the  catheter was in excellent position. Following this, the patient underwent  Xylocaine jelly anesthetic in the urethra, and was given IV Toradol prior to  awakening. He was awakened and taken to the recovery room in good condition.                                               Sigmund I. Patsi Sears, M.D.    SIT/MEDQ  D:  09/16/2002  T:  09/16/2002  Job:  [id number]

## 2011-05-23 ENCOUNTER — Emergency Department (HOSPITAL_COMMUNITY)
Admission: EM | Admit: 2011-05-23 | Discharge: 2011-05-23 | Disposition: A | Discharge status: Home / Self Care | Payer: 59 | Attending: Emergency Medicine | Admitting: Emergency Medicine

## 2011-05-23 DIAGNOSIS — S058X9A Other injuries of unspecified eye and orbit, initial encounter: Secondary | ICD-10-CM | POA: Insufficient documentation

## 2011-05-23 DIAGNOSIS — X58XXXA Exposure to other specified factors, initial encounter: Secondary | ICD-10-CM | POA: Insufficient documentation

## 2011-05-23 DIAGNOSIS — H5710 Ocular pain, unspecified eye: Secondary | ICD-10-CM | POA: Insufficient documentation

## 2011-05-23 DIAGNOSIS — I10 Essential (primary) hypertension: Secondary | ICD-10-CM | POA: Insufficient documentation

## 2011-08-10 LAB — BASIC METABOLIC PANEL
BUN: 8
CO2: 26
Chloride: 107
Creatinine, Ser: 0.84
Glucose, Bld: 102 — ABNORMAL HIGH

## 2011-08-10 LAB — DIFFERENTIAL
Basophils Relative: 1
Eosinophils Absolute: 0.2
Eosinophils Relative: 3
Neutrophils Relative %: 65

## 2011-08-10 LAB — CBC
MCHC: 35.4
MCV: 87.4
Platelets: 194

## 2011-08-10 LAB — URINALYSIS, ROUTINE W REFLEX MICROSCOPIC
Bilirubin Urine: NEGATIVE
Nitrite: NEGATIVE
Protein, ur: 30 — AB
Specific Gravity, Urine: 1.026
Urobilinogen, UA: 0.2

## 2011-08-18 LAB — POCT I-STAT, CHEM 8
Glucose, Bld: 105 — ABNORMAL HIGH
HCT: 43
Hemoglobin: 14.6
Potassium: 4
Sodium: 138
TCO2: 25

## 2011-09-05 LAB — URIC ACID: Uric Acid, Serum: 7.9 — ABNORMAL HIGH

## 2012-06-16 ENCOUNTER — Emergency Department (HOSPITAL_COMMUNITY)
Admission: EM | Admit: 2012-06-16 | Discharge: 2012-06-16 | Disposition: A | Discharge status: Home / Self Care | Payer: 59 | Attending: Emergency Medicine | Admitting: Emergency Medicine

## 2012-06-16 ENCOUNTER — Encounter (HOSPITAL_COMMUNITY): Payer: Self-pay | Admitting: Emergency Medicine

## 2012-06-16 DIAGNOSIS — F17200 Nicotine dependence, unspecified, uncomplicated: Secondary | ICD-10-CM | POA: Insufficient documentation

## 2012-06-16 DIAGNOSIS — R109 Unspecified abdominal pain: Secondary | ICD-10-CM | POA: Insufficient documentation

## 2012-06-16 DIAGNOSIS — Z87442 Personal history of urinary calculi: Secondary | ICD-10-CM | POA: Insufficient documentation

## 2012-06-16 HISTORY — DX: Calculus of kidney: N20.0

## 2012-06-16 LAB — BASIC METABOLIC PANEL
BUN: 7 mg/dL (ref 6–23)
CO2: 26 mEq/L (ref 19–32)
Calcium: 9 mg/dL (ref 8.4–10.5)
Chloride: 104 mEq/L (ref 96–112)
Creatinine, Ser: 0.81 mg/dL (ref 0.50–1.35)
GFR calc Af Amer: >90 mL/min (ref >90)
GFR calc non Af Amer: >90 mL/min (ref >90)
Glucose, Bld: 105 mg/dL — ABNORMAL HIGH (ref 70–99)
Potassium: 3.7 mEq/L (ref 3.5–5.1)
Sodium: 139 mEq/L (ref 135–145)

## 2012-06-16 LAB — URINALYSIS, ROUTINE W REFLEX MICROSCOPIC
Glucose, UA: NEGATIVE mg/dL
Ketones, ur: NEGATIVE mg/dL
Leukocytes, UA: NEGATIVE
Nitrite: NEGATIVE
Protein, ur: 100 mg/dL — AB
Specific Gravity, Urine: 1.028 (ref 1.005–1.030)
Urobilinogen, UA: 0.2 mg/dL (ref 0.0–1.0)
pH: 6 (ref 5.0–8.0)

## 2012-06-16 LAB — URINE MICROSCOPIC-ADD ON

## 2012-06-16 MED ORDER — HYDROMORPHONE HCL PF 1 MG/ML IJ SOLN
1.0000 mg | Freq: Once | INTRAMUSCULAR | Status: AC
  Administered 2012-06-16: 1 mg via INTRAVENOUS
  Filled 2012-06-16: qty 1

## 2012-06-16 MED ORDER — KETOROLAC TROMETHAMINE 15 MG/ML IJ SOLN
15.0000 mg | Freq: Once | INTRAMUSCULAR | Status: AC
  Administered 2012-06-16: 15 mg via INTRAVENOUS
  Filled 2012-06-16: qty 1

## 2012-06-16 MED ORDER — ONDANSETRON HCL 4 MG/2ML IJ SOLN
4.0000 mg | Freq: Once | INTRAMUSCULAR | Status: AC
  Administered 2012-06-16: 4 mg via INTRAVENOUS
  Filled 2012-06-16: qty 2

## 2012-06-16 MED ORDER — LISINOPRIL 20 MG PO TABS: 40.0000 mg | ORAL_TABLET | Freq: Every day | ORAL | Status: DC

## 2012-06-16 MED ORDER — SODIUM CHLORIDE 0.9 % IV BOLUS (SEPSIS)
1000.0000 mL | Freq: Once | INTRAVENOUS | Status: AC
  Administered 2012-06-16: 1000 mL via INTRAVENOUS

## 2012-06-16 MED ORDER — PROMETHAZINE HCL 25 MG PO TABS: 25.0000 mg | ORAL_TABLET | Freq: Three times a day (TID) | ORAL | Status: DC | PRN

## 2012-06-16 MED ORDER — HYDROCODONE-ACETAMINOPHEN 5-500 MG PO TABS: 1.0000 | ORAL_TABLET | Freq: Four times a day (QID) | ORAL | Status: AC | PRN

## 2012-06-16 MED ORDER — TAMSULOSIN HCL 0.4 MG PO CAPS: 0.4000 mg | ORAL_CAPSULE | Freq: Every day | ORAL | Status: DC

## 2012-06-16 NOTE — ED Notes (Signed)
Woke this a.m. W/ right flank pain, nausea and emesis. Endorses hx of kidney stones. Urine grossly bloody.

## 2012-06-16 NOTE — ED Notes (Signed)
RN to obtain labs with start of IV 

## 2012-06-16 NOTE — ED Notes (Signed)
Gave urine sample

## 2012-06-22 NOTE — ED Provider Notes (Signed)
History    36 year old male with right flank pain. Onset earlier this morning. He woke up with it. Pain is sharp. Radiates to his groin. Patient with history kidney stones years ago and says that this feels similar. With initial onset of pain was sick to stomach and did vomit. Nonbilious nonbloody. No fevers or chills. Has noted blood in his urine. No diarrhea. No sick contacts. CSN: [id number]  Arrival date & time 06/16/12  1478   First MD Initiated Contact with Patient 06/16/12 [phone]      Chief Complaint  Patient presents with  . Flank Pain  . Nausea    (Consider location/radiation/quality/duration/timing/severity/associated sxs/prior treatment) HPI  Past Medical History  Diagnosis Date  . Kidney calculi     History reviewed. No pertinent past surgical history.  No family history on file.  History  Substance Use Topics  . Smoking status: Current Everyday Smoker -- 1.0 packs/day  . Smokeless tobacco: Never Used  . Alcohol Use: No      Review of Systems   Review of symptoms negative unless otherwise noted in HPI.   Allergies  Review of patient's allergies indicates no known allergies.  Home Medications   Current Outpatient Rx  Name Route Sig Dispense Refill  . LISINOPRIL 20 MG PO TABS Oral Take 20 mg by mouth daily.    Marland Kitchen HYDROCODONE-ACETAMINOPHEN 5-500 MG PO TABS Oral Take 1-2 tablets by mouth every 6 (six) hours as needed for pain. 20 tablet 0  . LISINOPRIL 20 MG PO TABS Oral Take 2 tablets (40 mg total) by mouth daily. 60 tablet 0  . PROMETHAZINE HCL 25 MG PO TABS Oral Take 1 tablet (25 mg total) by mouth every 8 (eight) hours as needed for nausea. 15 tablet 0  . TAMSULOSIN HCL 0.4 MG PO CAPS Oral Take 1 capsule (0.4 mg total) by mouth daily. 5 capsule 0    BP 146/93  Pulse 75  Temp 97.7 F (36.5 C) (Oral)  Resp 18  SpO2 98%  Physical Exam  Nursing note and vitals reviewed. Constitutional: He appears well-developed and well-nourished. No distress.    HENT:  Head: Normocephalic and atraumatic.  Eyes: Conjunctivae are normal. Right eye exhibits no discharge. Left eye exhibits no discharge.  Neck: Neck supple.  Cardiovascular: Normal rate, regular rhythm and normal heart sounds.  Exam reveals no gallop and no friction rub.   No murmur heard. Pulmonary/Chest: Effort normal and breath sounds normal. No respiratory distress.  Abdominal: Soft. He exhibits no distension. There is no tenderness.       No abdominal tenderness. No Distention.  Musculoskeletal: He exhibits no edema and no tenderness.       Right CVA tenderness  Neurological: He is alert.  Skin: Skin is warm and dry.  Psychiatric: He has a normal mood and affect. His behavior is normal. Thought content normal.    ED Course  Procedures (including critical care time)  Labs Reviewed  URINALYSIS, ROUTINE W REFLEX MICROSCOPIC - Abnormal; Notable for the following:    Color, Urine AMBER (*)  BIOCHEMICALS MAY BE AFFECTED BY COLOR   APPearance CLOUDY (*)     Hgb urine dipstick LARGE (*)     Bilirubin Urine SMALL (*)     Protein, ur 100 (*)     All other components within normal limits  BASIC METABOLIC PANEL - Abnormal; Notable for the following:    Glucose, Bld 105 (*)     All other components within normal limits  URINE  MICROSCOPIC-ADD ON - Abnormal; Notable for the following:    Bacteria, UA MANY (*)     All other components within normal limits  LAB REPORT - SCANNED   No results found.   1. Flank pain       MDM  36 year old male with flank pain. History kidney stones and current symptoms consistent with her ureteral colic. Discussed imaging versus presumptive treatment. Patient elected for neuroimaging at this time as I think is very reasonable. Renal function is normal. Patient's pain is much improved prior to discharge. He is tolerating oral intake. Will discharge with pain medicine and antiemetics as needed. Patient is also requesting a prescription for lisinopril  since is currently out. Urology followup as needed.        Raeford Razor, MD 06/22/12 1213

## 2012-11-02 ENCOUNTER — Emergency Department (HOSPITAL_COMMUNITY)
Admission: EM | Admit: 2012-11-02 | Discharge: 2012-11-02 | Disposition: A | Discharge status: Home / Self Care | Payer: 59 | Attending: Emergency Medicine | Admitting: Emergency Medicine

## 2012-11-02 ENCOUNTER — Encounter (HOSPITAL_COMMUNITY): Payer: Self-pay | Admitting: Emergency Medicine

## 2012-11-02 ENCOUNTER — Emergency Department (HOSPITAL_COMMUNITY): Payer: 59

## 2012-11-02 DIAGNOSIS — Y929 Unspecified place or not applicable: Secondary | ICD-10-CM | POA: Insufficient documentation

## 2012-11-02 DIAGNOSIS — F17200 Nicotine dependence, unspecified, uncomplicated: Secondary | ICD-10-CM | POA: Insufficient documentation

## 2012-11-02 DIAGNOSIS — Z79899 Other long term (current) drug therapy: Secondary | ICD-10-CM | POA: Insufficient documentation

## 2012-11-02 DIAGNOSIS — I10 Essential (primary) hypertension: Secondary | ICD-10-CM | POA: Insufficient documentation

## 2012-11-02 DIAGNOSIS — S61209A Unspecified open wound of unspecified finger without damage to nail, initial encounter: Secondary | ICD-10-CM | POA: Insufficient documentation

## 2012-11-02 DIAGNOSIS — T148XXA Other injury of unspecified body region, initial encounter: Secondary | ICD-10-CM

## 2012-11-02 DIAGNOSIS — Z87442 Personal history of urinary calculi: Secondary | ICD-10-CM | POA: Insufficient documentation

## 2012-11-02 DIAGNOSIS — Y939 Activity, unspecified: Secondary | ICD-10-CM | POA: Insufficient documentation

## 2012-11-02 DIAGNOSIS — W278XXA Contact with other nonpowered hand tool, initial encounter: Secondary | ICD-10-CM | POA: Insufficient documentation

## 2012-11-02 HISTORY — DX: Essential (primary) hypertension: I10

## 2012-11-02 IMAGING — CR DG FINGER RING 2+V*R*
3 series · 3 of 3 positions shown · non-contrast
Comparison: None.

CLINICAL DATA: Laceration to the distal phalanx of the right third
finger.

RIGHT RING FINGER 2+V

[x finger pa right]
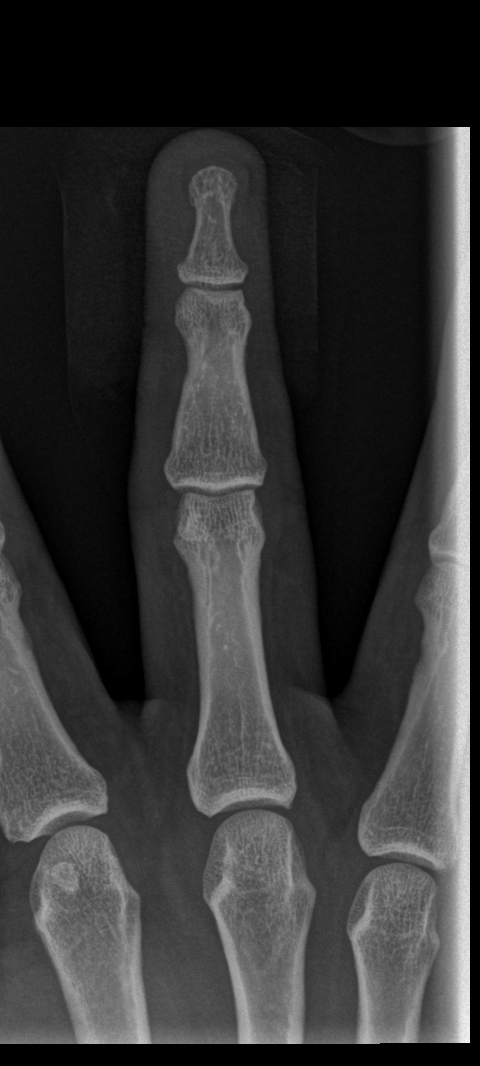

[x finger obl right]
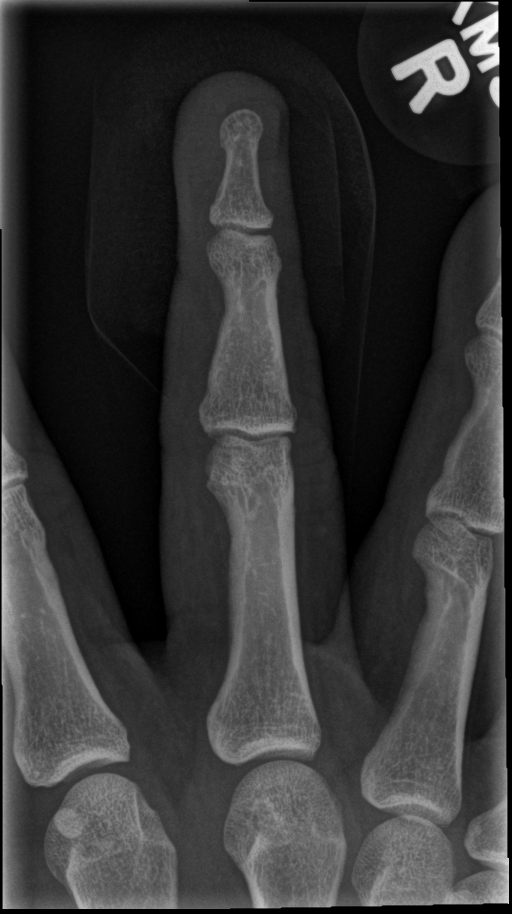

[x finger lat right]
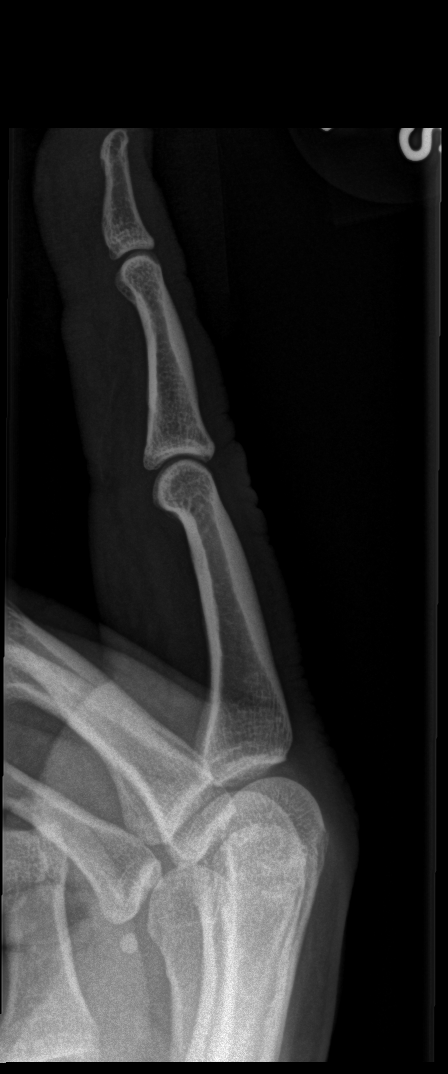

[3 of 3 positions shown; findings below may reference images not displayed]

FINDINGS: Mild soft tissue swelling of the distal aspect of the
right third finger.  No radiopaque soft tissue foreign bodies.
Underlying bones appear intact.  No evidence of acute fracture or
subluxation.  No focal bone lesion or bone destruction.  Bone
cortex and trabecular architecture appear intact.
IMPRESSION: No acute bony abnormalities.  No radiopaque soft tissue foreign
bodies.

## 2012-11-02 MED ORDER — CEPHALEXIN 500 MG PO CAPS: 500.0000 mg | ORAL_CAPSULE | Freq: Two times a day (BID) | ORAL | Status: DC

## 2012-11-02 NOTE — ED Notes (Signed)
Pt drilled his finger with a hand drill yesterday.  Pt. Has a 2 cm puncture wound on the right ring finger.  Pt states he has limited pain.  1 out of 10 pain on a 0 out of 10 scale

## 2012-11-02 NOTE — ED Provider Notes (Signed)
History     CSN: [id number]  Arrival date & time 11/02/12  2141   First MD Initiated Contact with Patient 11/02/12 2212      Chief Complaint  Patient presents with  . Finger Injury    (Consider location/radiation/quality/duration/timing/severity/associated sxs/prior treatment) HPI  FESTUS PURSEL is a 36 y.o. male complaining of laceration to right fourth digit distal phalanx on the volar side. Patient states he went drills into it yesterday. Tetanus is up-to-date, full range of motion, bleeding is controlled. Patient is left-hand dominant  Past Medical History  Diagnosis Date  . Kidney calculi   . Hypertension     History reviewed. No pertinent past surgical history.  No family history on file.  History  Substance Use Topics  . Smoking status: Current Every Day Smoker -- 1.0 packs/day  . Smokeless tobacco: Never Used  . Alcohol Use: No      Review of Systems  Constitutional: Negative for fever.  Respiratory: Negative for shortness of breath.   Cardiovascular: Negative for chest pain.  Gastrointestinal: Negative for nausea, vomiting, abdominal pain and diarrhea.  Skin: Positive for wound.  All other systems reviewed and are negative.    Allergies  Review of patient's allergies indicates no known allergies.  Home Medications   Current Outpatient Rx  Name  Route  Sig  Dispense  Refill  . IBUPROFEN 800 MG PO TABS   Oral   Take 800 mg by mouth every 8 (eight) hours as needed. Pain         . LISINOPRIL 20 MG PO TABS   Oral   Take 40 mg by mouth daily.           BP 169/109  Pulse 92  Temp 98 F (36.7 C)  Resp 16  Wt 275 lb (124.739 kg)  SpO2 99%  Physical Exam  Nursing note and vitals reviewed. Constitutional: He is oriented to person, place, and time. He appears well-developed and well-nourished. No distress.  HENT:  Head: Normocephalic.  Eyes: Conjunctivae normal and EOM are normal.  Cardiovascular: Normal rate.   Pulmonary/Chest:  Effort normal. No stridor.  Abdominal: Soft.  Musculoskeletal: Normal range of motion.  Neurological: He is alert and oriented to person, place, and time.  Skin:       7 mm  puncture wound to the pulp of the right fourth digit distal phalanx. Bleeding controlled, no signs of infection.   Psychiatric: He has a normal mood and affect.    ED Course  Procedures (including critical care time)  Labs Reviewed - No data to display No results found.   1. Puncture wound       MDM  X-ray shows no bony abnormality. Wound is not gaping there is no need to close at this time. Instructed patient on wound care and I will give him a prophylactic dose of Keflex. Hand surgery referral given. Return precautions given.  New Prescriptions   CEPHALEXIN (KEFLEX) 500 MG CAPSULE    Take 1 capsule (500 mg total) by mouth 2 (two) times daily.          Wynetta Emery, PA-C 11/02/12 2311

## 2012-11-02 NOTE — ED Notes (Signed)
Pt alert, arrives with family member being seen, pt c/o right 4th finger injury, states 'i drilled it yesterday, DSD in place, resp even unlabored, skin pwd

## 2012-11-04 NOTE — ED Provider Notes (Signed)
Medical screening examination/treatment/procedure(s) were performed by non-physician practitioner and as supervising physician I was immediately available for consultation/collaboration.  Lawana Hartzell T Ciarrah Rae, MD 11/04/12 1521 

## 2013-03-30 ENCOUNTER — Emergency Department (INDEPENDENT_AMBULATORY_CARE_PROVIDER_SITE_OTHER)
Admission: EM | Admit: 2013-03-30 | Discharge: 2013-03-30 | Disposition: A | Discharge status: Home / Self Care | Payer: 59 | Source: Home / Self Care | Attending: Emergency Medicine | Admitting: Emergency Medicine

## 2013-03-30 ENCOUNTER — Encounter (HOSPITAL_COMMUNITY): Payer: Self-pay | Admitting: Emergency Medicine

## 2013-03-30 DIAGNOSIS — K0401 Reversible pulpitis: Secondary | ICD-10-CM

## 2013-03-30 MED ORDER — OXYCODONE-ACETAMINOPHEN 5-325 MG PO TABS: ORAL_TABLET | ORAL | Status: DC

## 2013-03-30 MED ORDER — IBUPROFEN 800 MG PO TABS
800.0000 mg | ORAL_TABLET | Freq: Once | ORAL | Status: AC
  Administered 2013-03-30: 800 mg via ORAL

## 2013-03-30 MED ORDER — NAPROXEN 500 MG PO TABS: 500.0000 mg | ORAL_TABLET | Freq: Two times a day (BID) | ORAL | Status: DC

## 2013-03-30 MED ORDER — IBUPROFEN 800 MG PO TABS
ORAL_TABLET | ORAL | Status: AC
  Filled 2013-03-30: qty 1

## 2013-03-30 MED ORDER — HYDROCODONE-ACETAMINOPHEN 5-325 MG PO TABS
ORAL_TABLET | ORAL | Status: AC
  Filled 2013-03-30: qty 2

## 2013-03-30 MED ORDER — HYDROCODONE-ACETAMINOPHEN 5-325 MG PO TABS
2.0000 | ORAL_TABLET | Freq: Once | ORAL | Status: AC
  Administered 2013-03-30: 2 via ORAL

## 2013-03-30 MED ORDER — CLINDAMYCIN HCL 300 MG PO CAPS: 300.0000 mg | ORAL_CAPSULE | Freq: Four times a day (QID) | ORAL | Status: DC

## 2013-03-30 NOTE — ED Provider Notes (Addendum)
Chief Complaint:   Chief Complaint  Patient presents with  . Dental Pain    History of Present Illness:   Timothy Pratt is a 37 year old male who's had a two-day history of pain in the right, lower, second premolar and first molar. These teeth have broken off in the past and he intended to get something done, but never got around to it. They started to hurt yesterday. He's had swelling of the gingiva, the jaw, the cheek. He is able open his mouth is no pain with opening or closing his mouth. It hurts to chew on that side. He has no difficulty swallowing or breathing. He denies any headache, fever, chills, or swelling of his neck. He feels slightly nauseated. No chest pain or shortness of breath.  Review of Systems:  Other than noted above, the patient denies any of the following symptoms: Systemic:  No fever, chills,  Or sweats. ENT:  No headache, ear ache, sore throat, nasal congestion, facial pain, or swelling. Lymphatic:  No adenopathy. Lungs:  No coughing, wheezing or shortness of breath.  PMFSH:  Past medical history, family history, social history, meds, and allergies were reviewed. He has hypertension but is not taking any medication for it now.  Physical Exam:   Vital signs:  BP 154/89  Pulse 90  Temp(Src) 99 F (37.2 C) (Oral)  Resp 16  SpO2 97% General:  Alert, oriented, in no distress. ENT:  TMs and canals normal.  Nasal mucosa normal. Mouth exam:  He has widespread dental decay and many teeth that are decayed down to the gumline. The right, lower, second premolar and first molar are markedly decayed. There is surrounding gingival swelling and inflammation. No purulent drainage. No swelling of the floor the mouth. The pharynx is clear and widely patent. Neck:  No swelling or adenopathy. Lungs:  Breath sounds clear and equal bilaterally.  No wheezes, rales or rhonchi. Heart:  Regular rhythm.  No gallops or murmers. Skin:  Clear, warm and dry.   Course in Urgent Care  Center:   Given ibuprofen 800 mg and 2 Norco 5/325 for pain.  Assessment:  The encounter diagnosis was Pulpitis.  Plan:   1.  The following meds were prescribed:   Discharge Medication List as of 03/30/2013  5:15 PM    START taking these medications   Details  clindamycin (CLEOCIN) 300 MG capsule Take 1 capsule (300 mg total) by mouth 4 (four) times daily., Starting 03/30/2013, Until Discontinued, Normal    naproxen (NAPROSYN) 500 MG tablet Take 1 tablet (500 mg total) by mouth 2 (two) times daily., Starting 03/30/2013, Until Discontinued, Normal    oxyCODONE-acetaminophen (PERCOCET) 5-325 MG per tablet 1 to 2 tablets every 6 hours as needed for pain., Print       2.  The patient was instructed in symptomatic care and handouts were given.  Suggested sleeping with head of bed elevated and hot salt water mouthwash. 3.  The patient was told to return if becoming worse in any way, if no better in 3 or 4 days, and given some red flag symptoms such as difficulty breathing or high fever that would indicate earlier return, especially difficulty breathing. 4.  The patient was told to follow up with a dentist as soon as possible.    Reuben Likes, MD 03/30/13 1610  Reuben Likes, MD 03/30/13 [phone]

## 2013-03-30 NOTE — ED Notes (Signed)
Pt c/o dental pain onset yest Sx include: swelling, nauseas -- gradually getting worse Denies: f/v/d He is alert and oriented w/no signs of acute distress.

## 2013-12-13 ENCOUNTER — Encounter: Payer: Self-pay | Admitting: Family Medicine

## 2013-12-13 ENCOUNTER — Encounter (INDEPENDENT_AMBULATORY_CARE_PROVIDER_SITE_OTHER): Payer: Self-pay

## 2013-12-13 ENCOUNTER — Ambulatory Visit (INDEPENDENT_AMBULATORY_CARE_PROVIDER_SITE_OTHER): Payer: 59 | Admitting: Family Medicine

## 2013-12-13 VITALS — BP 144/100 | Temp 98.8°F | Ht 69.75 in | Wt 315.0 lb

## 2013-12-13 DIAGNOSIS — Z23 Encounter for immunization: Secondary | ICD-10-CM

## 2013-12-13 DIAGNOSIS — Z7189 Other specified counseling: Secondary | ICD-10-CM

## 2013-12-13 DIAGNOSIS — I10 Essential (primary) hypertension: Secondary | ICD-10-CM

## 2013-12-13 DIAGNOSIS — Z0000 Encounter for general adult medical examination without abnormal findings: Secondary | ICD-10-CM

## 2013-12-13 DIAGNOSIS — R310 Gross hematuria: Secondary | ICD-10-CM | POA: Insufficient documentation

## 2013-12-13 DIAGNOSIS — Z7689 Persons encountering health services in other specified circumstances: Secondary | ICD-10-CM

## 2013-12-13 DIAGNOSIS — R21 Rash and other nonspecific skin eruption: Secondary | ICD-10-CM | POA: Insufficient documentation

## 2013-12-13 LAB — LIPID PANEL
CHOL/HDL RATIO: 4
Cholesterol: 179 mg/dL (ref 0–200)
HDL: 40.7 mg/dL (ref >39.00)
LDL CALC: 120 mg/dL — AB (ref 0–99)
TRIGLYCERIDES: 90 mg/dL (ref 0.0–149.0)
VLDL: 18 mg/dL (ref 0.0–40.0)

## 2013-12-13 LAB — BASIC METABOLIC PANEL
BUN: 9 mg/dL (ref 6–23)
CALCIUM: 8.6 mg/dL (ref 8.4–10.5)
CO2: 30 meq/L (ref 19–32)
CREATININE: 0.9 mg/dL (ref 0.4–1.5)
Chloride: 105 mEq/L (ref 96–112)
GFR: 101.69 mL/min (ref >60.00)
GLUCOSE: 96 mg/dL (ref 70–99)
Potassium: 4.5 mEq/L (ref 3.5–5.1)
Sodium: 140 mEq/L (ref 135–145)

## 2013-12-13 LAB — URINALYSIS
BILIRUBIN URINE: NEGATIVE
Hgb urine dipstick: NEGATIVE
KETONES UR: NEGATIVE
LEUKOCYTES UA: NEGATIVE
Nitrite: NEGATIVE
Specific Gravity, Urine: >=1.03 — AB (ref 1.000–1.030)
Total Protein, Urine: NEGATIVE
UROBILINOGEN UA: 0.2 (ref 0.0–1.0)
Urine Glucose: NEGATIVE
pH: 5.5 (ref 5.0–8.0)

## 2013-12-13 LAB — HEMOGLOBIN A1C: Hgb A1c MFr Bld: 5.3 % (ref 4.6–6.5)

## 2013-12-13 MED ORDER — LISINOPRIL 10 MG PO TABS: 10.0000 mg | ORAL_TABLET | Freq: Every day | ORAL | Status: DC

## 2013-12-13 MED ORDER — TRIAMCINOLONE ACETONIDE 0.1 % EX CREA: 1.0000 "application " | TOPICAL_CREAM | Freq: Two times a day (BID) | CUTANEOUS | Status: DC

## 2013-12-13 MED ORDER — KETOCONAZOLE 2 % EX SHAM: 1.0000 "application " | MEDICATED_SHAMPOO | CUTANEOUS | Status: DC

## 2013-12-13 NOTE — Progress Notes (Signed)
Pre visit review using our clinic review tool, if applicable. No additional management support is needed unless otherwise documented below in the visit note. 

## 2013-12-13 NOTE — Progress Notes (Signed)
Chief Complaint  Patient presents with  . Establish Care    HPI:  Timothy Pratt is here to establish care. Recently getting back into care. Last PCP and physical:  Has the following chronic problems and concerns today:  Skin Rash: -saw dermatologist in the past and told to use selsun blue -itchy at times -persists and spread from one patch on chest and now multiple spots on trunk and upper legs  HTN: -used to take lisinopril -ran out several months ago -quit smoking 3 months ago  Gross Hematuria: -hx of 3 episodes of gross hematuria in the last several months -hx of passing numerous stones and often only presents with hematuria -usually notices the hematuria after heavy lifting -used to see urologist but has been several years -denies: fevers, pain in GU region, NVD, weight loss, penile discharge or pain. -hx of smoking  Patient Active Problem List   Diagnosis Date Noted  . Essential hypertension, benign 12/13/2013  . Gross hematuria 12/13/2013  . Rash and nonspecific skin eruption 12/13/2013   Health Maintenance: - flu vaccine wants this - tdap wants this  ROS: See pertinent positives and negatives per HPI.  Past Medical History  Diagnosis Date  . Kidney calculi   . Hypertension     Family History  Problem Relation Age of Onset  . Hypertension Mother   . Hypertension Father   . Heart disease Father   . COPD Father   . Hypertension Sister   . Kidney disease Brother   . Kidney disease Daughter     History   Social History  . Marital Status: Married    Spouse Name: N/A    Number of Children: N/A  . Years of Education: N/A   Social History Main Topics  . Smoking status: Former Smoker -- 1.00 packs/day  . Smokeless tobacco: Never Used     Comment: smoked for 11 years, pack per day  . Alcohol Use: Yes     Comment: rare  . Drug Use: None  . Sexual Activity: None   Other Topics Concern  . 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 Pain, Disp: , Rfl: ;  ketoconazole (NIZORAL) 2 % shampoo, Apply 1 application topically 2 (two) times a week., Disp: 120 mL, Rfl: 0;  lisinopril (PRINIVIL,ZESTRIL) 10 MG tablet, Take 1 tablet (10 mg total) by mouth daily., Disp: 90 tablet, Rfl: 3 triamcinolone cream (KENALOG) 0.1 %, Apply 1 application topically 2 (two) times daily., Disp: 30 g, Rfl: 0  EXAM:  Filed Vitals:   12/13/13 0806  BP: 144/100  Temp: 98.8 F (37.1 C)    Body mass index is 45.5 kg/(m^2).  GENERAL: vitals reviewed and listed above, alert, oriented, appears well hydrated and in no acute distress  HEENT: atraumatic, conjunttiva clear, no obvious abnormalities on inspection of external nose and ears  NECK: no obvious masses on inspection  LUNGS: clear to auscultation bilaterally, no wheezes, rales or rhonchi, good air movement  CV: HRRR, no peripheral edema  MS: moves all extremities without noticeable abnormality  PSYCH: pleasant and cooperative, no  obvious depression or anxiety  ASSESSMENT AND PLAN:  Discussed the following assessment and plan:  Encounter to establish care - Plan: Hemoglobin A1c, Lipid Panel  Essential hypertension, benign - Plan: Basic metabolic panel, lisinopril (PRINIVIL,ZESTRIL) 10 MG tablet  Gross hematuria - Plan: Urinalysis, Culture, Urine  Rash and nonspecific skin eruption - Plan: ketoconazole (NIZORAL) 2 % shampoo, triamcinolone cream (KENALOG) 0.1 %  Encounter for preventive health examination  -We reviewed the PMH, PSH, FH, SH, Meds and Allergies. -We provided refills for any medications we will prescribe as needed. -We addressed current concerns per orders and  patient instructions. -We have asked for records for pertinent exams, studies, vaccines and notes from previous providers. -We have advised patient to follow up per instructions below. -tdap and flu -FASTING LABS -he is going to schedule appt with urologist - will check for infection in the meantime -level a and b uspstf recs advised -Patient advised to return or notify a doctor immediately if symptoms worsen or persist or new concerns arise.  Patient Instructions  -We have ordered labs or studies at this visit. It can take up to 1-2 weeks for results and processing. We will contact you with instructions IF your results are abnormal. Normal results will be released to your [facility]. If you have not heard from [place] or can not find your results in [facility] in 2 weeks please contact our office.  -PLEASE SIGN UP FOR MYCHART TODAY   We recommend the following healthy lifestyle measures: - eat a healthy diet consisting of lots of vegetables, fruits, beans, nuts, seeds, healthy meats such as white chicken and fish and whole grains.  - avoid fried foods, fast food, processed foods, sodas, red meet and other fattening foods.  - get a least 150 minutes of aerobic exercise per week.   For blood pressure restart lisinopril 10mg  daily  For Rash use shampoo 2 x per week (leave on 5 minutes then rinse off) and use cream twice daily  For blood in urine please contact your urologist  Follow up in: 1 month for blood pressure and rash      Odarius Dines R.

## 2013-12-13 NOTE — Patient Instructions (Signed)
-  We have ordered labs or studies at this visit. It can take up to 1-2 weeks for results and processing. We will contact you with instructions IF your results are abnormal. Normal results will be [facility] to your [facility]. If you have not heard from us or can not find your results in [facility] in 2 weeks please contact our office.  -PLEASE SIGN UP FOR MYCHART TODAY   We recommend the following healthy lifestyle measures: - eat a healthy diet consisting of lots of vegetables, fruits, beans, nuts, seeds, healthy meats such as white chicken and fish and whole grains.  - avoid fried foods, fast food, processed foods, sodas, red meet and other fattening foods.  - get a least 150 minutes of aerobic exercise per week.   For blood pressure restart lisinopril 10mg  daily  For Rash use shampoo 2 x per week (leave on 5 minutes then rinse off) and use cream twice daily  For blood in urine please contact your urologist  Follow up in: 1 month for blood pressure and rash

## 2013-12-13 NOTE — Addendum Note (Signed)
Addended by: Azucena FreedMILLNER, Kalonji Zurawski C on: 12/13/2013 09:02 AM   Modules accepted: Orders

## 2013-12-15 LAB — URINE CULTURE
Colony Count: NO GROWTH
Organism ID, Bacteria: NO GROWTH

## 2013-12-16 ENCOUNTER — Telehealth: Payer: Self-pay | Admitting: Family Medicine

## 2013-12-16 NOTE — Telephone Encounter (Signed)
Relevant patient education assigned to patient using Emmi. ° °

## 2014-01-09 ENCOUNTER — Encounter: Payer: Self-pay | Admitting: Family Medicine

## 2014-01-09 ENCOUNTER — Ambulatory Visit (INDEPENDENT_AMBULATORY_CARE_PROVIDER_SITE_OTHER): Payer: 59 | Admitting: Family Medicine

## 2014-01-09 VITALS — BP 130/84 | Temp 98.5°F | Wt 317.0 lb

## 2014-01-09 DIAGNOSIS — M545 Low back pain, unspecified: Secondary | ICD-10-CM

## 2014-01-09 DIAGNOSIS — R310 Gross hematuria: Secondary | ICD-10-CM

## 2014-01-09 DIAGNOSIS — I10 Essential (primary) hypertension: Secondary | ICD-10-CM

## 2014-01-09 DIAGNOSIS — R21 Rash and other nonspecific skin eruption: Secondary | ICD-10-CM

## 2014-01-09 MED ORDER — TRAMADOL HCL 50 MG PO TABS: 50.0000 mg | ORAL_TABLET | Freq: Three times a day (TID) | ORAL | Status: DC | PRN

## 2014-01-09 MED ORDER — KETOCONAZOLE 2 % EX SHAM: 1.0000 "application " | MEDICATED_SHAMPOO | CUTANEOUS | Status: DC

## 2014-01-09 MED ORDER — CYCLOBENZAPRINE HCL 10 MG PO TABS: 10.0000 mg | ORAL_TABLET | Freq: Three times a day (TID) | ORAL | Status: DC | PRN

## 2014-01-09 NOTE — Progress Notes (Signed)
Pre visit review using our clinic review tool, if applicable. No additional management support is needed unless otherwise documented below in the visit note. 

## 2014-01-09 NOTE — Patient Instructions (Addendum)
-  heat for 15 minutes twice daily  -exercises at least 4 days weeks per week  -As we discussed, we have prescribed a new medication for you at this appointment. We discussed the common and serious potential adverse effects of this medication and you can review these and more with the pharmacist when you pick up your medication.  Please follow the instructions for use carefully and notify us immediately if you have any problems taking this medication.  -see the urologist  -follow up in 4-6 weeks

## 2014-01-09 NOTE — Progress Notes (Signed)
Chief Complaint  Patient presents with  . Follow-up  . Back Pain    HPI:   Follow up:  Back Pain: -started about 1.5 weeks ago after lifting a refrigerator -pain is moderate in R low back with certain movements -hurts when goes to stand up and with bending over -better with rest and after being up for a while -denies: fevers, malaise, weakness, numbness, bowel or bladder incontinence -hx of occ low back pain flares  HTN: -restarted lisinopril last visit -stable  Rash: -trial of ketoconazole and steroid cream last visit -lost half of the shampoo and wants refill -doing better  Hyperlipidemia: -diet and exercise advised  Hematuria: -pt was to see urologist, likely from renal calculi - he reports is going to do this  ROS: See pertinent positives and negatives per HPI.  Past Medical History  Diagnosis Date  . Kidney calculi   . Hypertension     Past Surgical History  Procedure Laterality Date  . Knee surgery    . Lithotripsy      Family History  Problem Relation Age of Onset  . Hypertension Mother   . Hypertension Father   . Heart disease Father   . COPD Father   . Hypertension Sister   . Kidney disease Brother   . Kidney disease Daughter     History   Social History  . Marital Status: Married    Spouse Name: N/A    Number of Children: N/A  . Years of Education: N/A   Social History Main Topics  . Smoking status: Former Smoker -- 1.00 packs/day  . Smokeless tobacco: Never Used     Comment: smoked for 11 years, pack per day  . Alcohol Use: Yes     Comment: rare  . Drug Use: None  . Sexual Activity: None   Other Topics Concern  . 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 Pain, Disp: , Rfl: ;  ketoconazole (NIZORAL) 2 % shampoo, Apply 1 application topically 2 (two) times a week., Disp: 120 mL, Rfl: 0;  lisinopril (PRINIVIL,ZESTRIL) 10 MG tablet, Take 1 tablet (10 mg total) by mouth daily., Disp: 90 tablet, Rfl: 3 triamcinolone cream (KENALOG) 0.1 %, Apply 1 application topically 2 (two) times daily., Disp: 30 g, Rfl: 0;  cyclobenzaprine (FLEXERIL) 10 MG tablet, Take 1 tablet (10 mg total) by mouth 3 (three) times daily as needed for muscle spasms., Disp: 15 tablet, Rfl: 0;  traMADol (ULTRAM) 50 MG tablet, Take 1 tablet (50 mg total) by mouth every 8 (eight) hours as needed., Disp: 20 tablet, Rfl: 0  EXAM:  Filed Vitals:   01/09/14 0912  BP: 130/84  Temp: 98.5 F (36.9 C)    Body mass index is 45.79 kg/(m^2).  GENERAL: vitals reviewed and listed above, alert, oriented, appears well hydrated and in no acute distress  HEENT: atraumatic, conjunttiva clear, no obvious abnormalities on inspection of external nose and ears  NECK: no obvious masses on inspection  MS: moves all extremities without noticeable abnormality  Normal Gait Normal inspection of back, no obvious scoliosis or leg  length descrepancy No bony TTP Soft tissue TTP at: R lumbar paraspinal muscles -/+ tests: neg trendelenburg,+facet loading R, -SLRT, -CLRT, -FABER, -FADIR Normal muscle strength, sensation to light touch and DTRs in LEs bilaterally  PSYCH: pleasant and cooperative, no obvious depression or anxiety  ASSESSMENT AND PLAN:  Discussed the following assessment and plan:  Low back pain - Plan: traMADol (ULTRAM) 50 MG tablet, cyclobenzaprine (FLEXERIL) 10 MG tablet  Gross hematuria  Essential hypertension, benign  Rash and nonspecific skin eruption - Plan: ketoconazole (NIZORAL) 2 % shampoo  --we discussed possible serious and likely etiologies for back pain, workup and treatment, treatment risks and return precautions -after this  discussion, Momen opted for conservative tx at home -follow up advised in 4-6 weeks -of course, we advised Kip  to return or notify a doctor immediately if symptoms worsen or persist or new concerns arise. -other medical issues improved or stable -atient advised to return or notify a doctor immediately if symptoms worsen or persist or new concerns arise.  Patient Instructions  -heat for 15 minutes twice daily  -exercises at least 4 days weeks per week  -As we discussed, we have prescribed a new medication for you at this appointment. We discussed the common and serious potential adverse effects of this medication and you can review these and more with the pharmacist when you pick up your medication.  Please follow the instructions for use carefully and notify us immediately if you have any problems taking this medication.  -see the urologist  -follow up in 4-6 weeks     Sloane Junkin R.

## 2014-02-20 ENCOUNTER — Encounter: Payer: Self-pay | Admitting: Family Medicine

## 2014-02-20 ENCOUNTER — Ambulatory Visit (INDEPENDENT_AMBULATORY_CARE_PROVIDER_SITE_OTHER): Payer: 59 | Admitting: Family Medicine

## 2014-02-20 VITALS — BP 110/80 | Temp 98.7°F | Wt 321.0 lb

## 2014-02-20 DIAGNOSIS — E785 Hyperlipidemia, unspecified: Secondary | ICD-10-CM

## 2014-02-20 DIAGNOSIS — R310 Gross hematuria: Secondary | ICD-10-CM

## 2014-02-20 DIAGNOSIS — R21 Rash and other nonspecific skin eruption: Secondary | ICD-10-CM

## 2014-02-20 NOTE — Patient Instructions (Addendum)
-  use the flexeril prn  -continue exercises, call in one month if not resolved and will place referral  -call your urologist for follow up apptoinment  -for GERD: -do the lifestyle recs per below -restart prilosec or nexium -follow up in 3 months  Diet for Gastroesophageal Reflux Disease, Adult Reflux (acid reflux) is when acid from your stomach flows up into the esophagus. When acid comes in contact with the esophagus, the acid causes irritation and soreness (inflammation) in the esophagus. When reflux happens often or so severely that it causes damage to the esophagus, it is called gastroesophageal reflux disease (GERD). Nutrition therapy can help ease the discomfort of GERD. FOODS OR DRINKS TO AVOID OR LIMIT  Smoking or chewing tobacco. Nicotine is one of the most potent stimulants to acid production in the gastrointestinal tract.  Caffeinated and decaffeinated coffee and black tea.  Regular or low-calorie carbonated beverages or energy drinks (caffeine-free carbonated beverages are allowed).   Strong spices, such as black pepper, white pepper, red pepper, cayenne, curry powder, and chili powder.  Peppermint or spearmint.  Chocolate.  High-fat foods, including meats and fried foods. Extra added fats including oils, butter, salad dressings, and nuts. Limit these to less than 8 tsp per day.  Fruits and vegetables if they are not tolerated, such as citrus fruits or tomatoes.  Alcohol.  Any food that seems to aggravate your condition. If you have questions regarding your diet, call your caregiver or a registered dietitian. OTHER THINGS THAT MAY HELP GERD INCLUDE:   Eating your meals slowly, in a relaxed setting.  Eating 5 to 6 small meals per day instead of 3 large meals.  Eliminating food for a period of time if it causes distress.  Not lying down until 3 hours after eating a meal.  Keeping the head of your bed raised 6 to 9 inches (15 to 23 cm) by using a foam wedge or  blocks under the legs of the bed. Lying flat may make symptoms worse.  Being physically active. Weight loss may be helpful in reducing reflux in overweight or obese adults.  Wear loose fitting clothing EXAMPLE MEAL PLAN This meal plan is approximately 2,000 calories based on https://www.[place].org/ChooseMyPlate.gov meal planning guidelines. Breakfast   cup cooked oatmeal.  1 cup strawberries.  1 cup low-fat milk.  1 oz almonds. Snack  1 cup cucumber slices.  6 oz yogurt (made from low-fat or fat-free milk). Lunch  2 slice whole-wheat bread.  2 oz sliced [place]urkey.  2 tsp mayonnaise.  1 cup blueberries.  1 cup snap peas. Snack  6 whole-wheat crackers.  1 oz string cheese. Dinner   cup brown rice.  1 cup mixed veggies.  1 tsp olive oil.  3 oz grilled fish. Document Released: 11/07/2005 Document Revised: 01/30/2012 Document Reviewed: 09/23/2011 [facility] Patient Information 2014 [place], [place].

## 2014-02-20 NOTE — Progress Notes (Signed)
Chief Complaint  Patient presents with  . 5 week follow up    HPI:  Follow up:  Back Pain: -R low back after lifting refridgerator -tx with HEP, flexeril, tramadol -reports: doing much better, occ issues with certain movement -denies: radiation, bowel or bladder issues, weakness or numbness  Hematuria: -was advised to follow up with his urologist, reports has not called yet but will -reports:  Skin Rash: -reports: resolved  GERD: -chronic, but worsening -tried nexium OTC and worked great but then recurred when stopped after 2 weeks -occurs when lies down at night, feel reflux in mouth -denies: weight loss, dysphagia, vomiting  ROS: See pertinent positives and negatives per HPI.  Past Medical History  Diagnosis Date  . Kidney calculi   . Hypertension     Past Surgical History  Procedure Laterality Date  . Knee surgery    . Lithotripsy      Family History  Problem Relation Age of Onset  . Hypertension Mother   . Hypertension Father   . Heart disease Father   . COPD Father   . Hypertension Sister   . Kidney disease Brother   . Kidney disease Daughter     History   Social History  . Marital Status: Married    Spouse Name: N/A    Number of Children: N/A  . Years of Education: N/A   Social History Main Topics  . Smoking status: Former Smoker -- 1.00 packs/day  . Smokeless tobacco: Never Used     Comment: smoked for 11 years, pack per day  . Alcohol Use: Yes     Comment: rare  . Drug Use: None  . Sexual Activity: None   Other Topics Concern  . None   Social History Narrative   Work or School: Environmental health practitioner Situation: lives with wife and 3 children      Spiritual Beliefs: Christian      Lifestyle: No regular CV exercise; diet is fair             Current outpatient prescriptions:cyclobenzaprine (FLEXERIL) 10 MG tablet, Take 1 tablet (10 mg total) by mouth 3 (three) times daily as needed for muscle spasms., Disp: 15 tablet,  Rfl: 0;  ketoconazole (NIZORAL) 2 % shampoo, Apply 1 application topically 2 (two) times a week., Disp: 120 mL, Rfl: 0;  lisinopril (PRINIVIL,ZESTRIL) 10 MG tablet, Take 1 tablet (10 mg total) by mouth daily., Disp: 90 tablet, Rfl: 3 traMADol (ULTRAM) 50 MG tablet, Take 1 tablet (50 mg total) by mouth every 8 (eight) hours as needed., Disp: 20 tablet, Rfl: 0;  triamcinolone cream (KENALOG) 0.1 %, Apply 1 application topically 2 (two) times daily., Disp: 30 g, Rfl: 0  EXAM:  Filed Vitals:   02/20/14 1045  BP: 110/80  Temp: 98.7 F (37.1 C)    Body mass index is 46.37 kg/(m^2).  GENERAL: vitals reviewed and listed above, alert, oriented, appears well hydrated and in no acute distress  HEENT: atraumatic, conjunttiva clear, no obvious abnormalities on inspection of external nose and ears  NECK: no obvious masses on inspection    MS: moves all extremities without noticeable abnormality -normal gait, no bony TTP on back, mild spasm LL lumbar paraspinal muscles  PSYCH: pleasant and cooperative, no obvious depression or anxiety  ASSESSMENT AND PLAN:  Discussed the following assessment and plan:  Hyperlipemia  Gross hematuria  Rash and nonspecific skin eruption  -back improved, HEP and follow up in 4 weeks  if not resolved -PPI and diet for reflux - follow up in 3 months -follow up with urologist -Patient advised to return or notify a doctor immediately if symptoms worsen or persist or new concerns arise.  Patient Instructions  -use the flexeril prn  -continue exercises, call in one month if not resolved and will place referral  -call your urologist for follow up apptoinment  -for GERD: -do the lifestyle recs per below -restart prilosec or nexium -follow up in 3 months  Diet for Gastroesophageal Reflux Disease, Adult Reflux (acid reflux) is when acid from your stomach flows up into the esophagus. When acid comes in contact with the esophagus, the acid causes irritation  and soreness (inflammation) in the esophagus. When reflux happens often or so severely that it causes damage to the esophagus, it is called gastroesophageal reflux disease (GERD). Nutrition therapy can help ease the discomfort of GERD. FOODS OR DRINKS TO AVOID OR LIMIT  Smoking or chewing tobacco. Nicotine is one of the most potent stimulants to acid production in the gastrointestinal tract.  Caffeinated and decaffeinated coffee and black tea.  Regular or low-calorie carbonated beverages or energy drinks (caffeine-free carbonated beverages are allowed).   Strong spices, such as black pepper, white pepper, red pepper, cayenne, curry powder, and chili powder.  Peppermint or spearmint.  Chocolate.  High-fat foods, including meats and fried foods. Extra added fats including oils, butter, salad dressings, and nuts. Limit these to less than 8 tsp per day.  Fruits and vegetables if they are not tolerated, such as citrus fruits or tomatoes.  Alcohol.  Any food that seems to aggravate your condition. If you have questions regarding your diet, call your caregiver or a registered dietitian. OTHER THINGS THAT MAY HELP GERD INCLUDE:   Eating your meals slowly, in a relaxed setting.  Eating 5 to 6 small meals per day instead of 3 large meals.  Eliminating food for a period of time if it causes distress.  Not lying down until 3 hours after eating a meal.  Keeping the head of your bed raised 6 to 9 inches (15 to 23 cm) by using a foam wedge or blocks under the legs of the bed. Lying flat may make symptoms worse.  Being physically active. Weight loss may be helpful in reducing reflux in overweight or obese adults.  Wear loose fitting clothing EXAMPLE MEAL PLAN This meal plan is approximately 2,000 calories based on https://www.[place].org/ChooseMyPlate.gov meal planning guidelines. Breakfast   cup cooked oatmeal.  1 cup strawberries.  1 cup low-fat milk.  1 oz almonds. Snack  1 cup cucumber slices.  6  oz yogurt (made from low-fat or fat-free milk). Lunch  2 slice whole-wheat bread.  2 oz sliced [place]key.  2 tsp mayonnaise.  1 cup blueberries.  1 cup snap peas. Snack  6 whole-wheat crackers.  1 oz string cheese. Dinner   cup brown rice.  1 cup mixed veggies.  1 tsp olive oil.  3 oz grilled fish. Document Released: 11/07/2005 Document Revised: 01/30/2012 Document Reviewed: 09/23/2011 [facility] Patient Information 2014 [place]Exit[place], [place]ettleLLC.      Jahyra Sukup, Dahlia ClientHANNAH R.

## 2014-02-20 NOTE — Progress Notes (Signed)
Pre visit review using our clinic review tool, if applicable. No additional management support is needed unless otherwise documented below in the visit note. 

## 2014-03-17 ENCOUNTER — Other Ambulatory Visit: Payer: Self-pay | Admitting: Urology

## 2014-03-17 DIAGNOSIS — N200 Calculus of kidney: Secondary | ICD-10-CM

## 2014-03-21 ENCOUNTER — Encounter (HOSPITAL_COMMUNITY): Payer: Self-pay | Admitting: Pharmacy Technician

## 2014-03-26 ENCOUNTER — Encounter (HOSPITAL_COMMUNITY): Payer: Self-pay | Admitting: Pharmacy Technician

## 2014-03-27 NOTE — Patient Instructions (Signed)
20 Timothy Pratt  03/27/2014   Your procedure is scheduled on: 04/03/14  [facility].  Call this number if you have problems the morning of surgery [phone]: [phone]   Remember:   Do not eat food or drink liquids After Midnight.     Take these medicines the morning of surgery with A SIP OF WATER: nexium   Do not wear jewelry, make-up or nail polish.  Do not wear lotions, powders, or perfumes. You may wear deodorant.  Do not shave 48 hours prior to surgery. Men may shave face and neck.  Do not bring valuables to the hospital.  Contacts, dentures or bridgework may not be worn into surgery.  Leave suitcase in the car. After surgery it may be brought to your room.  For patients admitted to the hospital, checkout time is 11:00 AM the day of discharge.   Timothy Sonsachel Pablo Stauffer, RN  pre op nurse call if needed [phone]    [facility] Pre[facility] Before surgery, you can play an important role.  Because skin is not sterile, your skin needs to be as free of germs as possible.  You can reduce the number of germs on your skin by washing with CHG (chlorahexidine gluconate) soap before surgery.  CHG is an antiseptic cleaner which kills germs and bonds with the skin to continue killing germs even after washing. Please DO NOT use if you have an allergy to CHG or antibacterial soaps.  If your skin becomes reddened/irritated stop using the CHG and inform your nurse when you arrive at Short Stay. Do not shave (including legs and underarms) for at least 48 hours prior to the first CHG shower.  You may shave your face. Please follow these instructions carefully:  1.  Shower with CHG Soap the night before surgery and the  morning of Surgery.  2.  If you choose to wash your hair, wash your hair first as usual with your  normal  shampoo.  3.  After you shampoo, rinse your hair and body thoroughly to remove the  shampoo.                            4.  Use CHG as  you would any other liquid soap.  You can apply chg directly  to the skin and wash                       Gently with a scrungie or clean washcloth.  5.  Apply the CHG Soap to your body ONLY FROM THE NECK DOWN.   Do not use on open                           Wound or open sores. Avoid contact with eyes, ears mouth and genitals (private parts).                        Genitals (private parts) with your normal soap.             6.  Wash thoroughly, paying special attention to the area where your surgery  will be performed.  7.  Thoroughly rinse your body with warm water from the neck down.  8.  DO NOT shower/wash with your normal soap after using and rinsing off  the CHG Soap.  9.  Pat yourself dry with a clean towel.            10.  Wear clean pajamas.            11.  Place clean sheets on your bed the night of your first shower and do not  sleep with pets. Day of Surgery : Do not apply any lotions/deodorants the morning of surgery.  Please wear clean clothes to the hospital/surgery center.  FAILURE TO FOLLOW THESE INSTRUCTIONS MAY RESULT IN THE CANCELLATION OF YOUR SURGERY PATIENT SIGNATURE_________________________________  NURSE SIGNATURE__________________________________  ________________________________________________________________________  WHAT IS A BLOOD TRANSFUSION? Blood Transfusion Information  A transfusion is the replacement of blood or some of its parts. Blood is made up of multiple cells which provide different functions.  Red blood cells carry oxygen and are used for blood loss replacement.  White blood cells fight against infection.  Platelets control bleeding.  Plasma helps clot blood.  Other blood products are available for specialized needs, such as hemophilia or other clotting disorders. BEFORE THE TRANSFUSION  Who gives blood for transfusions?   Healthy volunteers who are fully evaluated to make sure their blood is safe. This is blood bank  blood. Transfusion therapy is the safest it has ever been in the practice of medicine. Before blood is taken from a donor, a complete history is taken to make sure that person has no history of diseases nor engages in risky social behavior (examples are intravenous drug use or sexual activity with multiple partners). The donor's travel history is screened to minimize risk of transmitting infections, such as malaria. The donated blood is tested for signs of infectious diseases, such as HIV and hepatitis. The blood is then tested to be sure it is compatible with you in order to minimize the chance of a transfusion reaction. If you or a relative donates blood, this is often done in anticipation of surgery and is not appropriate for emergency situations. It takes many days to process the donated blood. RISKS AND COMPLICATIONS Although transfusion therapy is very safe and saves many lives, the main dangers of transfusion include:   Getting an infectious disease.  Developing a transfusion reaction. This is an allergic reaction to something in the blood you were given. Every precaution is taken to prevent this. The decision to have a blood transfusion has been considered carefully by your caregiver before blood is given. Blood is not given unless the benefits outweigh the risks. AFTER THE TRANSFUSION  Right after receiving a blood transfusion, you will usually feel much better and more energetic. This is especially true if your red blood cells have gotten low (anemic). The transfusion raises the level of the red blood cells which carry oxygen, and this usually causes an energy increase.  The nurse administering the transfusion will monitor you carefully for complications. HOME CARE INSTRUCTIONS  No special instructions are needed after a transfusion. You may find your energy is better. Speak with your caregiver about any limitations on activity for underlying diseases you may have. SEEK MEDICAL CARE IF:    Your condition is not improving after your transfusion.  You develop redness or irritation at the intravenous (IV) site. SEEK IMMEDIATE MEDICAL CARE IF:  Any of the following symptoms occur over the next 12 hours:  Shaking chills.  You have a temperature by mouth above 102 F (38.9 C), not controlled by medicine.  Chest, back, or muscle pain.  People around you feel you are not acting correctly or  are confused.  Shortness of breath or difficulty breathing.  Dizziness and fainting.  You get a rash or develop hives.  You have a decrease in urine output.  Your urine turns a dark color or changes to pink, red, or brown. Any of the following symptoms occur over the next 10 days:  You have a temperature by mouth above 102 F (38.9 C), not controlled by medicine.  Shortness of breath.  Weakness after normal activity.  The white part of the eye turns yellow (jaundice).  You have a decrease in the amount of urine or are urinating less often.  Your urine turns a dark color or changes to pink, red, or brown. Document Released: 11/04/2000 Document Revised: 01/30/2012 Document Reviewed: 06/23/2008 [facility] Patient Information 2014 [place], [place].  _______________________________________________________________________

## 2014-03-31 ENCOUNTER — Encounter (HOSPITAL_COMMUNITY): Payer: Self-pay

## 2014-03-31 ENCOUNTER — Ambulatory Visit (HOSPITAL_COMMUNITY)
Admission: RE | Admit: 2014-03-31 | Discharge: 2014-03-31 | Disposition: A | Discharge status: Home / Self Care | Payer: 59 | Source: Ambulatory Visit | Attending: Anesthesiology | Admitting: Anesthesiology

## 2014-03-31 ENCOUNTER — Encounter (HOSPITAL_COMMUNITY)
Admission: RE | Admit: 2014-03-31 | Discharge: 2014-03-31 | Disposition: A | Discharge status: Home / Self Care | Payer: 59 | Source: Ambulatory Visit | Attending: Urology | Admitting: Urology

## 2014-03-31 DIAGNOSIS — Z01810 Encounter for preprocedural cardiovascular examination: Secondary | ICD-10-CM | POA: Insufficient documentation

## 2014-03-31 DIAGNOSIS — Z01812 Encounter for preprocedural laboratory examination: Secondary | ICD-10-CM | POA: Insufficient documentation

## 2014-03-31 DIAGNOSIS — Z01818 Encounter for other preprocedural examination: Secondary | ICD-10-CM | POA: Insufficient documentation

## 2014-03-31 HISTORY — DX: Gastro-esophageal reflux disease without esophagitis: K21.9

## 2014-03-31 HISTORY — DX: Sleep apnea, unspecified: G47.30

## 2014-03-31 LAB — BASIC METABOLIC PANEL
BUN: 10 mg/dL (ref 6–23)
CALCIUM: 9.3 mg/dL (ref 8.4–10.5)
CO2: 30 meq/L (ref 19–32)
CREATININE: 0.92 mg/dL (ref 0.50–1.35)
Chloride: 101 mEq/L (ref 96–112)
GFR calc Af Amer: >90 mL/min (ref >90)
Glucose, Bld: 84 mg/dL (ref 70–99)
Potassium: 4.5 mEq/L (ref 3.7–5.3)
SODIUM: 141 meq/L (ref 137–147)

## 2014-03-31 LAB — CBC
HCT: 44.5 % (ref 39.0–52.0)
Hemoglobin: 15.5 g/dL (ref 13.0–17.0)
MCH: 30.9 pg (ref 26.0–34.0)
MCHC: 34.8 g/dL (ref 30.0–36.0)
MCV: 88.8 fL (ref 78.0–100.0)
PLATELETS: 207 10*3/uL (ref 150–400)
RBC: 5.01 MIL/uL (ref 4.22–5.81)
RDW: 12.9 % (ref 11.5–15.5)
WBC: 10.1 10*3/uL (ref 4.0–10.5)

## 2014-03-31 LAB — ABO/RH: ABO/RH(D): O POS

## 2014-03-31 IMAGING — CR DG CHEST 2V
2 series · 2 of 2 positions shown · non-contrast
Comparison: [DATE]

CLINICAL DATA: Preop for left lithotripsy

EXAM:
CHEST  2 VIEW

[w chest pa *]
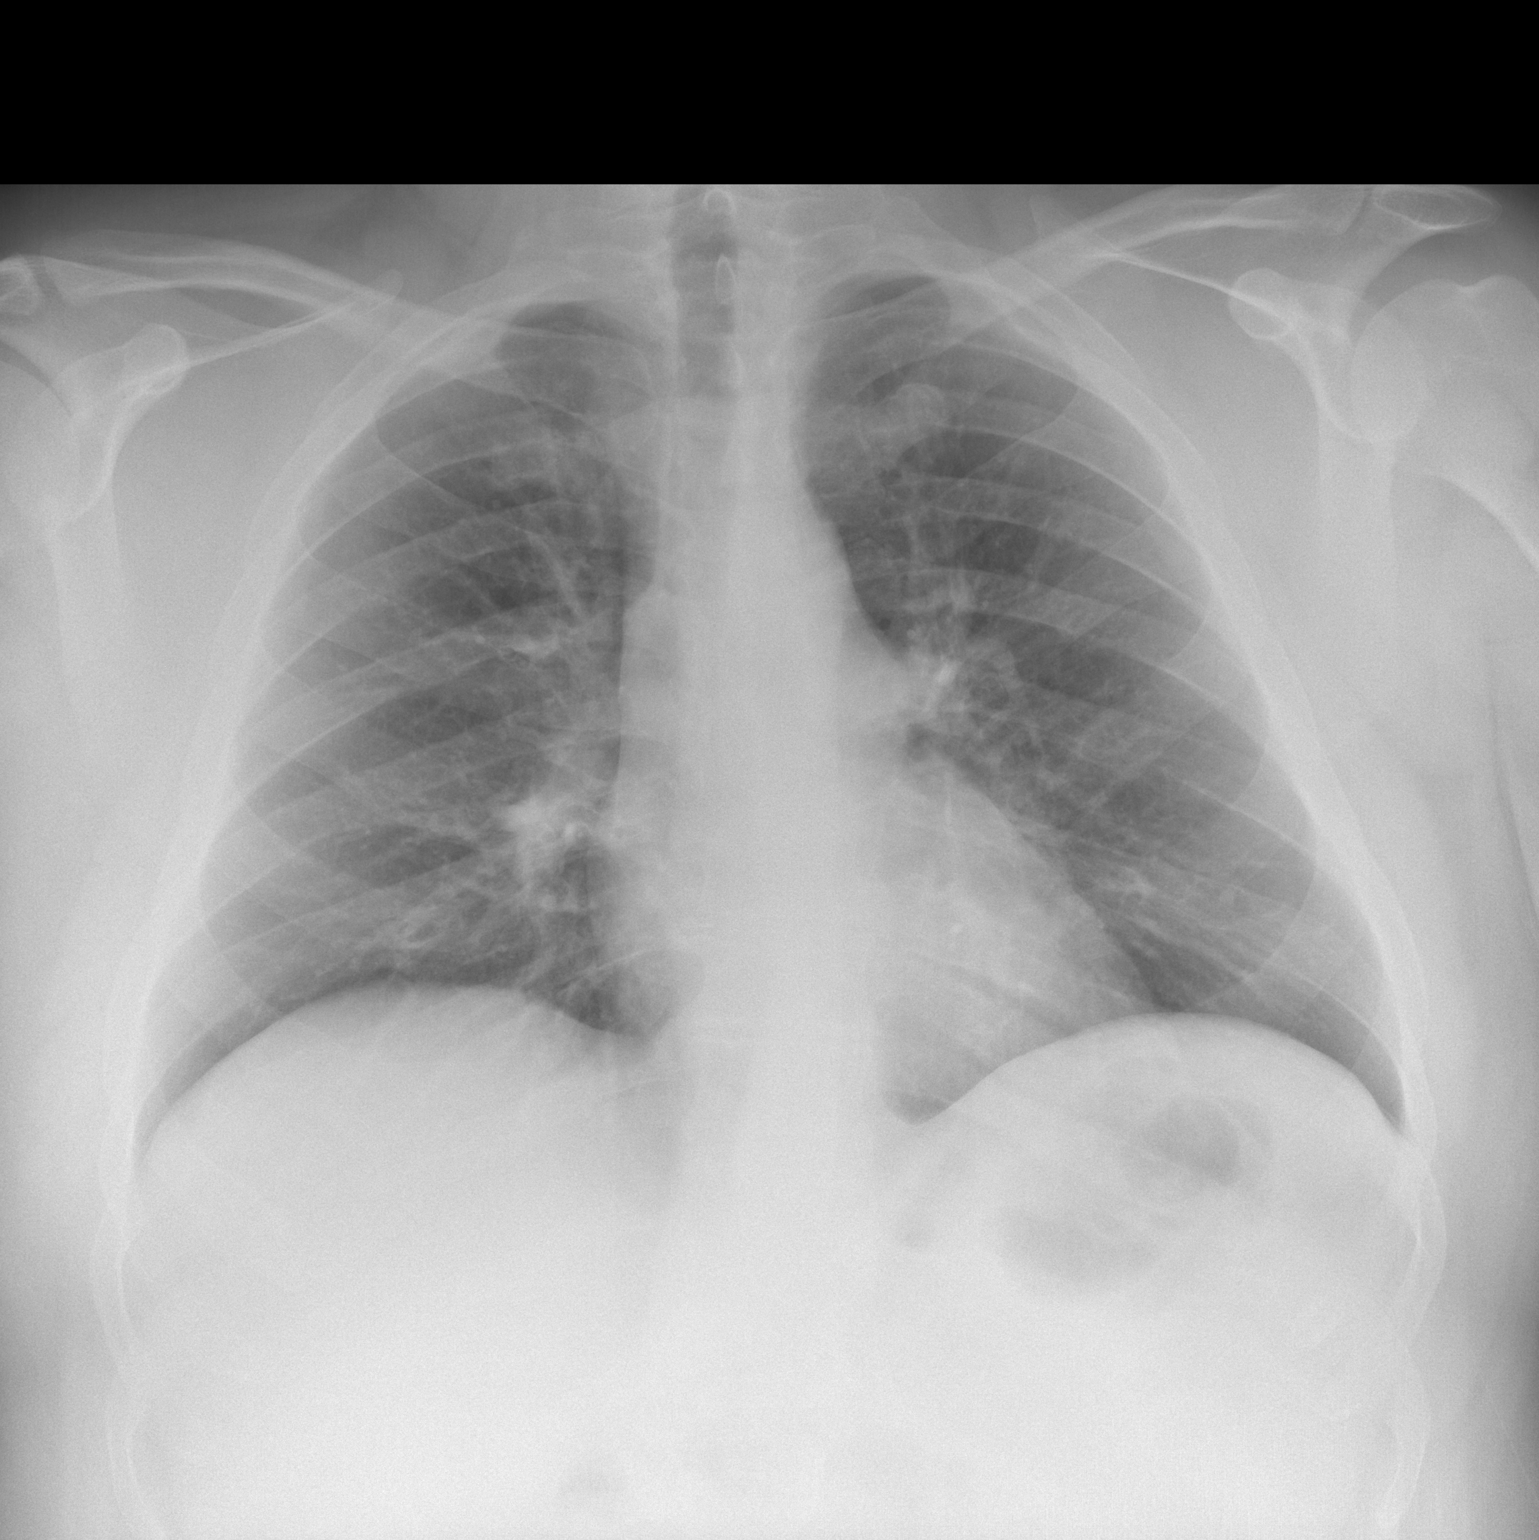

[w chest lat *]
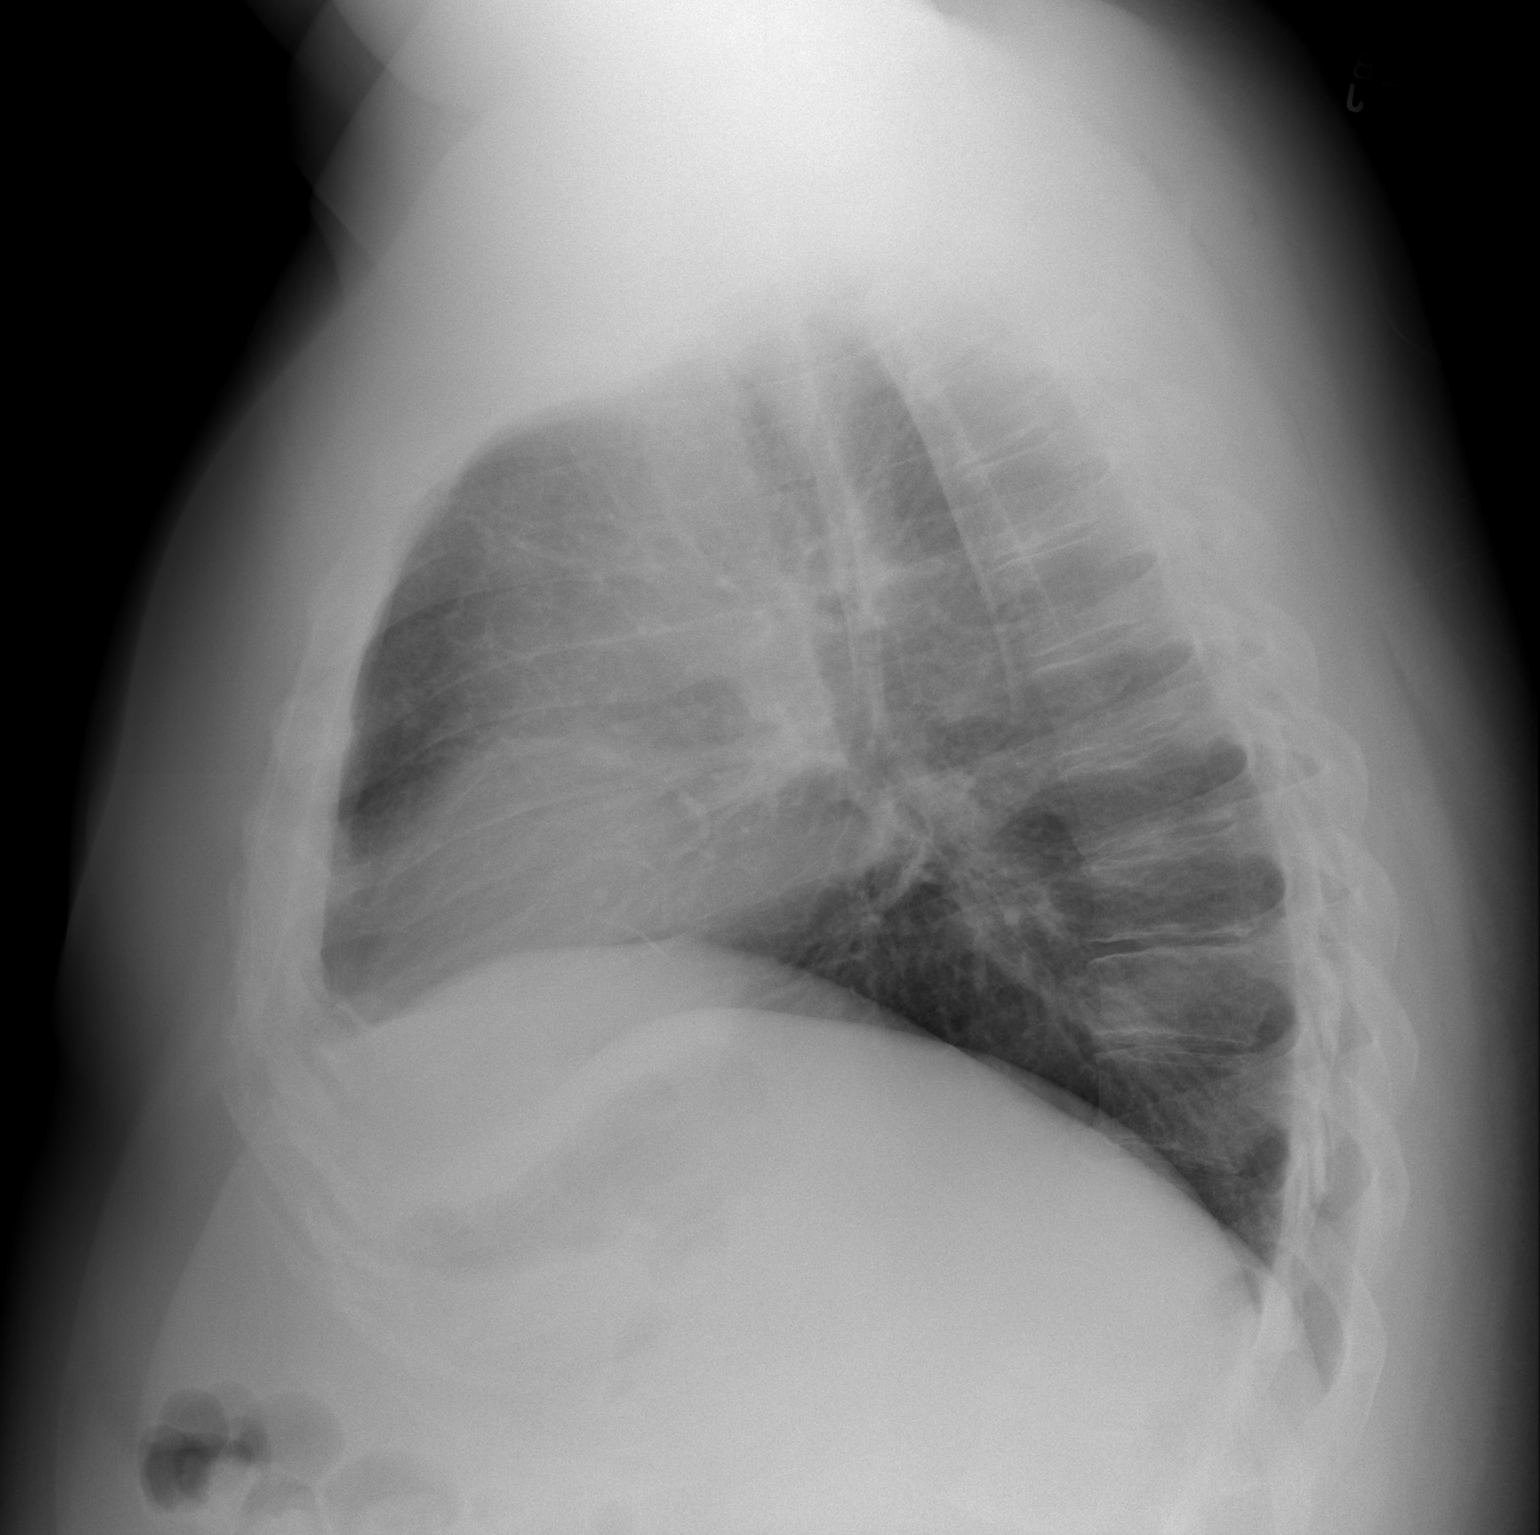

[2 of 2 positions shown; findings below may reference images not displayed]

FINDINGS: Cardiomediastinal silhouette is stable. No acute infiltrate or
pleural effusion. No pulmonary edema. Bony thorax is unremarkable.
IMPRESSION: No active cardiopulmonary disease.

## 2014-04-01 ENCOUNTER — Other Ambulatory Visit: Payer: Self-pay | Admitting: Radiology

## 2014-04-02 MED ORDER — DEXTROSE 5 % IV SOLN
3.0000 g | INTRAVENOUS | Status: DC
  Filled 2014-04-02: qty 3000

## 2014-04-02 NOTE — H&P (Signed)
ctive Problems Problems   1. Flank pain (789.09)  2. Gross hematuria (599.71)  3. Kidney stone on left side (592.0)  History of Present Illness  Timothy Pratt is a former patient of the practice with a history of stones who is sent back in consultation by Dr. Kriste BasqueHannah Kim for pain and hematuria.   Over the last 2 months if he does lifting at work he will have gross hematuria with right flank pain.  The pain can be severe with nausea.  He has no voiding complaints.  He can have some testicular pain.  He has no other associated signs or symptoms.   Past Medical History Problems   1. History of Anxiety (300.00)  2. History of esophageal reflux (V12.79)  3. History of gout (V12.29)  4. History of hypertension (V12.59)  5. History of sleep apnea (V13.89)  Surgical History Problems   1. History of Kidney Surgery  Current Meds  1. Lisinopril 10 MG Oral Tablet;  Therapy: (Recorded:24Apr2015) to Recorded  Allergies Medication   1. No Known Drug Allergies  Family History Problems   1. Family history of kidney stones (V18.69) : Father, Sibling  2. 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  Genitourinary: nocturia and hematuria.  Gastrointestinal: flank pain and heartburn.  Musculoskeletal: back pain.    Vitals Vital Signs [Data Includes: Last 1 Day]  Recorded: 24Apr2015 04:51PM  Blood Pressure: 182 / 110 Recorded: 24Apr2015 03:54PM  Height: 5 ft 11 in Weight: 305 lb  BMI Calculated: 42.54 BSA Calculated: 2.52 Blood Pressure: 166 / 112 Heart Rate: 81  Physical Exam Constitutional: Well  nourished and well developed . No acute distress.  ENT:. The ears and nose are normal in appearance.  Neck: The appearance of the neck is normal and no neck mass is present.  Pulmonary: No respiratory distress and normal respiratory rhythm and effort.  Cardiovascular: Heart rate and rhythm are normal . No peripheral edema.  Abdomen: The abdomen is obese. No masses are palpated. The abdomen is not firm, not rigid and no guarding. Mild tenderness in the RUQ is present. No CVA tenderness. No hernias are palpable. No hepatosplenomegaly noted.  Genitourinary: Examination of the penis demonstrates no discharge, no masses, no lesions and a normal meatus. The scrotum is without lesions. The right epididymis is palpably normal and non-tender. The left epididymis is palpably normal and non-tender. The right testis is non-tender and without masses. The left testis is non-tender and without masses.  Lymphatics: The supraclavicular, femoral and inguinal nodes are not enlarged or tender.  Skin: Normal skin turgor, no visible rash and no visible skin lesions.  Neuro/Psych:. Mood and affect are appropriate.    Results/Data Urine [Data Includes: Last 1 Day]   24Apr2015  COLOR YELLOW   APPEARANCE CLEAR   SPECIFIC GRAVITY 1.020   pH 6.5   GLUCOSE NEG mg/dL  BILIRUBIN NEG   KETONE NEG mg/dL  BLOOD SMALL   PROTEIN NEG mg/dL  UROBILINOGEN 0.2 mg/dL  NITRITE NEG   LEUKOCYTE ESTERASE NEG   SQUAMOUS EPITHELIAL/HPF RARE   WBC 0-2 WBC/hpf  RBC 3-6 RBC/hpf  BACTERIA NONE SEEN   CRYSTALS NONE SEEN   CASTS NONE SEEN  The following images/tracing/specimen were independently visualized:  CT shows a 12 x 60mm stone in the left proximal ureter and renal pelvis with minimal dilation. There is a small right renal stone. No ureteral stones are seen. The stone is 900HU but I don't see it on the scout because of his size. The skin to stone distance is 14cm at the lower pole.  The following clinical lab reports were  reviewed:  UA reviewed.    Assessment Assessed   1. Gross hematuria (599.71)  2. Flank pain (789.09)  3. Kidney stone on left side (592.0)   He has a 1.2 x 6 cm stone in the left proximal ureter and kidney that is the probable source of the hematuria.  He has a small right renal stone but I am not sure why he has had right flank pain.   Plan Gross hematuria   1. AU CT-STONE PROTOCOL; Status:In Progress - Specimen/Data Collected;   Done:  24Apr2015 12:00AM Health Maintenance   2. UA With REFLEX; [Do Not Release]; Status:Resulted - Requires Verification;   Done:  24Apr2015 03:30PM Kidney stone on left side   3. Start: Oxycodone-Acetaminophen 10-325 MG Oral Tablet; TAKE 1 TABLET Every 6 hours  PRN  4. Follow-up Schedule Surgery Office  Follow-up  Status: Hold For - Appointment   Requested for: 24Apr2015   I discussed the options of ureteroscopy, ESWL and PCNL and because of the size and location and his prior negative experience with a stent, he will be set up for left PCNL.   I reviewed the risks of bleeding, infection, renal and adjacent organ injury, need for multiple procedures, position injuries, thrombosis events and anesthetic complications.    I have given him a script for percocet for the pain.

## 2014-04-03 ENCOUNTER — Encounter (HOSPITAL_COMMUNITY): Payer: Self-pay | Admitting: Certified Registered Nurse Anesthetist

## 2014-04-03 ENCOUNTER — Ambulatory Visit (HOSPITAL_COMMUNITY): Payer: 59 | Admitting: Anesthesiology

## 2014-04-03 ENCOUNTER — Observation Stay (HOSPITAL_COMMUNITY)
Admission: RE | Admit: 2014-04-03 | Discharge: 2014-04-04 | Disposition: A | Discharge status: Home / Self Care | Payer: 59 | Source: Ambulatory Visit | Attending: Urology | Admitting: Urology

## 2014-04-03 ENCOUNTER — Encounter (HOSPITAL_COMMUNITY): Payer: Self-pay

## 2014-04-03 ENCOUNTER — Ambulatory Visit (HOSPITAL_COMMUNITY): Payer: 59

## 2014-04-03 ENCOUNTER — Encounter (HOSPITAL_COMMUNITY): Payer: Self-pay | Admitting: General Practice

## 2014-04-03 ENCOUNTER — Encounter (HOSPITAL_COMMUNITY)
Admission: RE | Disposition: A | Discharge status: Home / Self Care | Payer: Self-pay | Source: Ambulatory Visit | Attending: Urology

## 2014-04-03 ENCOUNTER — Encounter (HOSPITAL_COMMUNITY): Payer: 59 | Admitting: Anesthesiology

## 2014-04-03 ENCOUNTER — Ambulatory Visit (HOSPITAL_COMMUNITY)
Admission: RE | Admit: 2014-04-03 | Discharge: 2014-04-03 | Disposition: A | Discharge status: Home / Self Care | Payer: 59 | Source: Ambulatory Visit | Attending: Urology | Admitting: Urology

## 2014-04-03 DIAGNOSIS — N200 Calculus of kidney: Secondary | ICD-10-CM

## 2014-04-03 DIAGNOSIS — M109 Gout, unspecified: Secondary | ICD-10-CM | POA: Insufficient documentation

## 2014-04-03 DIAGNOSIS — Z79899 Other long term (current) drug therapy: Secondary | ICD-10-CM | POA: Insufficient documentation

## 2014-04-03 DIAGNOSIS — Z87891 Personal history of nicotine dependence: Secondary | ICD-10-CM | POA: Insufficient documentation

## 2014-04-03 DIAGNOSIS — K219 Gastro-esophageal reflux disease without esophagitis: Secondary | ICD-10-CM | POA: Insufficient documentation

## 2014-04-03 DIAGNOSIS — R109 Unspecified abdominal pain: Secondary | ICD-10-CM | POA: Insufficient documentation

## 2014-04-03 DIAGNOSIS — I10 Essential (primary) hypertension: Secondary | ICD-10-CM | POA: Insufficient documentation

## 2014-04-03 DIAGNOSIS — R310 Gross hematuria: Secondary | ICD-10-CM | POA: Insufficient documentation

## 2014-04-03 DIAGNOSIS — G4730 Sleep apnea, unspecified: Secondary | ICD-10-CM | POA: Insufficient documentation

## 2014-04-03 HISTORY — PX: NEPHROLITHOTOMY: SHX5134

## 2014-04-03 LAB — APTT: aPTT: 32 seconds (ref 24–37)

## 2014-04-03 LAB — PROTIME-INR
INR: 0.91 (ref 0.00–1.49)
Prothrombin Time: 12.1 seconds (ref 11.6–15.2)

## 2014-04-03 LAB — TYPE AND SCREEN
ABO/RH(D): O POS
Antibody Screen: NEGATIVE

## 2014-04-03 LAB — HEMOGLOBIN AND HEMATOCRIT, BLOOD
HEMATOCRIT: 42.5 % (ref 39.0–52.0)
HEMOGLOBIN: 14.3 g/dL (ref 13.0–17.0)

## 2014-04-03 IMAGING — XA IR INTRO URET CATH PERC *L*
5 series · 8 of 8 positions shown · non-contrast
Comparison: CT of the abdomen and pelvis - [DATE]

CLINICAL DATA: Renal stones, access for left percutaneous
nephrolithotomy.

EXAM:
IR INTRO URET CATH PERC*L*
Date: [DATE]
PROCEDURE:
1. Percutaneous puncture of the renal collecting system under
fluoroscopic guidance
2. Placement of a percutaneous nephrostomy tube
TECHNIQUE: Informed written consent was obtained from the {patient} after a
discussion of the risks, benefits, and alternatives to treatment.
The left flank region was prepped with Betadine in a sterile
fashion, and a sterile drape was applied covering the operative
field. A sterile gown and sterile gloves were used for the
procedure. A timeout was performed prior to the initiation of the
procedure.

[Series 1: care single · 1 of 1 slices shown (1 of 4)]
[im 1/1]
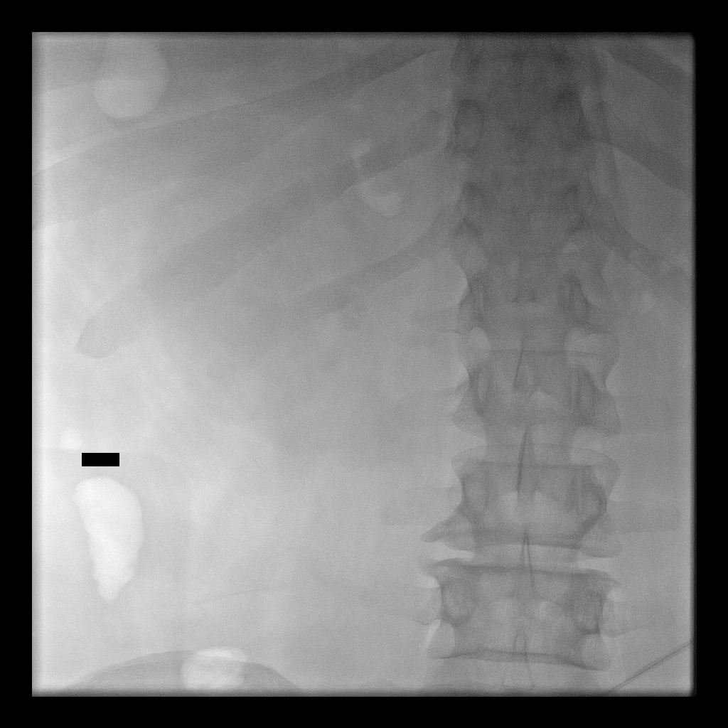

[Series 6: care single · 1 of 1 slices shown (2 of 4)]
[im 1/1]
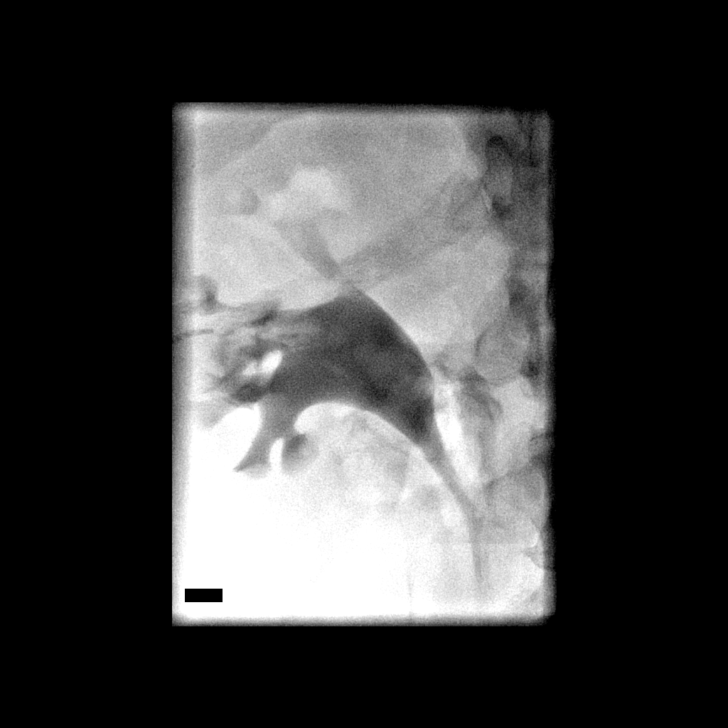

[Series 7: care single · 1 of 1 slices shown (3 of 4)]
[im 1/1]
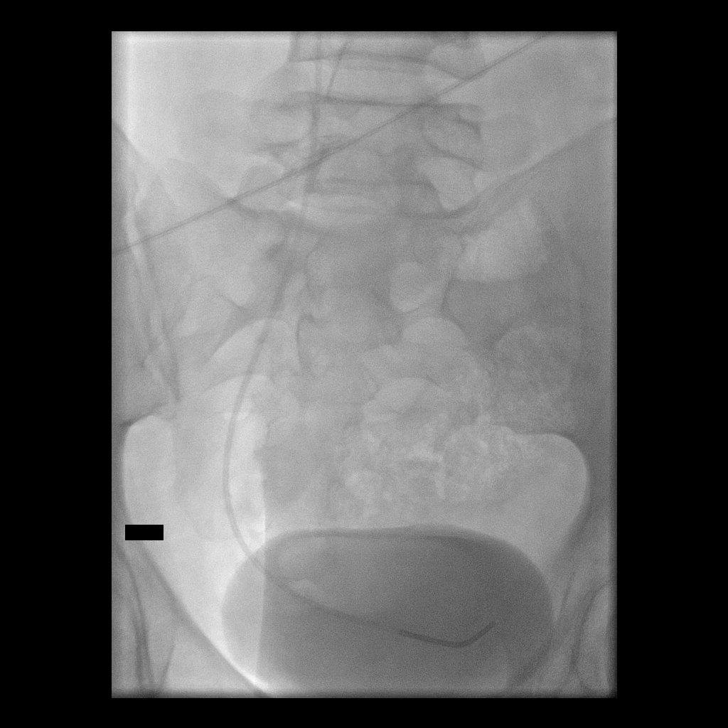

[Series 8: care single · 1 of 1 slices shown (4 of 4)]
[im 1/1]
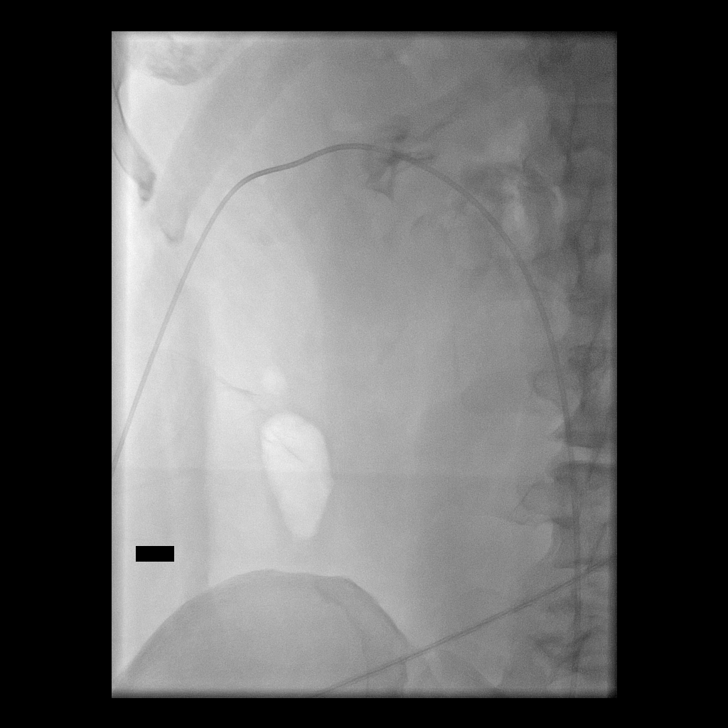

[Series 300: tube placements · 4 of 4 slices shown]
[im 1/4]
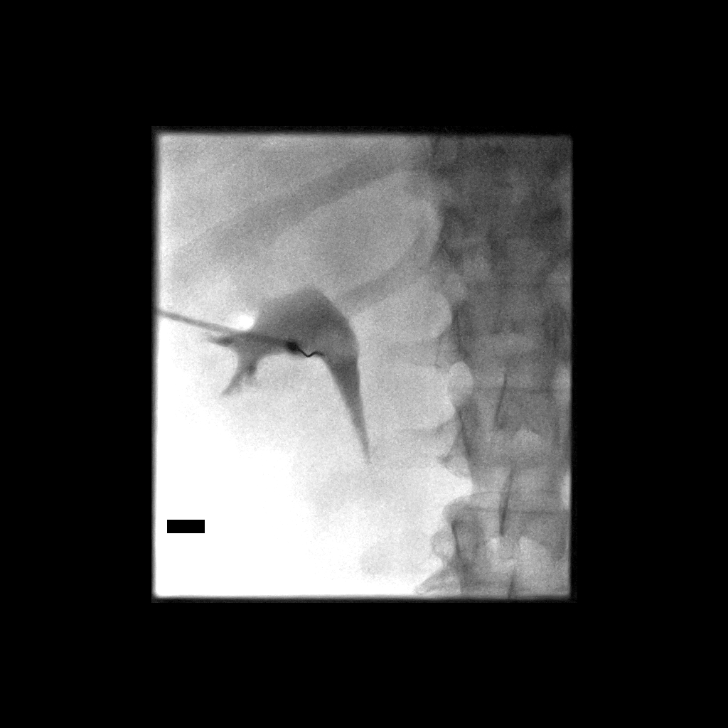
[im 2/4]
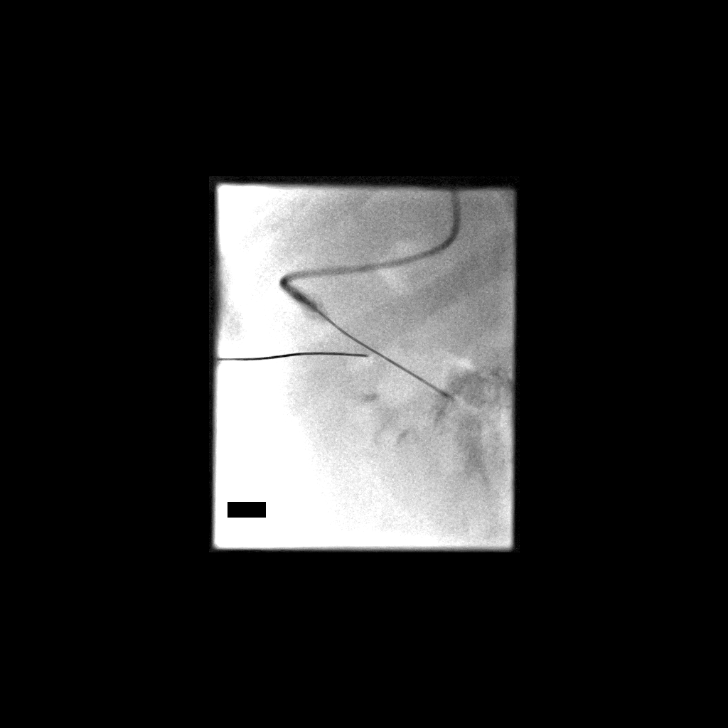
[im 3/4]
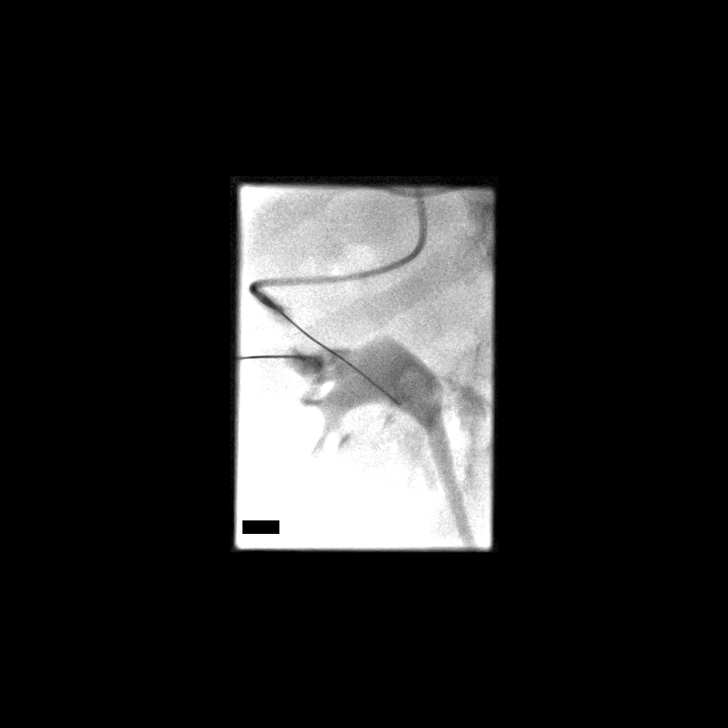
[im 4/4]
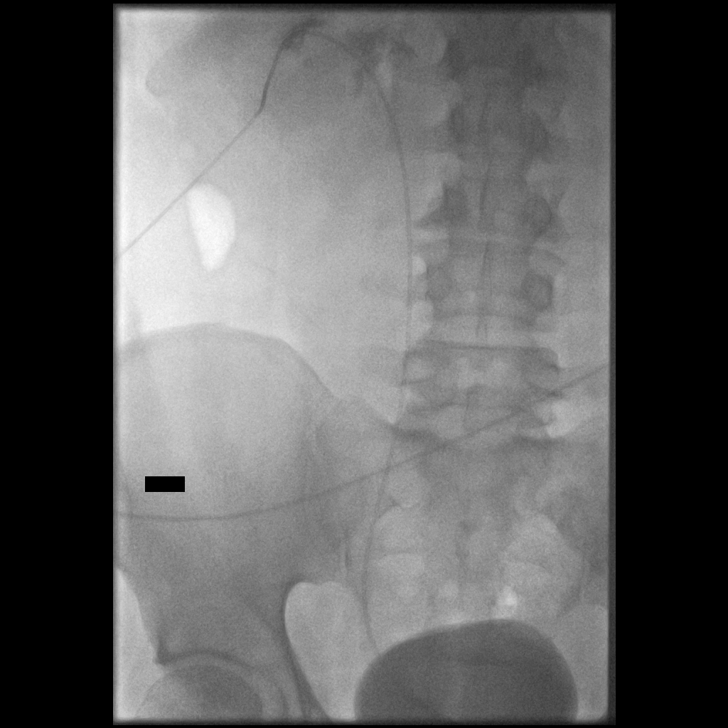

[8 of 8 positions shown; findings below may reference images not displayed]

ANESTHESIA/SEDATION:
Moderate (conscious) sedation was used. Five mg Versed, 200 mcg
Fentanyl were administered intravenously. The patient's vital signs
were monitored continuously by radiology nursing throughout the
procedure. 400 mg Cipro was administered IV at an appropriate time
prior to skin puncture.

Sedation Time: 45 minutes

FLUOROSCOPY TIME:  15 minutes 18 seconds

CONTRAST:  30 mL instilled into the collecting system.
A pre procedural spot fluoroscopic image was obtained of the upper
abdomen. Ultrasound scanning performed of the kidney was negative
for significant hydronephrosis. As such, the stone within left renal
pelvis was targeted fluoroscopically with a 22 gauge Chiba needle.
Return of urine confirmed access within the renal collecting system.
A hand injection of contrast material identified in the renal
collecting system anatomy. A small amount of air was injected into
the collecting system to help delineate a posterior calyx. A
posterior interpolar calyx was targeted with a 22 gauge Chiba
needle. Access to the calyx was confirmed with advancement of a
Nitrex wire into the collecting system. An Accustick set was
utilized to dilate the tract and was subsequently exchanged for a
Kumpe catheter over a Bentson wire. The Kumpe catheter was advanced
down the ureter and into the urinary bladder. Postprocedural spot
radiographs were obtained in various obliquities and the catheter
was sutured to the skin. The catheter was capped and a dressing was
placed. The patient tolerated the procedure well without immediate
postprocedural complication.

COMPLICATIONS:
None immediate
IMPRESSION: Successful fluoroscopic guided left percutaneous nephrostomy with
placement of a 5 French Kumpe catheter to the level of the urinary
bladder to be utilized during impending nephrolithotomy procedure.

## 2014-04-03 SURGERY — NEPHROLITHOTOMY PERCUTANEOUS
Anesthesia: General | Laterality: Left

## 2014-04-03 MED ORDER — DEXAMETHASONE SODIUM PHOSPHATE 10 MG/ML IJ SOLN
INTRAMUSCULAR | Status: AC
  Filled 2014-04-03: qty 1

## 2014-04-03 MED ORDER — NEOSTIGMINE METHYLSULFATE 10 MG/10ML IV SOLN
INTRAVENOUS | Status: DC | PRN
  Administered 2014-04-03: 4 mg via INTRAVENOUS

## 2014-04-03 MED ORDER — NEOSTIGMINE METHYLSULFATE 10 MG/10ML IV SOLN
INTRAVENOUS | Status: AC
  Filled 2014-04-03: qty 1

## 2014-04-03 MED ORDER — STERILE WATER FOR IRRIGATION IR SOLN
Status: DC | PRN
  Administered 2014-04-03: 1

## 2014-04-03 MED ORDER — ONDANSETRON HCL 4 MG/2ML IJ SOLN
INTRAMUSCULAR | Status: DC | PRN
  Administered 2014-04-03: 4 mg via INTRAVENOUS

## 2014-04-03 MED ORDER — LIDOCAINE HCL 1 % IJ SOLN
INTRAMUSCULAR | Status: AC
  Filled 2014-04-03: qty 20

## 2014-04-03 MED ORDER — MIDAZOLAM HCL 2 MG/2ML IJ SOLN
INTRAMUSCULAR | Status: AC
  Filled 2014-04-03: qty 6

## 2014-04-03 MED ORDER — DIPHENHYDRAMINE HCL 12.5 MG/5ML PO ELIX: 12.5000 mg | ORAL_SOLUTION | Freq: Four times a day (QID) | ORAL | Status: DC | PRN

## 2014-04-03 MED ORDER — OXYCODONE HCL 5 MG PO TABS: 5.0000 mg | ORAL_TABLET | Freq: Once | ORAL | Status: DC | PRN

## 2014-04-03 MED ORDER — IOHEXOL 300 MG/ML  SOLN
25.0000 mL | Freq: Once | INTRAMUSCULAR | Status: AC | PRN
  Administered 2014-04-03: 25 mL

## 2014-04-03 MED ORDER — CEFAZOLIN SODIUM 1-5 GM-% IV SOLN
1.0000 g | Freq: Three times a day (TID) | INTRAVENOUS | Status: DC
  Administered 2014-04-03 – 2014-04-04 (×2): 1 g via INTRAVENOUS
  Filled 2014-04-03 (×4): qty 50

## 2014-04-03 MED ORDER — MIDAZOLAM HCL 5 MG/5ML IJ SOLN
INTRAMUSCULAR | Status: DC | PRN
  Administered 2014-04-03: 2 mg via INTRAVENOUS

## 2014-04-03 MED ORDER — SODIUM CHLORIDE 0.9 % IV SOLN
Freq: Once | INTRAVENOUS | Status: AC
  Administered 2014-04-03: 08:00:00 via INTRAVENOUS

## 2014-04-03 MED ORDER — OXYCODONE-ACETAMINOPHEN 5-325 MG PO TABS
1.0000 | ORAL_TABLET | ORAL | Status: DC | PRN
Start: 2014-04-03 — End: 2014-04-04
  Administered 2014-04-03 – 2014-04-04 (×3): 2 via ORAL
  Filled 2014-04-03 (×3): qty 2

## 2014-04-03 MED ORDER — FENTANYL CITRATE 0.05 MG/ML IJ SOLN
INTRAMUSCULAR | Status: AC
  Filled 2014-04-03: qty 5

## 2014-04-03 MED ORDER — OXYCODONE HCL 5 MG/5ML PO SOLN: 5.0000 mg | Freq: Once | ORAL | Status: DC | PRN

## 2014-04-03 MED ORDER — HYOSCYAMINE SULFATE 0.125 MG SL SUBL
0.1250 mg | SUBLINGUAL_TABLET | SUBLINGUAL | Status: DC | PRN
  Filled 2014-04-03: qty 1

## 2014-04-03 MED ORDER — HYDROMORPHONE HCL PF 1 MG/ML IJ SOLN
INTRAMUSCULAR | Status: AC
  Administered 2014-04-03: 1 mg
  Filled 2014-04-03: qty 1

## 2014-04-03 MED ORDER — DOCUSATE SODIUM 100 MG PO CAPS
100.0000 mg | ORAL_CAPSULE | Freq: Two times a day (BID) | ORAL | Status: DC
  Administered 2014-04-03 – 2014-04-04 (×2): 100 mg via ORAL
  Filled 2014-04-03 (×3): qty 1

## 2014-04-03 MED ORDER — DOCUSATE SODIUM 100 MG PO CAPS
100.0000 mg | ORAL_CAPSULE | Freq: Two times a day (BID) | ORAL | Status: DC
Start: 2014-04-03 — End: 2014-05-22

## 2014-04-03 MED ORDER — SUCCINYLCHOLINE CHLORIDE 20 MG/ML IJ SOLN
INTRAMUSCULAR | Status: DC | PRN
  Administered 2014-04-03: 160 mg via INTRAVENOUS

## 2014-04-03 MED ORDER — ONDANSETRON HCL 4 MG/2ML IJ SOLN
INTRAMUSCULAR | Status: AC
  Filled 2014-04-03: qty 2

## 2014-04-03 MED ORDER — FENTANYL CITRATE 0.05 MG/ML IJ SOLN
INTRAMUSCULAR | Status: AC
  Filled 2014-04-03: qty 6

## 2014-04-03 MED ORDER — PROMETHAZINE HCL 25 MG PO TABS: 25.0000 mg | ORAL_TABLET | Freq: Four times a day (QID) | ORAL | Status: DC | PRN

## 2014-04-03 MED ORDER — GLYCOPYRROLATE 0.2 MG/ML IJ SOLN
INTRAMUSCULAR | Status: DC | PRN
  Administered 2014-04-03: 0.6 mg via INTRAVENOUS

## 2014-04-03 MED ORDER — DEXAMETHASONE SODIUM PHOSPHATE 10 MG/ML IJ SOLN
INTRAMUSCULAR | Status: DC | PRN
  Administered 2014-04-03: 10 mg via INTRAVENOUS

## 2014-04-03 MED ORDER — LACTATED RINGERS IV SOLN
INTRAVENOUS | Status: DC | PRN
  Administered 2014-04-03 (×2): via INTRAVENOUS

## 2014-04-03 MED ORDER — PROMETHAZINE HCL 25 MG/ML IJ SOLN: 6.2500 mg | INTRAMUSCULAR | Status: DC | PRN

## 2014-04-03 MED ORDER — DIPHENHYDRAMINE HCL 50 MG/ML IJ SOLN: 12.5000 mg | Freq: Four times a day (QID) | INTRAMUSCULAR | Status: DC | PRN

## 2014-04-03 MED ORDER — FENTANYL CITRATE 0.05 MG/ML IJ SOLN
INTRAMUSCULAR | Status: AC | PRN
  Administered 2014-04-03 (×4): 25 ug via INTRAVENOUS
  Administered 2014-04-03 (×2): 50 ug via INTRAVENOUS

## 2014-04-03 MED ORDER — HYDROMORPHONE HCL PF 1 MG/ML IJ SOLN
0.2500 mg | INTRAMUSCULAR | Status: DC | PRN
  Administered 2014-04-03 (×2): 0.5 mg via INTRAVENOUS

## 2014-04-03 MED ORDER — ROCURONIUM BROMIDE 100 MG/10ML IV SOLN
INTRAVENOUS | Status: DC | PRN
  Administered 2014-04-03: 50 mg via INTRAVENOUS

## 2014-04-03 MED ORDER — LIDOCAINE HCL (CARDIAC) 20 MG/ML IV SOLN
INTRAVENOUS | Status: DC | PRN
  Administered 2014-04-03: 100 mg via INTRAVENOUS

## 2014-04-03 MED ORDER — MIDAZOLAM HCL 2 MG/2ML IJ SOLN
INTRAMUSCULAR | Status: AC | PRN
  Administered 2014-04-03 (×2): 1 mg via INTRAVENOUS
  Administered 2014-04-03 (×3): 0.5 mg via INTRAVENOUS
  Administered 2014-04-03: 1 mg via INTRAVENOUS
  Administered 2014-04-03: 0.5 mg via INTRAVENOUS

## 2014-04-03 MED ORDER — IOHEXOL 300 MG/ML  SOLN
INTRAMUSCULAR | Status: DC | PRN
  Administered 2014-04-03: 47 mL

## 2014-04-03 MED ORDER — SODIUM CHLORIDE 0.9 % IR SOLN
Status: DC | PRN
  Administered 2014-04-03: 1000 mL

## 2014-04-03 MED ORDER — BISACODYL 10 MG RE SUPP: 10.0000 mg | Freq: Every day | RECTAL | Status: DC | PRN

## 2014-04-03 MED ORDER — GLYCOPYRROLATE 0.2 MG/ML IJ SOLN
INTRAMUSCULAR | Status: AC
  Filled 2014-04-03: qty 1

## 2014-04-03 MED ORDER — ROCURONIUM BROMIDE 100 MG/10ML IV SOLN
INTRAVENOUS | Status: AC
  Filled 2014-04-03: qty 1

## 2014-04-03 MED ORDER — MIDAZOLAM HCL 2 MG/2ML IJ SOLN
INTRAMUSCULAR | Status: AC
  Filled 2014-04-03: qty 2

## 2014-04-03 MED ORDER — FENTANYL CITRATE 0.05 MG/ML IJ SOLN
INTRAMUSCULAR | Status: DC | PRN
  Administered 2014-04-03 (×2): 100 ug via INTRAVENOUS
  Administered 2014-04-03: 50 ug via INTRAVENOUS
  Administered 2014-04-03 (×2): 100 ug via INTRAVENOUS
  Administered 2014-04-03: 50 ug via INTRAVENOUS

## 2014-04-03 MED ORDER — PROPOFOL 10 MG/ML IV BOLUS
INTRAVENOUS | Status: AC
  Filled 2014-04-03: qty 20

## 2014-04-03 MED ORDER — ONDANSETRON HCL 4 MG/2ML IJ SOLN
4.0000 mg | INTRAMUSCULAR | Status: DC | PRN
  Administered 2014-04-03 – 2014-04-04 (×4): 4 mg via INTRAVENOUS
  Filled 2014-04-03 (×5): qty 2

## 2014-04-03 MED ORDER — MEPERIDINE HCL 50 MG/ML IJ SOLN: 6.2500 mg | INTRAMUSCULAR | Status: DC | PRN

## 2014-04-03 MED ORDER — PROPOFOL 10 MG/ML IV BOLUS
INTRAVENOUS | Status: DC | PRN
  Administered 2014-04-03: 200 mg via INTRAVENOUS

## 2014-04-03 MED ORDER — OXYCODONE-ACETAMINOPHEN 5-325 MG PO TABS: 1.0000 | ORAL_TABLET | ORAL | Status: DC | PRN

## 2014-04-03 MED ORDER — SODIUM CHLORIDE 0.9 % IR SOLN
Status: DC | PRN
  Administered 2014-04-03: 6000 mL

## 2014-04-03 MED ORDER — ACETAMINOPHEN 10 MG/ML IV SOLN
1000.0000 mg | Freq: Once | INTRAVENOUS | Status: DC
  Filled 2014-04-03: qty 100

## 2014-04-03 MED ORDER — LISINOPRIL 10 MG PO TABS
10.0000 mg | ORAL_TABLET | Freq: Every morning | ORAL | Status: DC
  Administered 2014-04-04: 10 mg via ORAL
  Filled 2014-04-03: qty 1

## 2014-04-03 MED ORDER — CIPROFLOXACIN IN D5W 400 MG/200ML IV SOLN
INTRAVENOUS | Status: AC
  Filled 2014-04-03: qty 200

## 2014-04-03 MED ORDER — CIPROFLOXACIN IN D5W 400 MG/200ML IV SOLN: 400.0000 mg | INTRAVENOUS | Status: DC

## 2014-04-03 MED ORDER — LIDOCAINE HCL (CARDIAC) 20 MG/ML IV SOLN
INTRAVENOUS | Status: AC
  Filled 2014-04-03: qty 5

## 2014-04-03 MED ORDER — GLYCOPYRROLATE 0.2 MG/ML IJ SOLN
INTRAMUSCULAR | Status: AC
  Filled 2014-04-03: qty 3

## 2014-04-03 MED ORDER — PANTOPRAZOLE SODIUM 40 MG PO TBEC
40.0000 mg | DELAYED_RELEASE_TABLET | Freq: Every day | ORAL | Status: DC
  Administered 2014-04-03 – 2014-04-04 (×2): 40 mg via ORAL
  Filled 2014-04-03 (×2): qty 1

## 2014-04-03 MED ORDER — POTASSIUM CHLORIDE IN NACL 20-0.45 MEQ/L-% IV SOLN
INTRAVENOUS | Status: DC
  Administered 2014-04-03 – 2014-04-04 (×2): via INTRAVENOUS
  Filled 2014-04-03 (×5): qty 1000

## 2014-04-03 MED ORDER — ACETAMINOPHEN 325 MG PO TABS: 650.0000 mg | ORAL_TABLET | ORAL | Status: DC | PRN

## 2014-04-03 MED ORDER — HYDROMORPHONE HCL PF 1 MG/ML IJ SOLN
0.5000 mg | INTRAMUSCULAR | Status: DC | PRN
  Administered 2014-04-03 – 2014-04-04 (×7): 1 mg via INTRAVENOUS
  Filled 2014-04-03 (×8): qty 1

## 2014-04-03 MED ORDER — CIPROFLOXACIN IN D5W 400 MG/200ML IV SOLN
400.0000 mg | Freq: Once | INTRAVENOUS | Status: AC
Start: 2014-04-03 — End: 2014-04-03
  Administered 2014-04-03: 400 mg via INTRAVENOUS
  Filled 2014-04-03: qty 200

## 2014-04-03 SURGICAL SUPPLY — 49 items
BAG URINE DRAINAGE (UROLOGICAL SUPPLIES) ×3 IMPLANT
BAG URO CATCHER STRL LF (DRAPE) IMPLANT
BASKET ZERO TIP NITINOL 2.4FR (BASKET) IMPLANT
BENZOIN TINCTURE PRP APPL 2/3 (GAUZE/BANDAGES/DRESSINGS) ×3 IMPLANT
BLADE SURG 15 STRL LF DISP TIS (BLADE) ×1 IMPLANT
BLADE SURG 15 STRL SS (BLADE) ×2
CARTRIDGE STONEBREAK CO2 KIDNE (ELECTROSURGICAL) IMPLANT
CATH AINSWORTH 30CC 24FR (CATHETERS) ×3 IMPLANT
CATH COUNCIL 22FR (CATHETERS) ×3 IMPLANT
CATH ROBINSON RED A/P 20FR (CATHETERS) IMPLANT
CATH URET 5FR 28IN OPEN ENDED (CATHETERS) IMPLANT
CATH URET DUAL LUMEN 6-10FR 50 (CATHETERS) ×3 IMPLANT
CATH X-FORCE N30 NEPHROSTOMY (TUBING) ×3 IMPLANT
COVER SURGICAL LIGHT HANDLE (MISCELLANEOUS) ×3 IMPLANT
DRAPE C-ARM 42X120 X-RAY (DRAPES) ×3 IMPLANT
DRAPE CAMERA CLOSED 9X96 (DRAPES) ×3 IMPLANT
DRAPE LINGEMAN PERC (DRAPES) ×3 IMPLANT
DRAPE SURG IRRIG POUCH 19X23 (DRAPES) ×3 IMPLANT
DRSG TEGADERM 8X12 (GAUZE/BANDAGES/DRESSINGS) ×3 IMPLANT
FIBER LASER FLEXIVA 1000 (UROLOGICAL SUPPLIES) IMPLANT
FIBER LASER FLEXIVA 200 (UROLOGICAL SUPPLIES) IMPLANT
FIBER LASER FLEXIVA 365 (UROLOGICAL SUPPLIES) IMPLANT
FIBER LASER FLEXIVA 550 (UROLOGICAL SUPPLIES) IMPLANT
GLOVE SURG SS PI 8.0 STRL IVOR (GLOVE) ×3 IMPLANT
GOWN STRL REUS W/TWL XL LVL3 (GOWN DISPOSABLE) ×3 IMPLANT
KIT BASIN OR (CUSTOM PROCEDURE TRAY) ×3 IMPLANT
MANIFOLD NEPTUNE II (INSTRUMENTS) ×3 IMPLANT
MASK EYE SHIELD (GAUZE/BANDAGES/DRESSINGS) IMPLANT
NS IRRIG 1000ML POUR BTL (IV SOLUTION) IMPLANT
PACK BASIC VI WITH GOWN DISP (CUSTOM PROCEDURE TRAY) ×3 IMPLANT
PACK CYSTO (CUSTOM PROCEDURE TRAY) IMPLANT
PAD ABD 7.5X8 STRL (GAUZE/BANDAGES/DRESSINGS) ×6 IMPLANT
PAD ABD 8X10 STRL (GAUZE/BANDAGES/DRESSINGS) ×3 IMPLANT
PROBE KIDNEY STONEBRKR 2.0X425 (ELECTROSURGICAL) IMPLANT
PROBE LITHOCLAST ULTRA 3.8X403 (UROLOGICAL SUPPLIES) ×3 IMPLANT
PROBE PNEUMATIC 1.0MMX570MM (UROLOGICAL SUPPLIES) ×3 IMPLANT
SET IRRIG Y TYPE TUR BLADDER L (SET/KITS/TRAYS/PACK) ×3 IMPLANT
SPONGE GAUZE 4X4 12PLY (GAUZE/BANDAGES/DRESSINGS) ×3 IMPLANT
SPONGE LAP 4X18 X RAY DECT (DISPOSABLE) ×3 IMPLANT
STONE CATCHER W/TUBE ADAPTER (UROLOGICAL SUPPLIES) ×3 IMPLANT
SUT SILK 2 0 30  PSL (SUTURE) ×2
SUT SILK 2 0 30 PSL (SUTURE) ×1 IMPLANT
SYR 20CC LL (SYRINGE) ×6 IMPLANT
SYRINGE 10CC LL (SYRINGE) ×3 IMPLANT
TOWEL OR 17X26 10 PK STRL BLUE (TOWEL DISPOSABLE) ×3 IMPLANT
TOWEL OR NON WOVEN STRL DISP B (DISPOSABLE) ×3 IMPLANT
TRAY FOLEY CATH 16FRSI W/METER (SET/KITS/TRAYS/PACK) ×3 IMPLANT
TUBING CONNECTING 10 (TUBING) ×6 IMPLANT
TUBING CONNECTING 10' (TUBING) ×3

## 2014-04-03 NOTE — Anesthesia Postprocedure Evaluation (Signed)
Anesthesia Post Note  Patient: Timothy Pratt  Procedure(s) Performed: Procedure(s) (LRB): NEPHROLITHOTOMY PERCUTANEOUS LEFT  (Left)  Anesthesia type: General  Patient location: PACU  Post pain: Pain level controlled  Post assessment: Post-op Vital signs reviewed  Last Vitals: BP 133/83  Pulse 73  Temp(Src) 36.4 C (Oral)  Resp 16  Ht 5\' 11"  (1.803 m)  Wt 331 lb 2.1 oz (150.2 kg)  BMI 46.20 kg/m2  SpO2 95%  Post vital signs: Reviewed  Level of consciousness: sedated  Complications: No apparent anesthesia complications

## 2014-04-03 NOTE — Discharge Instructions (Signed)
Percutaneous Nephrolithotomy °Kidney stones can cause a great deal of pain. They can block urine from leaving the body. And they can lead to infection. Kidney stones might pass on their own, or they can be broken up by shock waves from a special machine. But sometimes surgery is needed to get rid of kidney stones. One type of surgery is called percutaneous nephrolithotomy. "Nephro" means kidney, "litho" means stone and "tomy" means removal by surgery. "Percutaneous" means through the skin. This type of surgery needs only a small cut (incision) in the skin. °This surgery may be suggested for various reasons. They include: °· The kidney stones are 2 centimeters wide (about 3/4 inch) or bigger. They also might be oddly shaped. °· Other treatments were tried, but all or some of the kidney stones remain. °· Infection has developed. °· No other treatment can be used. °LET YOUR CAREGIVER KNOW ABOUT:  °· Any allergies. °· All medications you are taking, including: °· Herbs, eyedrops, over-the-counter medications and creams. °· Blood thinners (anticoagulants), aspirin or other drugs that could affect blood clotting. °· Use of steroids (by mouth or as creams). °· Previous problems with anesthetics, including local anesthetics. °· Possibility of pregnancy, if this applies. °· Any history of blood clots. °· Any history of bleeding or other blood problems. °· Previous surgery. °· Smoking history. °· Other health problems. °RISKS AND COMPLICATIONS  °· During surgery: °· Sometimes all the kidney stones cannot be removed through the tube. Then, the percutaneous nephrolithotomy procedure would be stopped. Open surgery would be used to remove the remaining stones. °· Short-term risks from the surgery could include: °· Excessive bleeding. °· Blood in your urine. °· Holes in the kidney. These usually heal on their own. °· Pain. °· Redness or tenderness at the incision site. °· Numbness (loss of feeling) in the area treated. Tingling  also is possible. °· A pooling of blood in the wound (hematoma). °· Infection. °· Slow healing. °· Longer-term possibilities include: °· Kidney damage. °· Damage to organs near the kidney. °· Need for a repeat surgery. °BEFORE THE PROCEDURE °· You may need to take some tests before your surgery. These might include: °· Blood tests. °· Urine tests. °· Tests to make sure your heart is working properly. °· Let your healthcare provider know if you think you might have a urinary tract infection. You will probably need to take an antibiotic to treat this before the surgery. °· Two weeks before your surgery, stop using aspirin and non-steroidal anti-inflammatory drugs (NSAIDs) for pain relief. This includes prescription drugs and over-the-counter drugs such as ibuprofen and naproxen. Also stop taking vitamin E. °· If you take blood-thinners, ask your healthcare provider when you should stop taking them. °· Do not eat or drink for about 8 hours before your surgery. °· You might be asked to shower or wash with a special antibacterial soap before the procedure. °· Arrive at least an hour before the surgery, or whenever your surgeon recommends. This will give you time to check in and fill out any needed paperwork. °PROCEDURE °· The preparation: °· You will change into a hospital gown. °· You will be given an IV. A needle will be inserted in your arm. Medication will be able to flow directly into your body through this needle. °· You might be given a sedative. This medication will help you relax. °· You may be given a general anesthetic (a drug that will put you to sleep during the surgery). Or, you may   get a local anesthetic (part of your body will be numb, but you will remain awake). °· A catheter (tube) will be put in your bladder to drain urine during and after surgery. °· The procedure: °· The surgeon will make a small incision in your lower back. °· A tube will be inserted through the incision into your kidney. °· Each  kidney stone is removed through this tube. Larger stones may first be broken up with a laser (a high-intensity light beam) or other tools. °· If a kidney stone has already left the kidney, the surgeon would use a special tool to bring it back in. Then it would be removed through the tube. °· After all the stones are taken out, a catheter will be put in. Fluid can build up around the kidney as it heals. The catheter lets this fluid drain out of the body. °· A dressing (medicine and bandage) will be put on the incision area. °· The surgery usually takes three to four hours. °AFTER THE PROCEDURE °· You will stay in a recovery area until the anesthesia has worn off. Your blood pressure and pulse will be checked often. You might be given more pain medication. °· You will be moved to a hospital room for the rest of your stay. °· The catheter that is taking urine out of your body will be taken out within 24 hours. °· The day after your surgery, you should be able to walk around. Walking helps prevent blood clots (thick clumps that can block the flow of blood). °· You may be asked to do some breathing exercises. °· You will be able to have only liquids for a day or two. °· Before you go home: °· The catheter that is draining fluid from the kidney area is usually taken out. °· You will be taught how to care for the incision. Be sure to ask how often the dressing should be changed and when it can get wet. °· You also will be told what you should and should not do while your kidney and incisions heal. For instance, you may be urged to walk to prevent blood clots. °Document Released: 09/04/2009 Document Revised: 01/30/2012 Document Reviewed: 09/04/2009 °[employer]® Patient Information ©2014 [employer]. ° °

## 2014-04-03 NOTE — Transfer of Care (Signed)
Immediate Anesthesia Transfer of Care Note  Patient: Timothy Pratt  Procedure(s) Performed: Procedure(s): NEPHROLITHOTOMY PERCUTANEOUS LEFT  (Left)  Patient Location: PACU  Anesthesia Type:General  Level of Consciousness: awake, alert , oriented and patient cooperative  Airway & Oxygen Therapy: Patient Spontanous Breathing and Patient connected to face mask oxygen  Post-op Assessment: Report given to PACU RN, Post -op Vital signs reviewed and stable and Patient moving all extremities X 4  Post vital signs: stable  Complications: No apparent anesthesia complications

## 2014-04-03 NOTE — Progress Notes (Signed)
Hgb. And Hct. Drawn by lab. 

## 2014-04-03 NOTE — Progress Notes (Signed)
Patient ID: Timothy Pratt, male   DOB: 08/23/1976, 38 y.o.   MRN: [id number] He is doing well post op with some post op pain.  His NT urine is clear and his Hgb is normal.
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
## 2014-04-03 NOTE — Sedation Documentation (Cosign Needed)
Medication dose calculated and verified for: Versed 5mg , Fentanyl 200mcg, Cipro 400mg .  45 mins.

## 2014-04-03 NOTE — Brief Op Note (Signed)
04/03/2014  1:09 PM  PATIENT:  Earlean ShawlBernard J Negash  38 y.o. male  PRE-OPERATIVE DIAGNOSIS:  LEFT RENAL STONE 1.5 x 7cm  POST-OPERATIVE DIAGNOSIS:  Left renal stone 1.5 x 15 cm  PROCEDURE:  Procedure(s): NEPHROLITHOTOMY PERCUTANEOUS LEFT  (Left) <2.0cm  Antegrade nephrostogram.   SURGEON:  Surgeon(s) and Role:    * Anner CreteJohn J Destynie Toomey, MD - Primary  PHYSICIAN ASSISTANT:   ASSISTANTS: none   ANESTHESIA:   general  EBL:  Total I/O In: 1000 [I.V.:1000] Out: 200 [Urine:100; Blood:100]  BLOOD ADMINISTERED:none  DRAINS: Urinary Catheter (Foley) 7724fr Ainsworth Nephrostomy and 6 fr Kompy catheter.   LOCAL MEDICATIONS USED:  NONE  SPECIMEN:  Source of Specimen:  left renal stone.   DISPOSITION OF SPECIMEN:  to patient  COUNTS:  YES  TOURNIQUET:  * No tourniquets in log *  DICTATION: .Other Dictation: Dictation Number [id number]  PLAN OF CARE: Admit for overnight observation  PATIENT DISPOSITION:  PACU - hemodynamically stable.   Delay start of Pharmacological VTE agent (>24hrs) due to surgical blood loss or risk of bleeding: yes

## 2014-04-03 NOTE — Interval H&P Note (Signed)
History and Physical Interval Note:  04/03/2014 11:14 AM  Timothy Pratt  has presented today for surgery, with the diagnosis of LEFT RENAL STONE  The various methods of treatment have been discussed with the patient and family. After consideration of risks, benefits and other options for treatment, the patient has consented to  Procedure(s): NEPHROLITHOTOMY PERCUTANEOUS LEFT  (Left) HOLMIUM LASER APPLICATION (Left) as a surgical intervention .  The patient's history has been reviewed, patient examined, no change in status, stable for surgery.  I have reviewed the patient's chart and labs.  Questions were answered to the patient's satisfaction.     [name]

## 2014-04-03 NOTE — Anesthesia Preprocedure Evaluation (Addendum)
Anesthesia Evaluation  Patient identified by MRN, date of birth, ID band Patient awake    Reviewed: Allergy & Precautions, H&P , NPO status , Patient's Chart, lab work & pertinent test results  Airway Mallampati: II TM Distance: >3 FB   Mouth opening: Limited Mouth Opening  Dental  (+) Dental Advisory Given   Pulmonary sleep apnea , former smoker,  breath sounds clear to auscultation        Cardiovascular hypertension, Pt. on medications Rhythm:Regular Rate:Normal     Neuro/Psych negative neurological ROS  negative psych ROS   GI/Hepatic Neg liver ROS, GERD-  Medicated,  Endo/Other  Morbid obesity  Renal/GU Renal disease     Musculoskeletal negative musculoskeletal ROS (+)   Abdominal   Peds  Hematology negative hematology ROS (+)   Anesthesia Other Findings   Reproductive/Obstetrics negative OB ROS                          Anesthesia Physical Anesthesia Plan  ASA: III  Anesthesia Plan: General   Post-op Pain Management:    Induction: Intravenous  Airway Management Planned: Oral ETT  Additional Equipment:   Intra-op Plan:   Post-operative Plan: Extubation in OR  Informed Consent: I have reviewed the patients History and Physical, chart, labs and discussed the procedure including the risks, benefits and alternatives for the proposed anesthesia with the patient or authorized representative who has indicated his/her understanding and acceptance.   Dental advisory given  Plan Discussed with: CRNA  Anesthesia Plan Comments: (Discussed risks of anesthesia. Pt remains sedated from percutaneous nephrolithotomy placement in IR. )       Anesthesia Quick Evaluation

## 2014-04-03 NOTE — Procedures (Signed)
Interventional Radiology Procedure Note  Procedure: LEFT perc neph access with placement of a 56F safety catheter through the ureter and into the bladder.  Complications: None Recommendations: - Had to access via intercostal route, pt may have pain from peri-ostial irritation - To OR for PCNL  Signed,  Sterling BigHeath K. Kairy Folsom, MD [facility]

## 2014-04-04 ENCOUNTER — Observation Stay (HOSPITAL_COMMUNITY): Payer: 59

## 2014-04-04 ENCOUNTER — Encounter (HOSPITAL_COMMUNITY): Payer: Self-pay | Admitting: Urology

## 2014-04-04 LAB — HEMOGLOBIN AND HEMATOCRIT, BLOOD
HEMATOCRIT: 42.9 % (ref 39.0–52.0)
Hemoglobin: 14.6 g/dL (ref 13.0–17.0)

## 2014-04-04 IMAGING — CR DG ABDOMEN 1V
2 series · 2 of 2 positions shown · non-contrast
Comparison: Prior CT urogram [DATE]; percutaneous nephrostomy
images [id number] [KR]

CLINICAL DATA: Post left percutaneous nephrolithotomy

EXAM:
ABDOMEN - 1 VIEW

[t abdomen supine (1 of 2)]
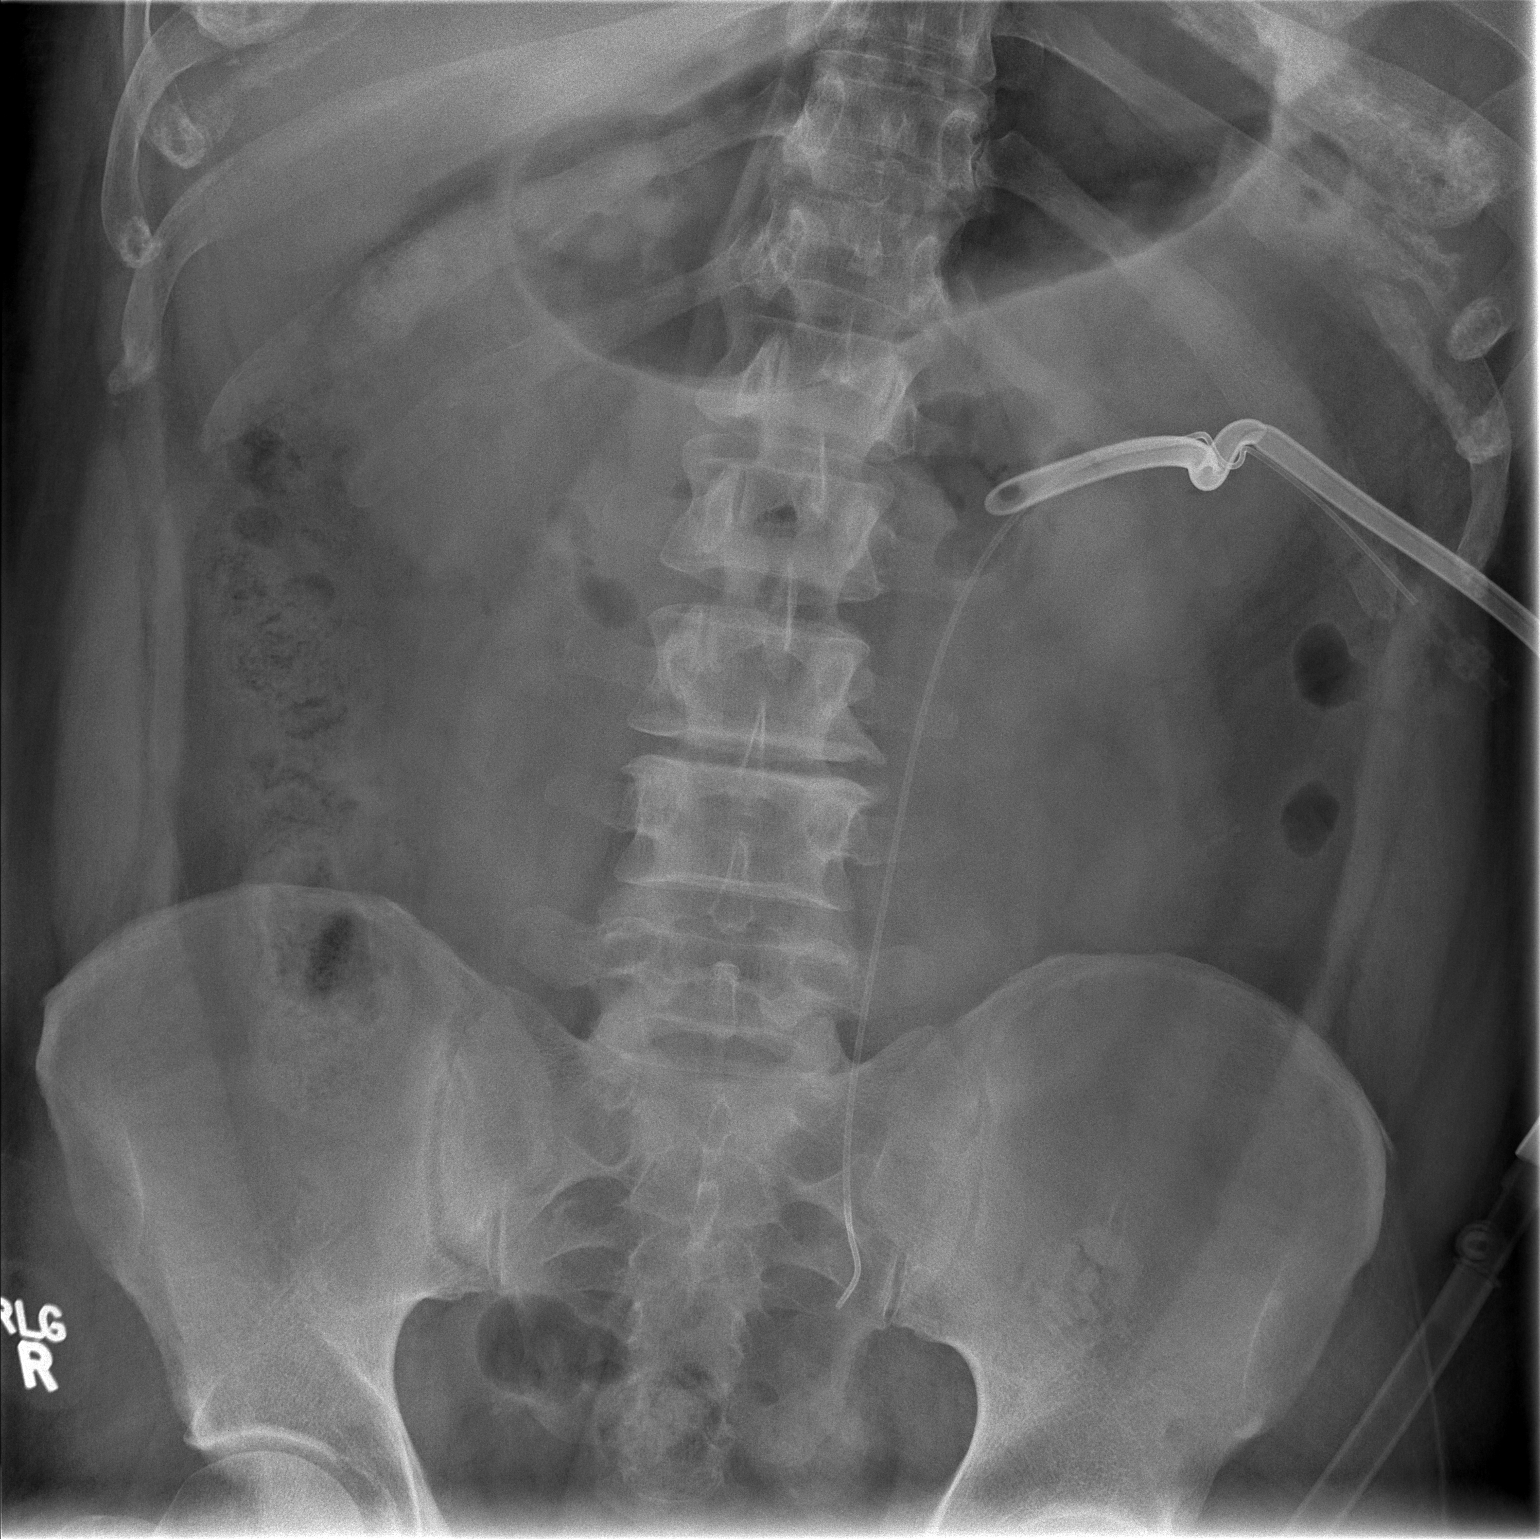

[t abdomen supine (2 of 2)]
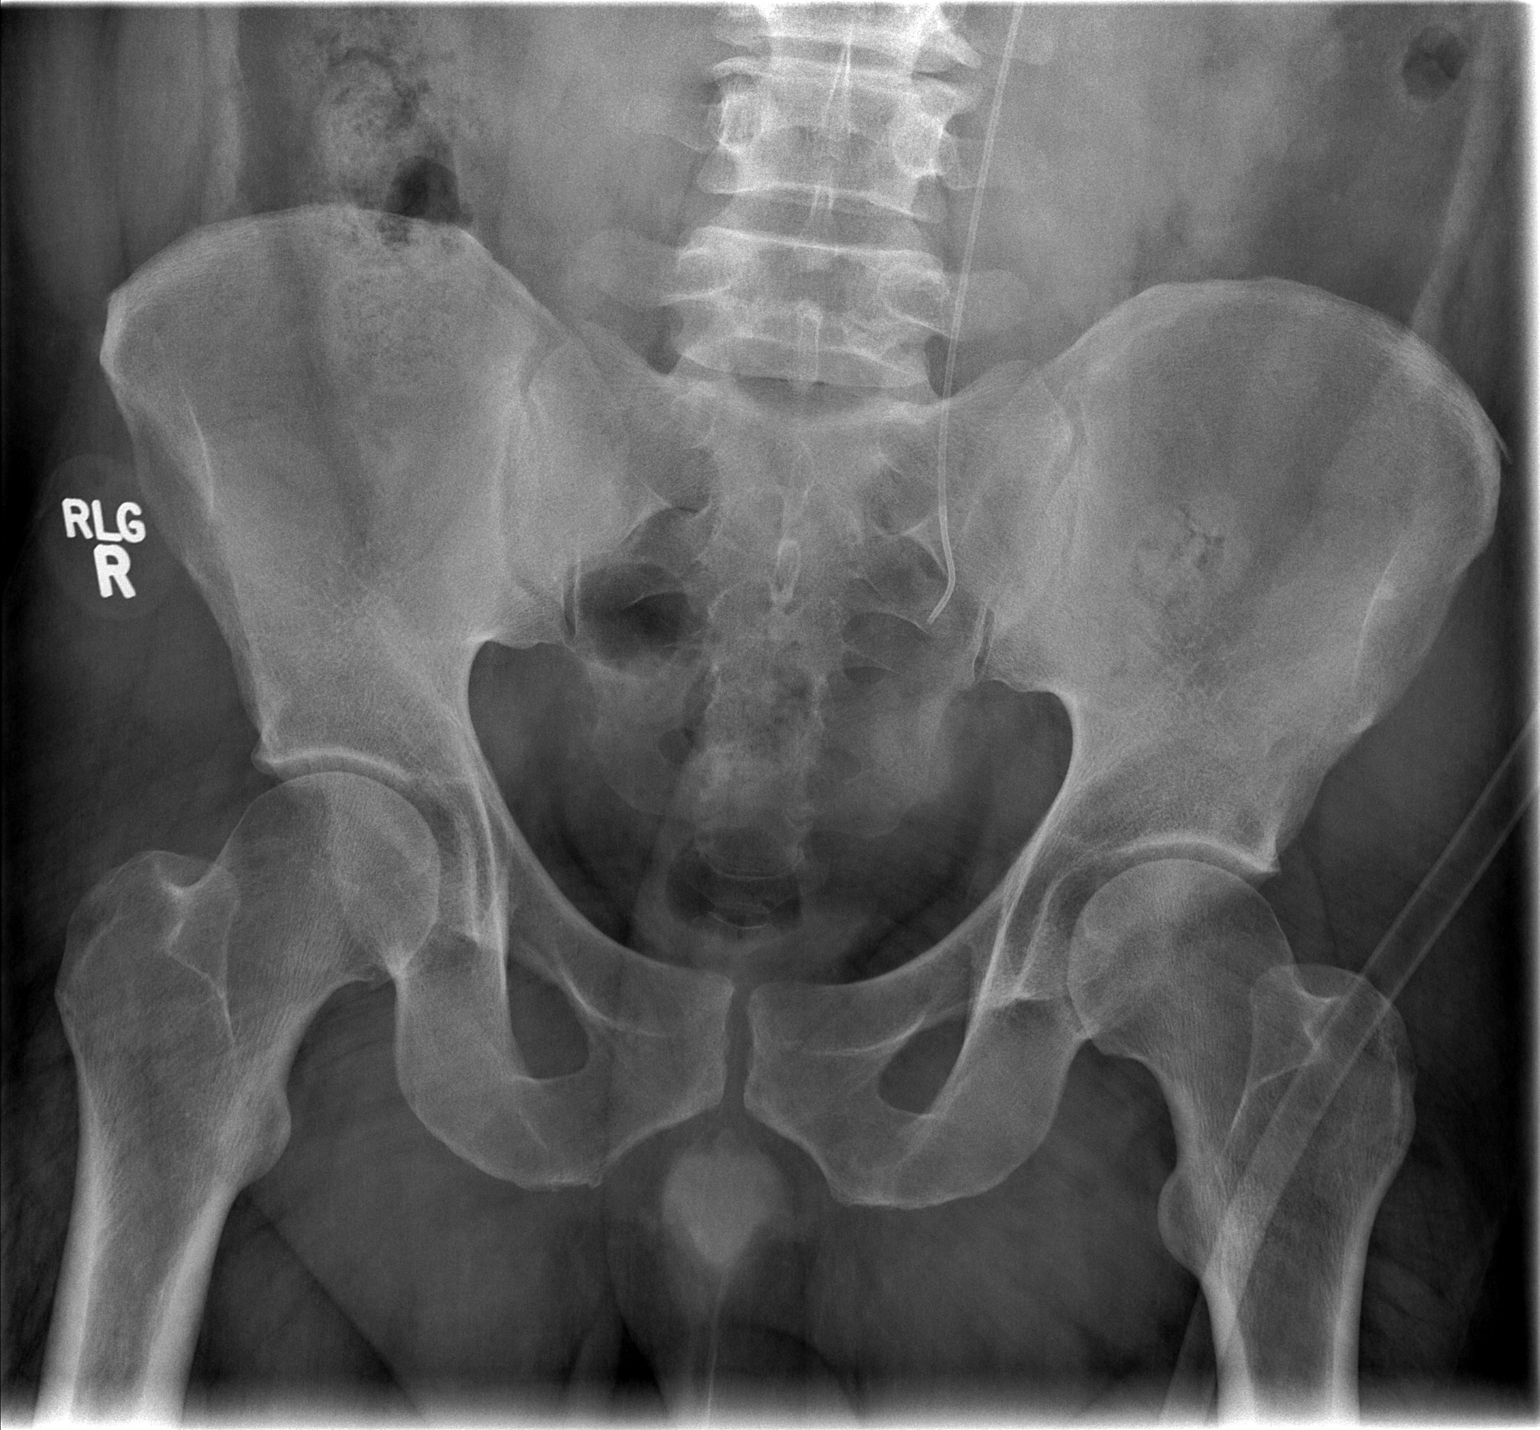

[2 of 2 positions shown; findings below may reference images not displayed]

FINDINGS: A 5 French safety catheter is present on the left and extends into
the region of the distal ureter. There is a 24 French Foley catheter
projecting over the renal pelvis. No large free air. The fat plane
is well preserved around the left kidney suggesting that there is no
significant perinephric hematoma. No residual nephrolithiasis
identified on either side. No acute osseous abnormality.
IMPRESSION: Status post left percutaneous nephrolithotomy without evidence of
retained stone fragments by conventional x-ray.

Twenty-four French Foley catheter projects over the left renal
pelvis, and a 5 French safety catheter is noted as far distally as
the distal ureter.

## 2014-04-04 MED ORDER — CIPROFLOXACIN HCL 500 MG PO TABS: 500.0000 mg | ORAL_TABLET | Freq: Two times a day (BID) | ORAL | Status: DC

## 2014-04-04 NOTE — Op Note (Signed)
NAMAlgie Coffer:  Pehrson, Callahan           ACCOUNT NO.:  [id number]  MEDICAL RECORD NO.:  [id number]  LOCATION:  1421                         [facility]  PHYSICIAN:  Excell SeltzerJohn J. Annabell HowellsWrenn, M.D.    DATE OF BIRTH:  1976/01/20  DATE OF PROCEDURE:  04/03/2014 DATE OF DISCHARGE:                              OPERATIVE REPORT   PROCEDURE: 1. Left percutaneous nephrolithotomy for 1.5 cm stone. 2. Antegrade nephrostogram.  PREOPERATIVE DIAGNOSIS:  1.2 x 7 cm left renal and proximal ureteral stone.  POSTOPERATIVE DIAGNOSIS:  1.2 x 1.2 cm left renal pelvic stone.  SURGEON:  Excell SeltzerJohn J. Annabell HowellsWrenn, M.D.  ANESTHESIA:  General.  SPECIMENS:  Stone fragments.  DRAINS:  Foley catheter, 24 Ainsworth nephrostomy tube and 6-French Kumpe catheter.  BLOOD LOSS:  50 mL.  COMPLICATIONS:  None.  INDICATIONS:  Mr. Milana NaMcSweeney is a 38 year old white male with a history of stones who recently presented with hematuria and pain and a CT scan was obtained, which demonstrated a 1.2 x 7 cm stone in the left proximal ureter.  He had, had prior ureteroscopic procedures and did not like the stent and I thought because of the apparent size of the stone that the most appropriate option was percutaneous nephrolithotomy and he was fully consented for the procedure.  FINDINGS AND PROCEDURE:  Earlier in the day, he was taken to Interventional Radiology where he was given Cipro and a left percutaneous nephrostomy tube was placed through a supracostal approach above the 12th rib through a midpole calyx.  I reviewed the nephrostogram and I could only see about a 1.2 cm stone in the renal pelvis, but no more significant stones in the proximal ureter.  He was taken to the operating room where a general anesthetic was induced on the holding room stretcher.  His genitalia was prepped with Betadine solution and a Foley catheter was inserted.  He was then rolled prone on chest rolls with all pressure points padded appropriately  and fitted with PAS hose.  The nephrostomy site was then prepped with Betadine solution and draped in usual sterile fashion.  Then under fluoroscopic guidance, a guidewire was placed through the nephrostomy catheter to the bladder. The nephrostomy catheter was removed and a double-lumen catheter was placed over the wire into the distal ureter and a second wire was passed to the bladder.  Amplatz Super Stiff  wires were used.  The double-lumen tube was removed.  The safety wire was secured in the plastic bail and the nephrostomy site incision was enlarged with a scalpel.  The Trackmaster balloon was then placed over the working wire and advanced until the tip was within the renal pelvis.  The patient was rolled to his left to allow an easier angle of approach and a more oblique fluoroscopic view to better visualize the structures.  Once the balloon was in position, it was inflated to 18 atmospheres with excellent expansion and the nephrostomy sheath was then advanced over the wire into the renal pelvis.  The balloon was deflated and removed. There was minimal bleeding.  The rigid nephroscope was then assembled and inserted and some clots were removed with a two-prong grasper.  The stone was eventually identified in the renal  pelvis.  It was engaged with the percutaneous lithoclast and fragmented pneumatically and with ultrasonic energy. Once the stone was adequately fragmented, the fragments were removed with a two-prong grasper.  At this point, a flexible nephroscope was used to inspect the proximal ureter for approximately 7 cm.  No additional significant stone fragments were identified, which was not expected because of the CT findings.  The remaining upper and lower pole calices were inspected with a flexible nephroscope and no additional stones were seen and through the flexible nephroscope, contrast was injected and the ureter was inspected and no filling defects  suggestive of distal stones were identified.  At this point, the rigid scope was replaced and the ultrasonic lithotrite was used to vacuum out several small fragments until no residual fragments remained.  A 24-French Ainsworth catheter was then advanced over the working wire through the sheath into the renal pelvis.  The sheath was backed out partially and the balloon was filled with 3 mL of sterile water.  A fresh short Kumpe catheter was then advanced over the safety wire to the distal ureter and the safety wire was removed.  The sheath was backed out from around the Foley, which was secured to the skin with a 2-0 silk suture.  Pressure was applied and while there was some bleeding during sheath removal, once the sheath was out, there was no further visible bleeding.  The sheath was then cut away from the nephrostomy tube and the working wire was removed as well.  Additional contrast was instilled, which revealed the tube in good position with no significant filling defects or extravasation from the collecting system around the tube.  There were some air bubbles in the renal pelvis.  At this point, the nephrostomy tube was placed to a drainage bag.  The safety catheter was capped.  A dressing of 4x4s and ABDs was applied and secured with an adhesive bandage.  The patient was then rolled back supine on the recovery room stretcher, his anesthetic was reversed, and he was moved to recovery in stable condition.  There were no complications, but in light of the findings at surgery versus the findings at CT scan, I went back and looked at the CT scan postoperatively and it appeared that the length of the stone was exaggerated due to a very well-synchronized breath during the scanning that caused the stone to register on 12 separate slices giving the appearance of a 7-cm stone.     Excell SeltzerJohn J. Annabell HowellsWrenn, M.D.     JJW/MEDQ  D:  04/03/2014  T:  04/04/2014  Job:  [id number]

## 2014-04-04 NOTE — Progress Notes (Signed)
Patient ID: Timothy Pratt, male   DOB: 01/11/1976, 38 y.o.   MRN: [id number] Physician Discharge Summary  Patient ID: Timothy RummageBernard J Pratt MRN: [id number] DOB/AGE: 38/02/1976 38 y.o.  Admit date: 04/03/2014 Discharge date: 04/04/2014  Admission Diagnoses:  Renal stone  Discharge Diagnoses:  Principal Problem:   Renal stone   Past Medical History  Diagnosis Date  . Hypertension   . Sleep apnea   . GERD (gastroesophageal reflux disease)   . Kidney calculi     Surgeries: Procedure(s): NEPHROLITHOTOMY PERCUTANEOUS LEFT  on 04/03/2014   Consultants (if any):    Discharged Condition: Improved  Hospital Course: Timothy Pratt is an 38 y.o. male who was admitted 04/03/2014 with a diagnosis of Renal stone and went to the operating room on 04/03/2014 and underwent the above named procedures.  He has done well post left PCNL and his urine is clearing today.  He has a stable normal Hgb.   He has some post procedure pain.   He will go for a KUB this am and if it is clear, he will have his tube removed and be discharged home.    He was given perioperative antibiotics:      Anti-infectives   Start     Dose/Rate Route Frequency Ordered Stop   04/04/14 0000  ciprofloxacin (CIPRO) 500 MG tablet     500 mg Oral 2 times daily 04/04/14 0628     04/03/14 2000  ceFAZolin (ANCEF) IVPB 1 g/50 mL premix     1 g 100 mL/hr over 30 Minutes Intravenous 3 times per day 04/03/14 1508     04/03/14 0739  ciprofloxacin (CIPRO) IVPB 400 mg  Status:  Discontinued     400 mg 200 mL/hr over 60 Minutes Intravenous 60 min pre-op 04/03/14 0739 04/03/14 1453   04/03/14 0600  ceFAZolin (ANCEF) 3 g in dextrose 5 % 50 mL IVPB  Status:  Discontinued     3 g 160 mL/hr over 30 Minutes Intravenous 60 min pre-op 04/02/14 1510 04/03/14 1453    .  He was given sequential compression devices for DVT prophylaxis.  He benefited maximally from the hospital stay and there were no complications.    Recent vital signs:   Filed Vitals:   04/04/14 0450  BP: 130/76  Pulse: 75  Temp: 98.3 F (36.8 C)  Resp: 16    Recent laboratory studies:  Lab Results  Component Value Date   HGB 14.6 04/04/2014   HGB 14.3 04/03/2014   HGB 15.5 03/31/2014   Lab Results  Component Value Date   WBC 10.1 03/31/2014   PLT 207 03/31/2014   Lab Results  Component Value Date   INR 0.91 04/03/2014   Lab Results  Component Value Date   NA 141 03/31/2014   K 4.5 03/31/2014   CL 101 03/31/2014   CO2 30 03/31/2014   BUN 10 03/31/2014   CREATININE 0.92 03/31/2014   GLUCOSE 84 03/31/2014    Discharge Medications:     Medication List         ciprofloxacin 500 MG tablet  Commonly known as:  CIPRO  Take 1 tablet (500 mg total) by mouth 2 (two) times daily.     docusate sodium 100 MG capsule  Commonly known as:  COLACE  Take 1 capsule (100 mg total) by mouth 2 (two) times daily.     esomeprazole 20 MG capsule  Commonly known as:  NEXIUM  Take 20 mg by mouth daily.  lisinopril 10 MG tablet  Commonly known as:  PRINIVIL,ZESTRIL  Take 10 mg by mouth every morning.     oxyCODONE-acetaminophen 5-325 MG per tablet  Commonly known as:  ROXICET  Take 1 tablet by mouth every 4 (four) hours as needed for moderate pain or severe pain.     promethazine 25 MG tablet  Commonly known as:  PHENERGAN  Take 1 tablet (25 mg total) by mouth every 6 (six) hours as needed for nausea or vomiting.        Diagnostic Studies: Dg Chest 2 View  03/31/2014   CLINICAL DATA:  Preop for left lithotripsy  EXAM: CHEST  2 VIEW  COMPARISON:  11/09/2008  FINDINGS: Cardiomediastinal silhouette is stable. No acute infiltrate or pleural effusion. No pulmonary edema. Bony thorax is unremarkable.  IMPRESSION: No active cardiopulmonary disease.   Electronically Signed   By: Natasha Mead M.D.   On: 03/31/2014 15:54   Dg C-arm 1-60 Min-no Report  04/03/2014   CLINICAL DATA: intra op   C-ARM 1-60 MINUTES  Fluoroscopy was utilized by the requesting  physician.  No radiographic  interpretation.    Ir [place] Abts Cath Perc Left  04/03/2014   CLINICAL DATA:  Renal stones, access for left percutaneous nephrolithotomy.  EXAM: [facility] CATH PERC*L*  Date: 04/03/2014  PROCEDURE: 1. Percutaneous puncture of the renal collecting system under fluoroscopic guidance 2. Placement of a percutaneous nephrostomy tube Operating Physician:  Sterling Big, MD  COMPARISON:  CT of the abdomen and pelvis - 03/14/2014  ANESTHESIA/SEDATION: Moderate (conscious) sedation was used. Five mg Versed, 200 mcg Fentanyl were administered intravenously. The patient's vital signs were monitored continuously by radiology nursing throughout the procedure. 400 mg Cipro was administered IV at an appropriate time prior to skin puncture.  Sedation Time: 45 minutes  FLUOROSCOPY TIME:  15 minutes 18 seconds  CONTRAST:  30 mL instilled into the collecting system.  TECHNIQUE: Informed written consent was obtained from the patient after a discussion of the risks, benefits, and alternatives to treatment. The left flank region was prepped with Betadine in a sterile fashion, and a sterile drape was applied covering the operative field. A sterile gown and sterile gloves were used for the procedure. A timeout was performed prior to the initiation of the procedure.  A pre procedural spot fluoroscopic image was obtained of the upper abdomen. Ultrasound scanning performed of the kidney was negative for significant hydronephrosis. As such, the stone within left renal pelvis was targeted fluoroscopically with a 22 gauge Chiba needle. Return of urine confirmed access within the renal collecting system. A hand injection of contrast material identified in the renal collecting system anatomy. A small amount of air was injected into the collecting system to help delineate a posterior calyx. A posterior interpolar calyx was targeted with a 22 gauge Chiba needle. Access to the calyx was confirmed with  advancement of a Nitrex wire into the collecting system. An Accustick set was utilized to dilate the tract and was subsequently exchanged for a Kumpe catheter over a Bentson wire. The Kumpe catheter was advanced down the ureter and into the urinary bladder. Postprocedural spot radiographs were obtained in various obliquities and the catheter was sutured to the skin. The catheter was capped and a dressing was placed. The patient tolerated the procedure well without immediate postprocedural complication.  COMPLICATIONS: None immediate  IMPRESSION: Successful fluoroscopic guided left percutaneous nephrostomy with placement of a 5 French Kumpe catheter to the level of the urinary  bladder to be utilized during impending nephrolithotomy procedure.  Signed,  Sterling BigHeath K. McCullough, MD  [facility]  [facility]   Electronically Signed   By: Malachy MoanHeath  McCullough M.D.   On: 04/03/2014 17:41    Disposition: 01-Home or Self Care    Follow-up Information   Follow up with Anner CreteWRENN,Wrenly Lauritsen J, MD. (as scheduled.)    Specialty:  Urology   Contact information:   [address] [place] [id number] [phone]        Signed: Anner CreteJohn J Avien Taha 04/04/2014, 6:28 AM

## 2014-04-04 NOTE — Discharge Summary (Signed)
  KUB shows no residual stones.  Tube removed and patient discharged.    See today's progress note for the rest of the discharge summary.

## 2014-05-22 ENCOUNTER — Ambulatory Visit (INDEPENDENT_AMBULATORY_CARE_PROVIDER_SITE_OTHER): Payer: 59 | Admitting: Family Medicine

## 2014-05-22 ENCOUNTER — Encounter: Payer: Self-pay | Admitting: Family Medicine

## 2014-05-22 VITALS — BP 138/92 | HR 91 | Temp 98.7°F | Ht 71.0 in | Wt 325.5 lb

## 2014-05-22 DIAGNOSIS — E669 Obesity, unspecified: Secondary | ICD-10-CM

## 2014-05-22 DIAGNOSIS — M25562 Pain in left knee: Secondary | ICD-10-CM

## 2014-05-22 DIAGNOSIS — E785 Hyperlipidemia, unspecified: Secondary | ICD-10-CM

## 2014-05-22 DIAGNOSIS — I10 Essential (primary) hypertension: Secondary | ICD-10-CM

## 2014-05-22 DIAGNOSIS — M25569 Pain in unspecified knee: Secondary | ICD-10-CM

## 2014-05-22 DIAGNOSIS — K219 Gastro-esophageal reflux disease without esophagitis: Secondary | ICD-10-CM

## 2014-05-22 MED ORDER — LISINOPRIL 10 MG PO TABS: 10.0000 mg | ORAL_TABLET | Freq: Every morning | ORAL | Status: DC

## 2014-05-22 NOTE — Progress Notes (Signed)
No chief complaint on file.   HPI:  Follow up:  Hematuria: -seen by his urologist and had L ureter stone and underwent nephrolithotomy on 5/14  -reports: doing well, recovering well -denies:   L knee pain: -only hurts when goes to stand up -started about 1-2 months ago -clicks sometimes -denies: swelling, redness, giving away  GERD: -reports: good as long as takes nexium -denies: heartburn, swallowing issues  HLD/Obesity/HTN: -diet and exercise: no regular exercise; cutting back on soft drinks Taking lisinopril Denies: CP, SOB, palpitation  ROS: See pertinent positives and negatives per HPI.  Past Medical History  Diagnosis Date  . Hypertension   . Sleep apnea   . GERD (gastroesophageal reflux disease)   . Kidney calculi     Past Surgical History  Procedure Laterality Date  . 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 Lithotripsy  12 years ago  . Nephrolithotomy Left 04/03/2014    Procedure: NEPHROLITHOTOMY PERCUTANEOUS LEFT ;  Surgeon: Anner CreteJohn J Wrenn, MD;  Location: [facility];  Service: Urology;  Laterality: Left;    Family History  Problem Relation Age of Onset  . Hypertension Mother   . Hypertension Father   . Heart disease Father   . COPD Father   . Hypertension Sister   . Kidney disease Brother   . Kidney disease Daughter     History   Social History  . Marital Status: Married    Spouse Name: N/A    Number of Children: N/A  . Years of Education: N/A   Social History Main Topics  . Smoking status: Former Smoker -- 1.00 packs/day for 10 years    Quit date: 09/21/2013  . Smokeless tobacco: Never Used     Comment: smoked for 11 years, pack per day  . Alcohol Use: Yes     Comment: social  . Drug Use: No  . Sexual Activity: None   Other Topics Concern  . 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 , Disp: , Rfl: ;  lisinopril (PRINIVIL,ZESTRIL) 10 MG tablet, Take 1 tablet (10 mg total) by mouth every morning., Disp: 90 tablet, Rfl: 3  EXAM:  Filed Vitals:   05/22/14 1507  BP: 138/92  Pulse: 91  Temp: 98.7 F (37.1 C)    Body mass index is 45.42 kg/(m^2).  GENERAL: vitals reviewed and listed above, alert, oriented, appears well hydrated and in no acute distress  HEENT: atraumatic, conjunttiva clear, no obvious abnormalities on inspection of external nose and ears  NECK: no obvious masses on inspection  LUNGS: clear to auscultation bilaterally, no wheezes, rales or rhonchi, good air movement  CV: HRRR, no peripheral edema  MS: moves all extremities without noticeable abnormality, normal inspection of knees -normal gait, TTP pat tendon L, pat crepitus and + 1 leg squat, neg lachman, ant/post drawer and remainder of knee and leg exam bilat  PSYCH: pleasant and cooperative, no obvious depression or anxiety  ASSESSMENT AND PLAN:  Discussed the following assessment and plan:  Lateral knee pain, left - Plan: DG Knee Complete 4 Views Left  Hyperlipemia  Obesity, unspecified  Gastroesophageal reflux disease, esophagitis presence not specified  Essential hypertension, benign - Plan: lisinopril (PRINIVIL,ZESTRIL) 10 MG tablet   -Patient advised to return or notify a doctor immediately if symptoms worsen or persist or new concerns arise.  Patient Instructions  We recommend the following healthy lifestyle measures: - eat a healthy diet consisting of lots of vegetables, fruits, beans, nuts, seeds, healthy meats such as white chicken and fish and whole grains.  - avoid fried foods, fast food, processed foods, sodas, red meet and other fattening foods.  - get a least 150 minutes of aerobic exercise per week.   Get xrays of  knees   Do the home exercise program provided  Follow up in 3-4 months for morning appointment and we will plan to do fasting labs that day     Zakariah Urwin R.

## 2014-05-22 NOTE — Patient Instructions (Signed)
We recommend the following healthy lifestyle measures: - eat a healthy diet consisting of lots of vegetables, fruits, beans, nuts, seeds, healthy meats such as white chicken and fish and whole grains.  - avoid fried foods, fast food, processed foods, sodas, red meet and other fattening foods.  - get a least 150 minutes of aerobic exercise per week.   Get xrays of knees   Do the home exercise program provided  Follow up in 3-4 months for morning appointment and we will plan to do fasting labs that day

## 2014-05-22 NOTE — Progress Notes (Signed)
Pre visit review using our clinic review tool, if applicable. No additional management support is needed unless otherwise documented below in the visit note. 

## 2014-09-04 ENCOUNTER — Encounter: Payer: 59 | Admitting: Family Medicine

## 2014-09-19 ENCOUNTER — Encounter: Payer: Self-pay | Admitting: Family Medicine

## 2014-09-19 ENCOUNTER — Ambulatory Visit (INDEPENDENT_AMBULATORY_CARE_PROVIDER_SITE_OTHER): Payer: 59 | Admitting: Family Medicine

## 2014-09-19 VITALS — BP 126/84 | HR 74 | Temp 98.1°F | Ht 69.34 in | Wt 339.8 lb

## 2014-09-19 DIAGNOSIS — Z23 Encounter for immunization: Secondary | ICD-10-CM

## 2014-09-19 DIAGNOSIS — I10 Essential (primary) hypertension: Secondary | ICD-10-CM

## 2014-09-19 DIAGNOSIS — Z0000 Encounter for general adult medical examination without abnormal findings: Secondary | ICD-10-CM

## 2014-09-19 DIAGNOSIS — M25562 Pain in left knee: Secondary | ICD-10-CM

## 2014-09-19 DIAGNOSIS — E669 Obesity, unspecified: Secondary | ICD-10-CM

## 2014-09-19 DIAGNOSIS — E785 Hyperlipidemia, unspecified: Secondary | ICD-10-CM

## 2014-09-19 DIAGNOSIS — K219 Gastro-esophageal reflux disease without esophagitis: Secondary | ICD-10-CM

## 2014-09-19 LAB — BASIC METABOLIC PANEL
BUN: 8 mg/dL (ref 6–23)
CO2: 29 mEq/L (ref 19–32)
Calcium: 8.9 mg/dL (ref 8.4–10.5)
Chloride: 102 mEq/L (ref 96–112)
Creatinine, Ser: 0.9 mg/dL (ref 0.4–1.5)
GFR: 95.09 mL/min (ref >60.00)
GLUCOSE: 106 mg/dL — AB (ref 70–99)
POTASSIUM: 4.1 meq/L (ref 3.5–5.1)
Sodium: 139 mEq/L (ref 135–145)

## 2014-09-19 LAB — LIPID PANEL
CHOLESTEROL: 231 mg/dL — AB (ref 0–200)
HDL: 50.8 mg/dL (ref >39.00)
LDL CALC: 150 mg/dL — AB (ref 0–99)
NonHDL: 180.2
Total CHOL/HDL Ratio: 5
Triglycerides: 150 mg/dL — ABNORMAL HIGH (ref 0.0–149.0)
VLDL: 30 mg/dL (ref 0.0–40.0)

## 2014-09-19 NOTE — Progress Notes (Signed)
Cc: CPE and follow up mmp  HPI:  Here for CPE:  -Concerns and/or follow up today:   Hematuria:  -seen by his urologist and had L ureter stone and underwent nephrolithotomy on 5/14  -reports: doing well, recovering well  -denies: hematuria, pain, recurrence  L knee pain:  -only hurts when goes to stand up  -started 03/2014,continues to bother him -clicks sometimes  -denies: swelling, redness, giving away  -going to see murphy wainer, did not do exercises, xrays not done  GERD:  -reports: good as long as takes nexium  -denies: heartburn, swallowing issues   HLD/Obesity/HTN:  -diet and exercise: no regular exercise; cutting back on soft drinks  Taking lisinopril  Denies: CP, SOB, palpitation  -Diet: poor diet  -Exercise: no regular exercise  -Diabetes and Dyslipidemia Screening:FASTING for labs today  -Hx of HTN: treated  -Vaccines: flu vaccine today  -sexual activity: yes, male partner, no new partners  -wants STI testing, Hep C screening (if born [id number]): no  -FH colon or prstate ca: see FH Last colon cancer screening: n/a Last prostate ca screening: n/a  -Alcohol, Tobacco, drug use: see social history  Review of Systems - no fevers, unintentional weight loss, vision loss, hearing loss, chest pain, sob, hemoptysis, melena, hematochezia, hematuria, genital discharge, changing or concerning skin lesions, bleeding, bruising, loc, thoughts of self harm or SI  Past Medical History  Diagnosis Date  . Hypertension   . Sleep apnea   . GERD (gastroesophageal reflux disease)   . Kidney calculi     Past Surgical History  Procedure Laterality Date  . 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 Lithotripsy  12 years ago  . Nephrolithotomy Left 04/03/2014    Procedure: NEPHROLITHOTOMY PERCUTANEOUS LEFT ;  Surgeon: Anner CreteJohn J Wrenn, MD;  Location: [facility];  Service: Urology;  Laterality: Left;    Family History  Problem Relation Age of Onset  .  Hypertension Mother   . Hypertension Father   . Heart disease Father   . COPD Father   . Hypertension Sister   . Kidney disease Brother   . Kidney disease Daughter     History   Social History  . Marital Status: Married    Spouse Name: N/A    Number of Children: N/A  . Years of Education: N/A   Social History Main Topics  . Smoking status: Former Smoker -- 1.00 packs/day for 10 years    Quit date: 09/21/2013  . Smokeless tobacco: Never Used     Comment: smoked for 11 years, pack per day  . Alcohol Use: Yes     Comment: social  . Drug Use: No  . Sexual Activity: None   Other Topics Concern  . 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 , Disp: , Rfl: ;  lisinopril (PRINIVIL,ZESTRIL) 10 MG tablet, Take 1 tablet (10 mg total) by mouth every morning., Disp: 90 tablet, Rfl: 3  EXAM:  Filed Vitals:   09/19/14 0819  BP: 126/84  Pulse: 74  Temp: 98.1 F (36.7 C)  TempSrc: Oral  Height: 5'  9.34" (1.761 m)  Weight: 339 lb 12.8 oz (154.132 kg)    Estimated body mass index is 49.7 kg/(m^2) as calculated from the following:   Height as of this encounter: 5' 9.34" (1.761 m).   Weight as of this encounter: 339 lb 12.8 oz (154.132 kg).  GENERAL: vitals reviewed and listed below, alert, oriented, appears well hydrated and in no acute distress  HEENT: head atraumatic, PERRLA, normal appearance of eyes, ears, nose and mouth. moist mucus membranes.  NECK: supple, no masses or lymphadenopathy  LUNGS: clear to auscultation bilaterally, no rales, rhonchi or wheeze  CV: HRRR, no peripheral edema or cyanosis, normal pedal pulses  GU: declined  RECTAL: refused  SKIN: no rash or abnormal  lesions  MS: normal gait, moves all extremities normally  NEURO: CN II-XII grossly intact, normal muscle strength and sensation to light touch on extremities  PSYCH: normal affect, pleasant and cooperative  ASSESSMENT AND PLAN:  Discussed the following assessment and plan:  1. Visit for preventive health examination -Discussed and advised all [place] preventive services health task force level A and B recommendations for age, sex and risks. -Advised at least 150 minutes of exercise per week and a healthy diet low in saturated fats and sweets and consisting of fresh fruits and vegetables, lean meats such as fish and white chicken and whole grains. -FASTING labs, studies and vaccines per orders this encounter - flu vaccine today  2. Essential hypertension, benign - Basic metabolic panel -continue current medication -stressed importance of healthy diet and regular exercise  3. Hyperlipemia/OBESITY - Lipid Panel -stressed importance of healthy diet and regular exercise  4. Gastroesophageal reflux disease, esophagitis presence not specified -stable - continue nexium  5. Lateral knee pain, left -advised HEP, eval with ortho  Patient advised to return to clinic immediately if symptoms worsen or persist or new concerns.  Patient Instructions  BEFORE YOU LEAVE: -flu shot -labs -follow up in 3 months  Call for appointment with the orthopedic doctor about your knee  We recommend the following healthy lifestyle measures: - eat a healthy diet consisting of lots of vegetables, fruits, beans, nuts, seeds, healthy meats such as white chicken and fish and whole grains.  - avoid fried foods, fast food, processed foods, sodas, red meet and other fattening foods.  - get a least 150 minutes of aerobic exercise per week.       Return in about 3 months (around 12/20/2014) for follow up.   Kriste BasqueKIM, HANNAH R.

## 2014-09-19 NOTE — Patient Instructions (Addendum)
BEFORE YOU LEAVE: -flu shot -labs -follow up in 3 months  Call for appointment with the orthopedic doctor about your knee  We recommend the following healthy lifestyle measures: - eat a healthy diet consisting of lots of vegetables, fruits, beans, nuts, seeds, healthy meats such as white chicken and fish and whole grains.  - avoid fried foods, fast food, processed foods, sodas, red meet and other fattening foods.  - get a least 150 minutes of aerobic exercise per week.

## 2014-09-19 NOTE — Progress Notes (Signed)
Pre visit review using our clinic review tool, if applicable. No additional management support is needed unless otherwise documented below in the visit note. 

## 2014-09-19 NOTE — Addendum Note (Signed)
Addended by: Johnella MoloneyFUNDERBURK, JO A on: 09/19/2014 09:33 AM   Modules accepted: Orders

## 2015-06-10 ENCOUNTER — Other Ambulatory Visit: Payer: Self-pay | Admitting: Family Medicine

## 2015-07-09 ENCOUNTER — Ambulatory Visit (INDEPENDENT_AMBULATORY_CARE_PROVIDER_SITE_OTHER): Payer: Managed Care, Other (non HMO) | Admitting: Family Medicine

## 2015-07-09 ENCOUNTER — Encounter: Payer: Self-pay | Admitting: Family Medicine

## 2015-07-09 VITALS — BP 136/86 | HR 78 | Temp 98.4°F | Ht 69.34 in | Wt 335.2 lb

## 2015-07-09 DIAGNOSIS — E785 Hyperlipidemia, unspecified: Secondary | ICD-10-CM

## 2015-07-09 DIAGNOSIS — E669 Obesity, unspecified: Secondary | ICD-10-CM

## 2015-07-09 DIAGNOSIS — K219 Gastro-esophageal reflux disease without esophagitis: Secondary | ICD-10-CM

## 2015-07-09 DIAGNOSIS — I10 Essential (primary) hypertension: Secondary | ICD-10-CM

## 2015-07-09 LAB — BASIC METABOLIC PANEL
BUN: 7 mg/dL (ref 6–23)
CALCIUM: 9 mg/dL (ref 8.4–10.5)
CHLORIDE: 103 meq/L (ref 96–112)
CO2: 31 meq/L (ref 19–32)
Creatinine, Ser: 0.77 mg/dL (ref 0.40–1.50)
GFR: 119.21 mL/min (ref >60.00)
Glucose, Bld: 91 mg/dL (ref 70–99)
Potassium: 4.4 mEq/L (ref 3.5–5.1)
SODIUM: 139 meq/L (ref 135–145)

## 2015-07-09 LAB — HEMOGLOBIN A1C: Hgb A1c MFr Bld: 5.3 % (ref 4.6–6.5)

## 2015-07-09 LAB — LIPID PANEL
CHOL/HDL RATIO: 5
CHOLESTEROL: 217 mg/dL — AB (ref 0–200)
HDL: 40.3 mg/dL (ref >39.00)
LDL Cholesterol: 150 mg/dL — ABNORMAL HIGH (ref 0–99)
NonHDL: 176.28
TRIGLYCERIDES: 132 mg/dL (ref 0.0–149.0)
VLDL: 26.4 mg/dL (ref 0.0–40.0)

## 2015-07-09 MED ORDER — LISINOPRIL 20 MG PO TABS: 20.0000 mg | ORAL_TABLET | Freq: Every day | ORAL | Status: DC
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
## 2015-07-09 NOTE — Patient Instructions (Signed)
BEFORE YOU LEAVE: -labs -physical in 3-4 months  We recommend the following healthy lifestyle measures: - eat a healthy diet consisting of small portions of vegetables, fruits, beans, nuts, seeds, healthy meats such as white chicken and fish and whole grains.  - avoid fried foods, starches, sweets, fast food, processed foods, sodas, red meet and other fattening foods.  - get a least 150 minutes of aerobic exercise per week.

## 2015-07-09 NOTE — Progress Notes (Signed)
Pre visit review using our clinic review tool, if applicable. No additional management support is needed unless otherwise documented below in the visit note. 

## 2015-07-09 NOTE — Addendum Note (Signed)
Addended by: Johnella Moloney on: 07/09/2015 10:28 AM   Modules accepted: Orders

## 2015-11-13 ENCOUNTER — Encounter: Payer: Managed Care, Other (non HMO) | Admitting: Family Medicine

## 2015-11-17 ENCOUNTER — Ambulatory Visit (INDEPENDENT_AMBULATORY_CARE_PROVIDER_SITE_OTHER): Payer: Self-pay | Admitting: Family Medicine

## 2015-11-17 DIAGNOSIS — R69 Illness, unspecified: Secondary | ICD-10-CM

## 2015-11-17 NOTE — Progress Notes (Signed)
NO SHOW

## 2016-07-20 ENCOUNTER — Other Ambulatory Visit: Payer: Self-pay | Admitting: *Deleted

## 2016-07-20 MED ORDER — LISINOPRIL 20 MG PO TABS: 20.0000 mg | ORAL_TABLET | Freq: Every day | ORAL | 0 refills | Status: DC

## 2016-08-20 ENCOUNTER — Other Ambulatory Visit: Payer: Self-pay | Admitting: Family Medicine

## 2016-11-02 ENCOUNTER — Encounter (HOSPITAL_BASED_OUTPATIENT_CLINIC_OR_DEPARTMENT_OTHER): Payer: Self-pay | Admitting: Emergency Medicine

## 2016-11-02 ENCOUNTER — Emergency Department (HOSPITAL_BASED_OUTPATIENT_CLINIC_OR_DEPARTMENT_OTHER)
Admission: EM | Admit: 2016-11-02 | Discharge: 2016-11-02 | Disposition: A | Discharge status: Home / Self Care | Payer: Managed Care, Other (non HMO) | Attending: Emergency Medicine | Admitting: Emergency Medicine

## 2016-11-02 DIAGNOSIS — Z87891 Personal history of nicotine dependence: Secondary | ICD-10-CM | POA: Insufficient documentation

## 2016-11-02 DIAGNOSIS — Z79899 Other long term (current) drug therapy: Secondary | ICD-10-CM | POA: Diagnosis not present

## 2016-11-02 DIAGNOSIS — K0889 Other specified disorders of teeth and supporting structures: Secondary | ICD-10-CM | POA: Diagnosis present

## 2016-11-02 DIAGNOSIS — K029 Dental caries, unspecified: Secondary | ICD-10-CM | POA: Insufficient documentation

## 2016-11-02 DIAGNOSIS — I10 Essential (primary) hypertension: Secondary | ICD-10-CM | POA: Diagnosis not present

## 2016-11-02 MED ORDER — BUPIVACAINE-EPINEPHRINE (PF) 0.5% -1:200000 IJ SOLN
1.8000 mL | Freq: Once | INTRAMUSCULAR | Status: AC
  Administered 2016-11-02: 1.8 mL
  Filled 2016-11-02: qty 1.8

## 2016-11-02 MED ORDER — LISINOPRIL 10 MG PO TABS
20.0000 mg | ORAL_TABLET | Freq: Once | ORAL | Status: AC
Start: 2016-11-02 — End: 2016-11-02
  Administered 2016-11-02: 20 mg via ORAL
  Filled 2016-11-02: qty 2

## 2016-11-02 MED ORDER — OXYCODONE HCL 5 MG PO TABS
5.0000 mg | ORAL_TABLET | Freq: Once | ORAL | Status: AC
Start: 2016-11-02 — End: 2016-11-02
  Administered 2016-11-02: 5 mg via ORAL
  Filled 2016-11-02: qty 1

## 2016-11-02 MED ORDER — LISINOPRIL 20 MG PO TABS: 20.0000 mg | ORAL_TABLET | Freq: Every day | ORAL | 0 refills | Status: DC

## 2016-11-02 MED ORDER — CLINDAMYCIN HCL 150 MG PO CAPS: 450.0000 mg | ORAL_CAPSULE | Freq: Three times a day (TID) | ORAL | 0 refills | Status: AC

## 2016-11-02 MED ORDER — ACETAMINOPHEN 500 MG PO TABS
1000.0000 mg | ORAL_TABLET | Freq: Once | ORAL | Status: AC
  Administered 2016-11-02: 1000 mg via ORAL
  Filled 2016-11-02: qty 2

## 2016-11-02 MED ORDER — CLINDAMYCIN HCL 150 MG PO CAPS
450.0000 mg | ORAL_CAPSULE | Freq: Once | ORAL | Status: AC
  Administered 2016-11-02: 450 mg via ORAL
  Filled 2016-11-02: qty 3

## 2016-11-02 MED ORDER — IBUPROFEN 800 MG PO TABS
800.0000 mg | ORAL_TABLET | Freq: Once | ORAL | Status: AC
  Administered 2016-11-02: 800 mg via ORAL
  Filled 2016-11-02: qty 1

## 2016-11-02 NOTE — ED Triage Notes (Signed)
Patient reports needing significant dental work. Patient states he is having left upper dental pain with facial swelling. Patient took 2 tylenol and 1 ibuprofen without relief.
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
## 2016-11-02 NOTE — Discharge Instructions (Signed)
Take 4 over the counter ibuprofen tablets 3 times a day or 2 over-the-counter naproxen tablets twice a day for pain. Also take tylenol 1000mg(2 extra strength) four times a day.    

## 2017-08-10 ENCOUNTER — Encounter: Payer: Self-pay | Admitting: Family Medicine

## 2017-11-30 ENCOUNTER — Encounter: Payer: Self-pay | Admitting: Family Medicine

## 2017-12-14 ENCOUNTER — Encounter (HOSPITAL_COMMUNITY): Payer: Self-pay

## 2017-12-14 ENCOUNTER — Emergency Department (HOSPITAL_COMMUNITY)
Admission: EM | Admit: 2017-12-14 | Discharge: 2017-12-14 | Disposition: A | Discharge status: Home / Self Care | Payer: Worker's Compensation | Attending: Emergency Medicine | Admitting: Emergency Medicine

## 2017-12-14 ENCOUNTER — Emergency Department (HOSPITAL_COMMUNITY): Payer: Worker's Compensation

## 2017-12-14 ENCOUNTER — Other Ambulatory Visit: Payer: Self-pay

## 2017-12-14 DIAGNOSIS — S61210A Laceration without foreign body of right index finger without damage to nail, initial encounter: Secondary | ICD-10-CM

## 2017-12-14 DIAGNOSIS — Y9389 Activity, other specified: Secondary | ICD-10-CM | POA: Insufficient documentation

## 2017-12-14 DIAGNOSIS — Z87891 Personal history of nicotine dependence: Secondary | ICD-10-CM | POA: Insufficient documentation

## 2017-12-14 DIAGNOSIS — S6991XA Unspecified injury of right wrist, hand and finger(s), initial encounter: Secondary | ICD-10-CM | POA: Diagnosis present

## 2017-12-14 DIAGNOSIS — W208XXA Other cause of strike by thrown, projected or falling object, initial encounter: Secondary | ICD-10-CM | POA: Insufficient documentation

## 2017-12-14 DIAGNOSIS — Z79899 Other long term (current) drug therapy: Secondary | ICD-10-CM | POA: Insufficient documentation

## 2017-12-14 DIAGNOSIS — Y929 Unspecified place or not applicable: Secondary | ICD-10-CM | POA: Diagnosis not present

## 2017-12-14 DIAGNOSIS — Y990 Civilian activity done for income or pay: Secondary | ICD-10-CM | POA: Insufficient documentation

## 2017-12-14 DIAGNOSIS — I10 Essential (primary) hypertension: Secondary | ICD-10-CM | POA: Insufficient documentation

## 2017-12-14 IMAGING — DX DG FINGER INDEX 2+V*R*
3 series · 3 of 3 positions shown · non-contrast
Comparison: None.

CLINICAL DATA: Laceration of the right index finger while
installing countertop.

EXAM:
RIGHT INDEX FINGER 2+V

[finger ap]
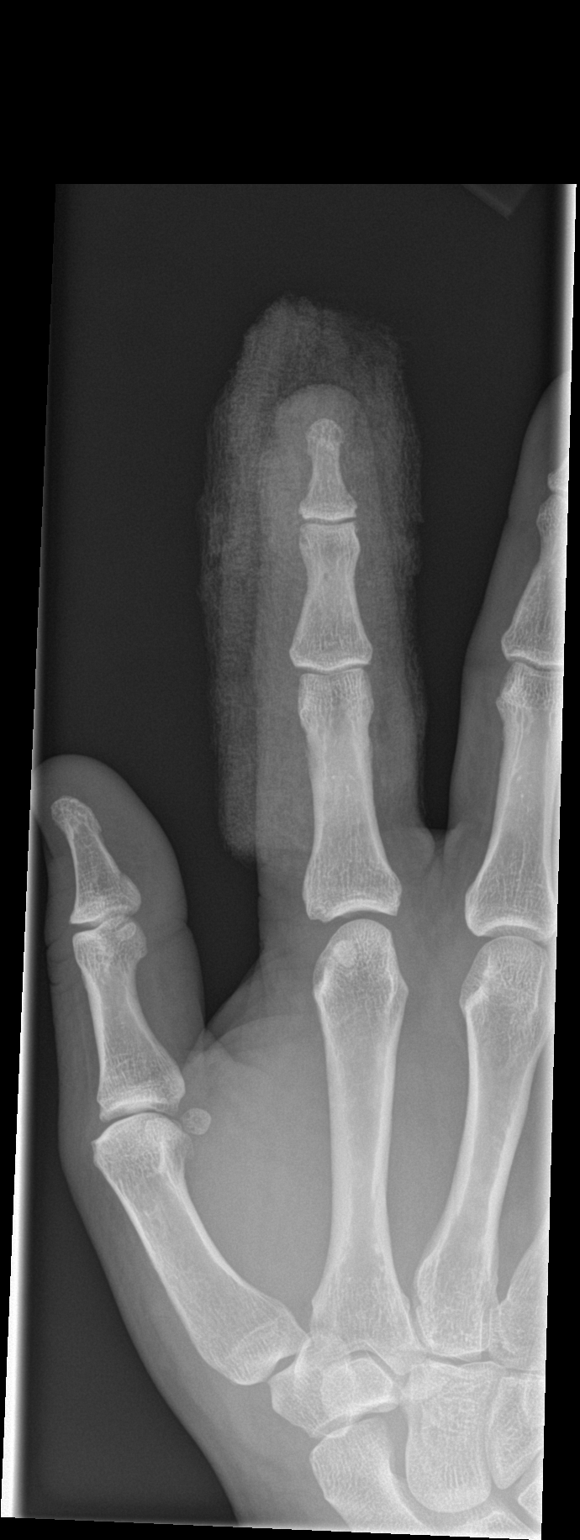

[finger obl]
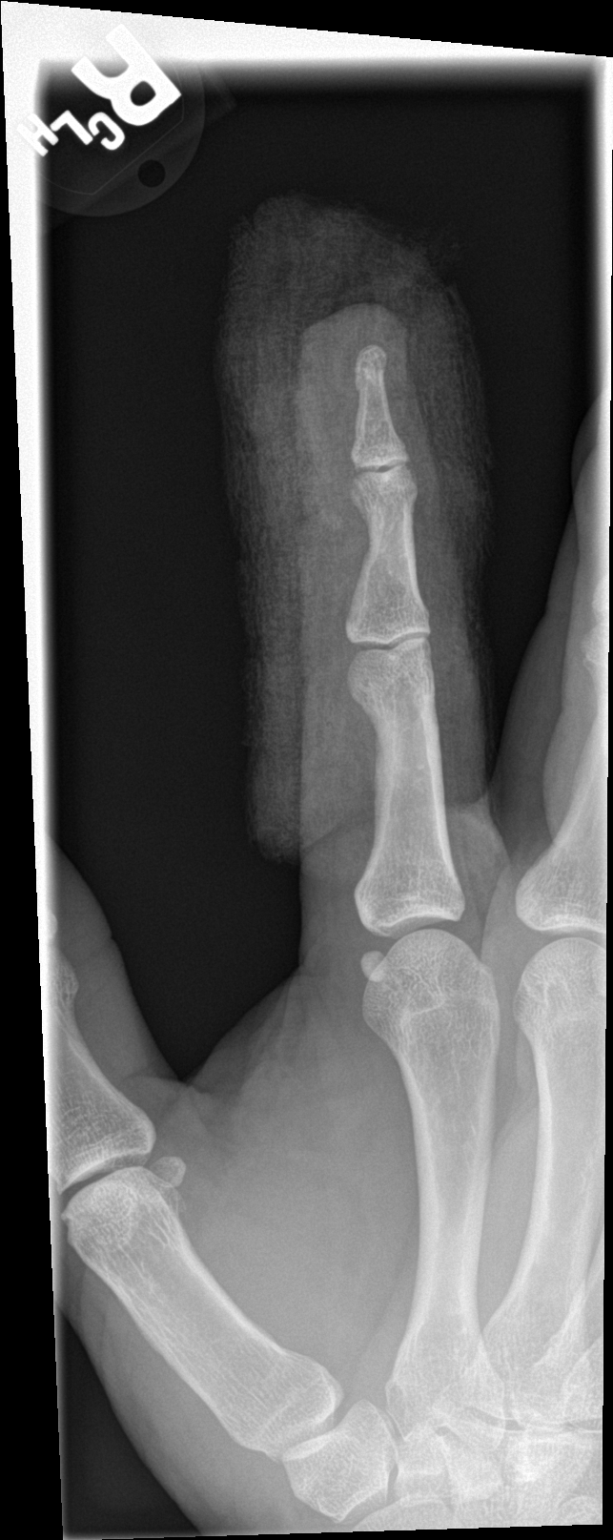

[finger lat]
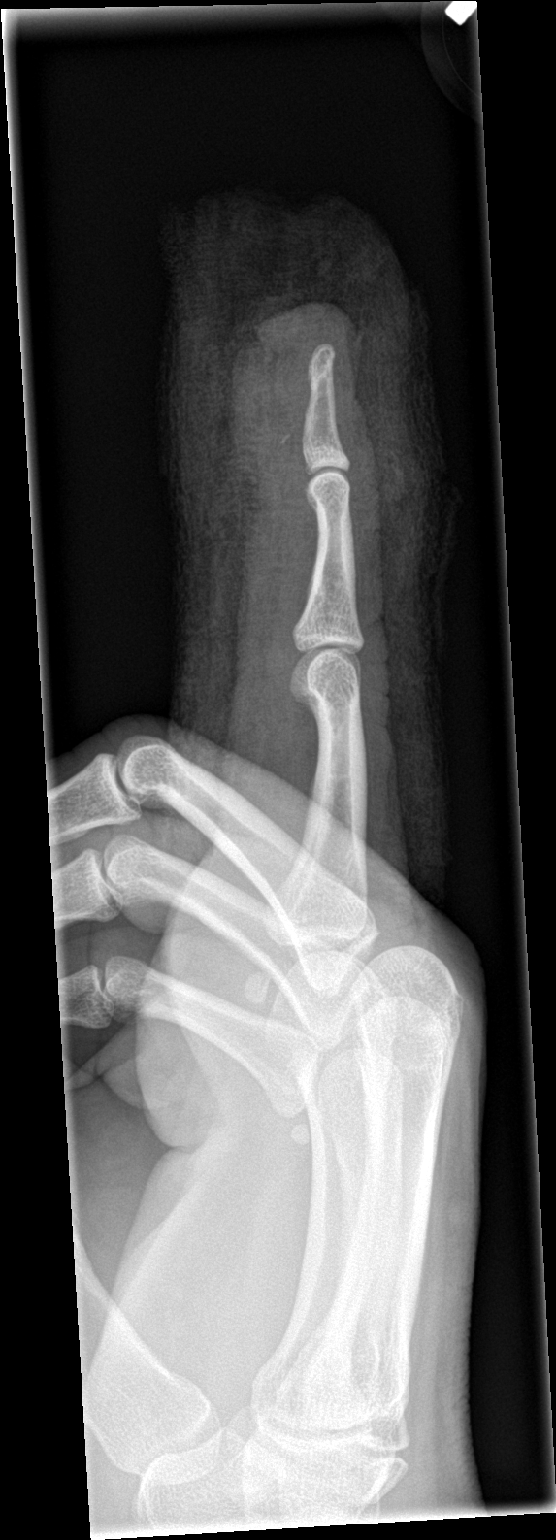

[3 of 3 positions shown; findings below may reference images not displayed]

FINDINGS: Soft tissue defect/laceration along the tip of the right index
finger. No acute fracture joint dislocation. Fine bony detail is
limited by overlying gauze. On the lateral view there is a linear
hyperdensity that could potentially be a foreign body versus debris
on the skin or within the gauze.
IMPRESSION: Soft tissue laceration along the volar aspect of the index finger.
No acute fracture.

Possible soft tissue 2 mm in length linear radiopaque foreign body
along the volar aspect of the index finger at the level of the mid
distal phalanx.

## 2017-12-14 MED ORDER — IBUPROFEN 800 MG PO TABS: 800.0000 mg | ORAL_TABLET | Freq: Three times a day (TID) | ORAL | 0 refills | Status: DC

## 2017-12-14 MED ORDER — LIDOCAINE HCL (PF) 1 % IJ SOLN
INTRAMUSCULAR | Status: AC
  Filled 2017-12-14: qty 30

## 2017-12-14 MED ORDER — OXYCODONE-ACETAMINOPHEN 5-325 MG PO TABS
1.0000 | ORAL_TABLET | ORAL | Status: DC | PRN
  Administered 2017-12-14: 1 via ORAL
  Filled 2017-12-14: qty 1

## 2017-12-14 MED ORDER — TETANUS-DIPHTH-ACELL PERTUSSIS 5-2.5-18.5 LF-MCG/0.5 IM SUSP
0.5000 mL | Freq: Once | INTRAMUSCULAR | Status: DC
  Filled 2017-12-14: qty 0.5

## 2017-12-14 MED ORDER — HYDROCODONE-ACETAMINOPHEN 5-325 MG PO TABS: 2.0000 | ORAL_TABLET | ORAL | 0 refills | Status: DC | PRN

## 2017-12-14 MED ORDER — CEPHALEXIN 500 MG PO CAPS: 500.0000 mg | ORAL_CAPSULE | Freq: Three times a day (TID) | ORAL | 0 refills | Status: AC

## 2017-12-14 MED ORDER — BACITRACIN ZINC 500 UNIT/GM EX OINT
1.0000 "application " | TOPICAL_OINTMENT | Freq: Two times a day (BID) | CUTANEOUS | Status: DC
  Filled 2017-12-14: qty 0.9

## 2017-12-14 MED ORDER — LIDOCAINE HCL (PF) 1 % IJ SOLN
10.0000 mL | Freq: Once | INTRAMUSCULAR | Status: AC
  Administered 2017-12-14: 10 mL via INTRADERMAL
  Filled 2017-12-14: qty 10
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
## 2017-12-14 NOTE — Discharge Instructions (Signed)
You have 8 stitches in your wound, please make sure that you follow-up with the surgeon listed above for an evaluation of your finger within the next 5 days.  If you should develop severe or increasing pain swelling redness pus or fever please return to the emergency department or seek medical care immediately.  Keflex 500 mg 3 times daily for 7 days to prevent infection  Ibuprofen every 8 hours as needed for pain  Hydrocodone 5 mg tablets 1 every 6 hours as needed for severe pain.

## 2017-12-14 NOTE — ED Triage Notes (Signed)
Pt crushed his right index finger while installing counter tops. Deep laceration to the end of the finger. Bleeding controlled. Pt able to move the digit and has good sensation.

## 2019-05-14 ENCOUNTER — Other Ambulatory Visit: Payer: Self-pay

## 2019-05-14 ENCOUNTER — Emergency Department (HOSPITAL_COMMUNITY)
Admission: EM | Admit: 2019-05-14 | Discharge: 2019-05-14 | Disposition: A | Discharge status: Home / Self Care | Payer: BC Managed Care – PPO | Attending: Emergency Medicine | Admitting: Emergency Medicine

## 2019-05-14 DIAGNOSIS — M79676 Pain in unspecified toe(s): Secondary | ICD-10-CM | POA: Diagnosis present

## 2019-05-14 DIAGNOSIS — Z79899 Other long term (current) drug therapy: Secondary | ICD-10-CM | POA: Diagnosis not present

## 2019-05-14 DIAGNOSIS — I10 Essential (primary) hypertension: Secondary | ICD-10-CM | POA: Diagnosis not present

## 2019-05-14 DIAGNOSIS — M109 Gout, unspecified: Secondary | ICD-10-CM

## 2019-05-14 DIAGNOSIS — Z87891 Personal history of nicotine dependence: Secondary | ICD-10-CM | POA: Diagnosis not present

## 2019-05-14 DIAGNOSIS — M10071 Idiopathic gout, right ankle and foot: Secondary | ICD-10-CM | POA: Diagnosis not present

## 2019-05-14 LAB — CBG MONITORING, ED: Glucose-Capillary: 110 mg/dL — ABNORMAL HIGH (ref 70–99)

## 2019-05-14 LAB — I-STAT CREATININE, ED: Creatinine, Ser: 0.8 mg/dL (ref 0.61–1.24)

## 2019-05-14 MED ORDER — PREDNISONE 10 MG PO TABS: ORAL_TABLET | ORAL | 0 refills | Status: AC

## 2019-05-14 MED ORDER — LISINOPRIL 20 MG PO TABS
20.0000 mg | ORAL_TABLET | Freq: Once | ORAL | Status: AC
  Administered 2019-05-14: 10:00:00 20 mg via ORAL
  Filled 2019-05-14: qty 1

## 2019-05-14 MED ORDER — LISINOPRIL 20 MG PO TABS: 40.0000 mg | ORAL_TABLET | Freq: Once | ORAL | Status: DC

## 2019-05-14 MED ORDER — LISINOPRIL 20 MG PO TABS: 20.0000 mg | ORAL_TABLET | Freq: Every day | ORAL | 0 refills | Status: DC

## 2019-05-14 NOTE — Discharge Instructions (Addendum)
Please see the information and instructions below regarding your visit.  Your diagnoses today include:  1. Podagra   2. Gouty arthritis of right great toe   3. Essential hypertension     Tests performed today include: See side panel of your discharge paperwork for testing performed today. Vital signs are listed at the bottom of these instructions.   Blood sugar and kidney function are normal today.   Medications prescribed:    Take any prescribed medications only as prescribed, and any over the counter medications only as directed on the packaging.  You are prescribed prednisone, a steroid. This is a medication to help reduce inflammation in the gouty arthritis.  Common side effects include upset stomach/nausea. You may take this medicine with food if this occurs. Other side effects include restlessness, difficulty sleeping, and increased sweating. Call your healthcare provider if these do not resolve after finishing the medication.  This medicine may increase your blood sugar so additional careful monitoring is needed of blood sugar if you have diabetes. Call your healthcare provider for any signs/symtpoms of high blood sugar such as confusion, feeling sleepy, more thirst, more hunger, passing urine more often, flushing, fast breathing, or breath that smells like fruit.  Please start taking your lisinopril 20 mg daily.  Home care instructions:  Please follow any educational materials contained in this packet.   Follow-up instructions: Please follow-up with your primary care provider in 1-2 weeks for further evaluation of your symptoms if they are not completely improved.   Return instructions:  Please return to the Emergency Department if you experience worsening symptoms.  Please come back to the emergency department if you develop any increasing redness, pain, swelling, or fever or chills. Please return if you have any other emergent concerns.  Additional Information:   Your  vital signs today were: BP (!) 192/105    Pulse 98    Temp 98.3 F (36.8 C) (Oral)    Resp 12    Ht 5\' 11"  (1.803 m)    Wt 124.7 kg    SpO2 98%    BMI 38.35 kg/m  If your blood pressure (BP) was elevated on multiple readings during this visit above 130 for the top number or above 80 for the bottom number, please have this repeated by your primary care provider within one month. --------------  Thank you for allowing [place] to participate in your care today.

## 2019-05-14 NOTE — ED Triage Notes (Signed)
Pt. Stated, I have gout on rt. Toe area that started on Sunday
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
## 2019-05-14 NOTE — ED Notes (Signed)
Patient verbalizes understanding of discharge instructions. Opportunity for questioning and answers were provided. Armband removed by staff, pt discharged from ED home via POV.  

## 2022-01-25 ENCOUNTER — Inpatient Hospital Stay (HOSPITAL_COMMUNITY)
Admission: EM | Admit: 2022-01-25 | Discharge: 2022-01-27 | DRG: 065 | Disposition: A | Discharge status: Home / Self Care | Payer: Self-pay | Attending: Internal Medicine | Admitting: Internal Medicine

## 2022-01-25 ENCOUNTER — Emergency Department (HOSPITAL_COMMUNITY): Payer: Self-pay

## 2022-01-25 ENCOUNTER — Other Ambulatory Visit: Payer: Self-pay

## 2022-01-25 DIAGNOSIS — Z6841 Body Mass Index (BMI) 40.0 and over, adult: Secondary | ICD-10-CM

## 2022-01-25 DIAGNOSIS — Z8249 Family history of ischemic heart disease and other diseases of the circulatory system: Secondary | ICD-10-CM

## 2022-01-25 DIAGNOSIS — I639 Cerebral infarction, unspecified: Secondary | ICD-10-CM

## 2022-01-25 DIAGNOSIS — H532 Diplopia: Secondary | ICD-10-CM | POA: Diagnosis present

## 2022-01-25 DIAGNOSIS — Z79899 Other long term (current) drug therapy: Secondary | ICD-10-CM

## 2022-01-25 DIAGNOSIS — I6389 Other cerebral infarction: Secondary | ICD-10-CM | POA: Diagnosis present

## 2022-01-25 DIAGNOSIS — G4733 Obstructive sleep apnea (adult) (pediatric): Secondary | ICD-10-CM | POA: Diagnosis present

## 2022-01-25 DIAGNOSIS — F17210 Nicotine dependence, cigarettes, uncomplicated: Secondary | ICD-10-CM | POA: Diagnosis present

## 2022-01-25 DIAGNOSIS — Z20822 Contact with and (suspected) exposure to covid-19: Secondary | ICD-10-CM | POA: Diagnosis present

## 2022-01-25 DIAGNOSIS — E785 Hyperlipidemia, unspecified: Secondary | ICD-10-CM | POA: Diagnosis present

## 2022-01-25 DIAGNOSIS — I160 Hypertensive urgency: Secondary | ICD-10-CM | POA: Diagnosis present

## 2022-01-25 DIAGNOSIS — Q2112 Patent foramen ovale: Secondary | ICD-10-CM

## 2022-01-25 DIAGNOSIS — R29701 NIHSS score 1: Secondary | ICD-10-CM | POA: Diagnosis present

## 2022-01-25 DIAGNOSIS — K219 Gastro-esophageal reflux disease without esophagitis: Secondary | ICD-10-CM | POA: Diagnosis present

## 2022-01-25 DIAGNOSIS — H5500 Unspecified nystagmus: Secondary | ICD-10-CM | POA: Diagnosis present

## 2022-01-25 DIAGNOSIS — I10 Essential (primary) hypertension: Secondary | ICD-10-CM | POA: Diagnosis present

## 2022-01-25 DIAGNOSIS — I6381 Other cerebral infarction due to occlusion or stenosis of small artery: Principal | ICD-10-CM | POA: Diagnosis present

## 2022-01-25 LAB — URINALYSIS, ROUTINE W REFLEX MICROSCOPIC
Bilirubin Urine: NEGATIVE
Glucose, UA: NEGATIVE mg/dL
Ketones, ur: NEGATIVE mg/dL
Leukocytes,Ua: NEGATIVE
Nitrite: NEGATIVE
Protein, ur: NEGATIVE mg/dL
Specific Gravity, Urine: 1.018 (ref 1.005–1.030)
pH: 6 (ref 5.0–8.0)

## 2022-01-25 LAB — PROTIME-INR
INR: 1 (ref 0.8–1.2)
Prothrombin Time: 13.2 seconds (ref 11.4–15.2)

## 2022-01-25 LAB — HEPATIC FUNCTION PANEL
ALT: 15 U/L (ref 0–44)
AST: 16 U/L (ref 15–41)
Albumin: 3.7 g/dL (ref 3.5–5.0)
Alkaline Phosphatase: 66 U/L (ref 38–126)
Bilirubin, Direct: 0.1 mg/dL (ref 0.0–0.2)
Indirect Bilirubin: 0.8 mg/dL (ref 0.3–0.9)
Total Bilirubin: 0.9 mg/dL (ref 0.3–1.2)
Total Protein: 6.9 g/dL (ref 6.5–8.1)

## 2022-01-25 LAB — RESP PANEL BY RT-PCR (FLU A&B, COVID) ARPGX2
Influenza A by PCR: NEGATIVE
Influenza B by PCR: NEGATIVE
SARS Coronavirus 2 by RT PCR: NEGATIVE

## 2022-01-25 LAB — CBC WITH DIFFERENTIAL/PLATELET
Abs Immature Granulocytes: 0.05 10*3/uL (ref 0.00–0.07)
Basophils Absolute: 0 10*3/uL (ref 0.0–0.1)
Basophils Relative: 0 %
Eosinophils Absolute: 0.1 10*3/uL (ref 0.0–0.5)
Eosinophils Relative: 1 %
HCT: 51.8 % (ref 39.0–52.0)
Hemoglobin: 17.9 g/dL — ABNORMAL HIGH (ref 13.0–17.0)
Immature Granulocytes: 0 %
Lymphocytes Relative: 16 %
Lymphs Abs: 2 10*3/uL (ref 0.7–4.0)
MCH: 30.4 pg (ref 26.0–34.0)
MCHC: 34.6 g/dL (ref 30.0–36.0)
MCV: 88.1 fL (ref 80.0–100.0)
Monocytes Absolute: 0.5 10*3/uL (ref 0.1–1.0)
Monocytes Relative: 4 %
Neutro Abs: 9.4 10*3/uL — ABNORMAL HIGH (ref 1.7–7.7)
Neutrophils Relative %: 79 %
Platelets: 203 10*3/uL (ref 150–400)
RBC: 5.88 MIL/uL — ABNORMAL HIGH (ref 4.22–5.81)
RDW: 13.2 % (ref 11.5–15.5)
WBC: 12.1 10*3/uL — ABNORMAL HIGH (ref 4.0–10.5)
nRBC: 0 % (ref 0.0–0.2)

## 2022-01-25 LAB — BASIC METABOLIC PANEL
Anion gap: 9 (ref 5–15)
BUN: 7 mg/dL (ref 6–20)
CO2: 25 mmol/L (ref 22–32)
Calcium: 8.9 mg/dL (ref 8.9–10.3)
Chloride: 103 mmol/L (ref 98–111)
Creatinine, Ser: 0.86 mg/dL (ref 0.61–1.24)
GFR, Estimated: >60 mL/min (ref >60)
Glucose, Bld: 133 mg/dL — ABNORMAL HIGH (ref 70–99)
Potassium: 4.1 mmol/L (ref 3.5–5.1)
Sodium: 137 mmol/L (ref 135–145)

## 2022-01-25 LAB — RAPID URINE DRUG SCREEN, HOSP PERFORMED
Amphetamines: NOT DETECTED
Barbiturates: NOT DETECTED
Benzodiazepines: NOT DETECTED
Cocaine: NOT DETECTED
Opiates: POSITIVE — AB
Tetrahydrocannabinol: NOT DETECTED

## 2022-01-25 LAB — APTT: aPTT: 31 seconds (ref 24–36)

## 2022-01-25 LAB — ETHANOL: Alcohol, Ethyl (B): <10 mg/dL (ref <10)

## 2022-01-25 IMAGING — MR MR MRA NECK WO/W CM
6 of 8 series · 36 of 48 positions shown · IV contrast (Contrast agent)
Comparison: Prior head CT from earlier the same day.

CLINICAL DATA: Initial evaluation for acute neuro deficit, stroke
suspected.



[Series 10: tof_fl3d_tra_iso · axial · 0.6mm · 0.52mm/px · z∈[-175,-96]mm · 8 of 133 slices shown]
[im 1/133]
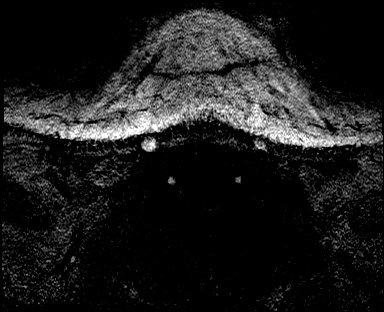
[im 19/133]
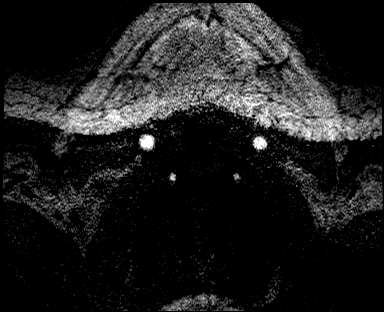
[im 38/133]
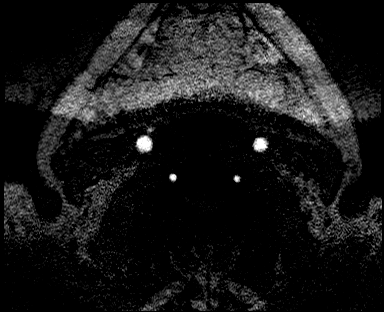
[im 57/133]
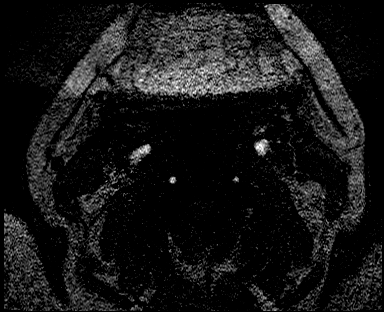
[im 76/133]
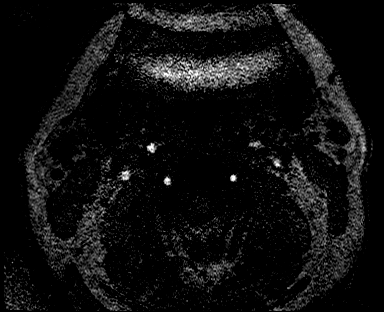
[im 95/133]
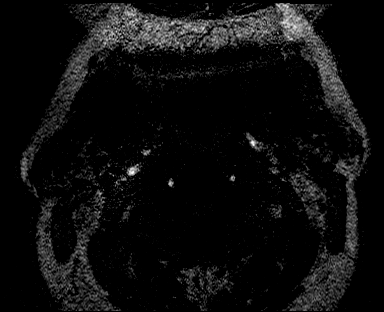
[im 114/133]
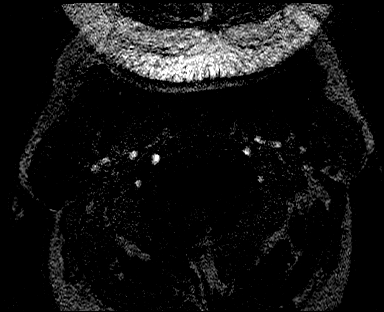
[im 133/133]
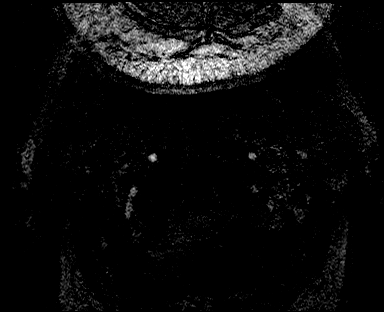

[Series 13: angio_fl3d_cor_pre_ttc=3.0s · coronal · 0.9mm · 0.85mm/px · 5 of 80 slices shown]
[im 1/80]
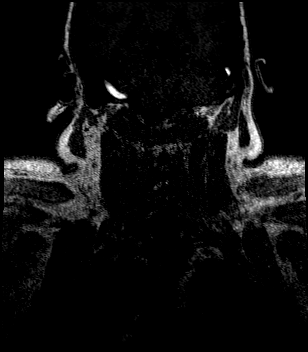
[im 20/80]
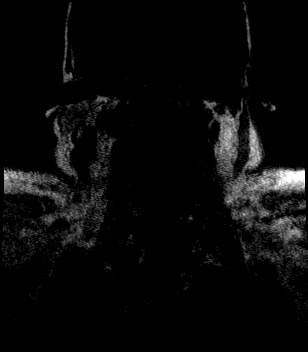
[im 40/80]
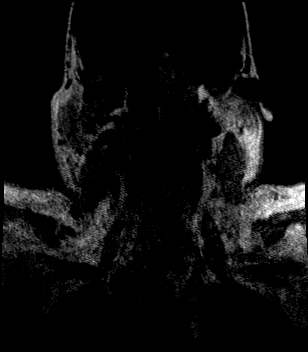
[im 60/80]
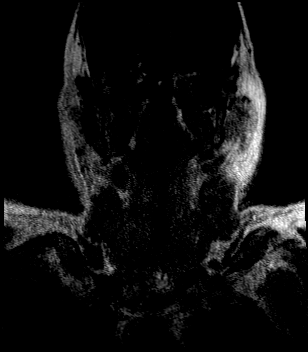
[im 80/80]
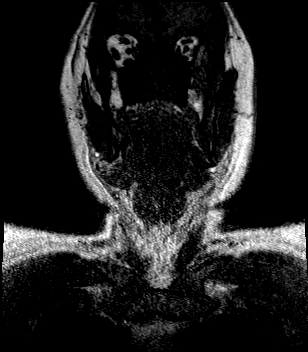

[Series 15: angio_fl3d_cor_post_ttc=3.0s · coronal · 0.9mm · 0.85mm/px · 6 of 80 slices shown (1 of 2)]
[im 1/80]
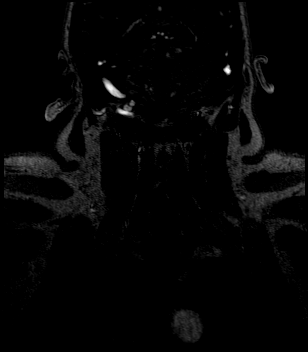
[im 16/80]
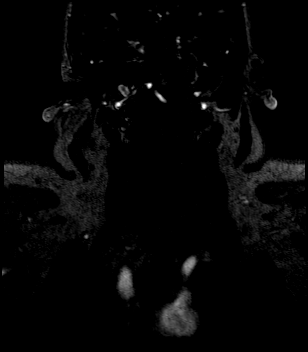
[im 32/80]
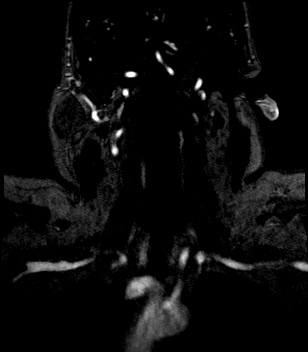
[im 48/80]
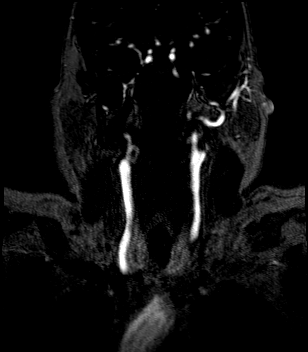
[im 64/80]
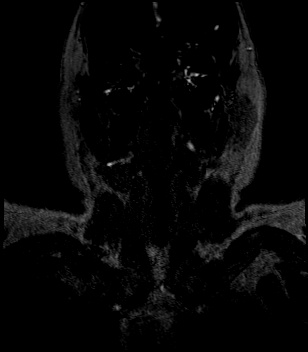
[im 80/80]
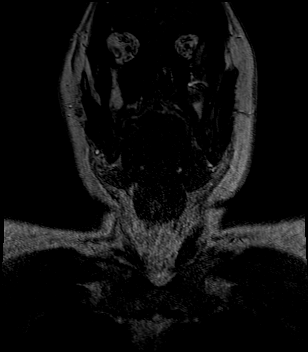

[Series 16: angio_fl3d_cor_post_ttc=3.0s_moco-adv · coronal · 0.9mm · 0.85mm/px · 6 of 80 slices shown]
[im 1/80]
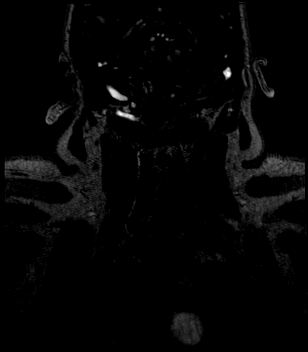
[im 16/80]
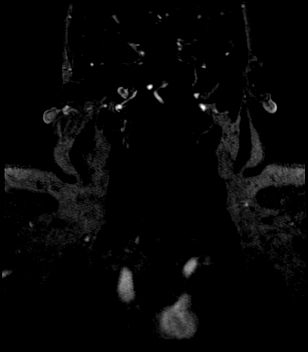
[im 32/80]
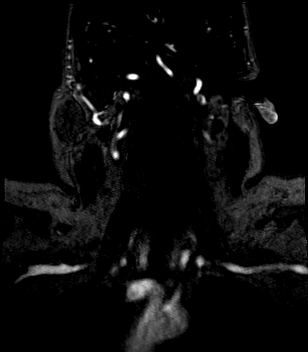
[im 48/80]
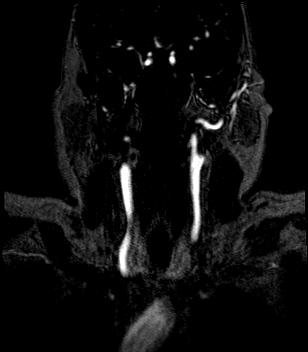
[im 64/80]
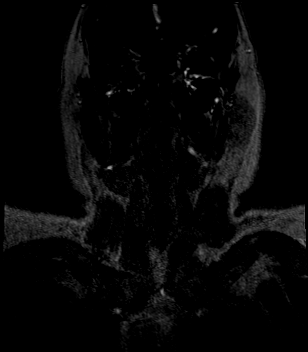
[im 80/80]
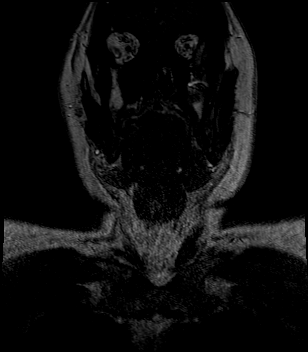

[Series 17: angio_fl3d_cor_post_ttc=3.0s_moco-adv_sub · coronal · 0.9mm · 0.85mm/px · 5 of 74 slices shown]
[im 1/74]
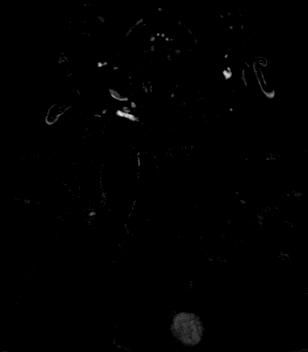
[im 19/74]
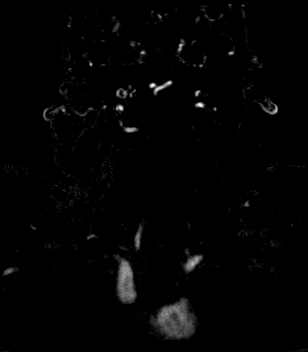
[im 37/74]
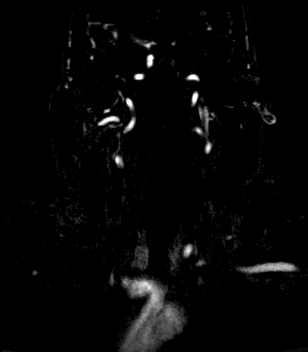
[im 55/74]
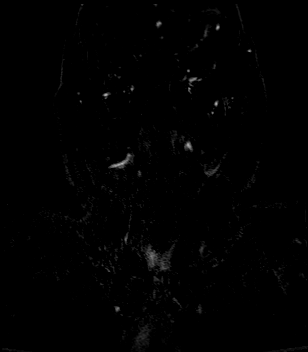
[im 74/74]
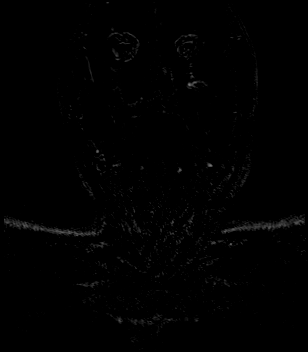

[Series 19: angio_fl3d_cor_post_ttc=3.0s · coronal · 0.9mm · 0.85mm/px · 6 of 80 slices shown (2 of 2)]
[im 1/80]
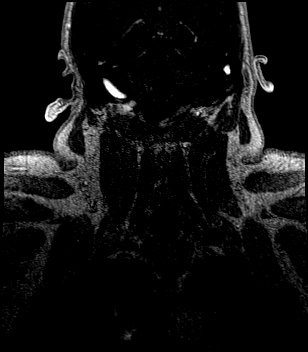
[im 16/80]
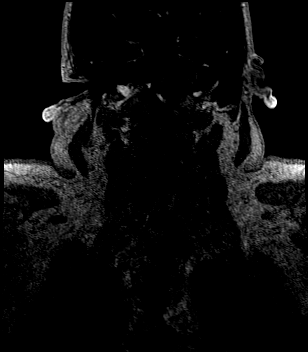
[im 32/80]
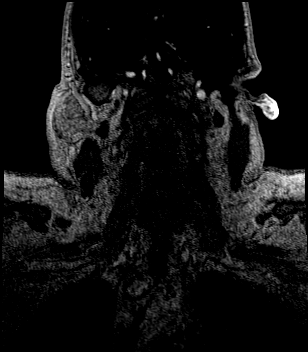
[im 48/80]
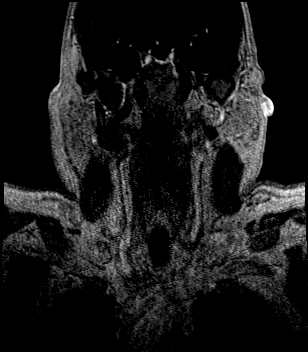
[im 64/80]
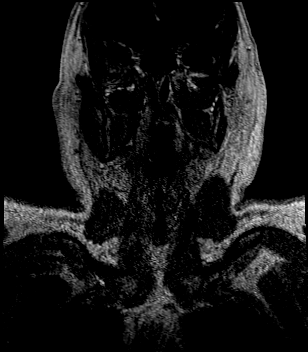
[im 80/80]
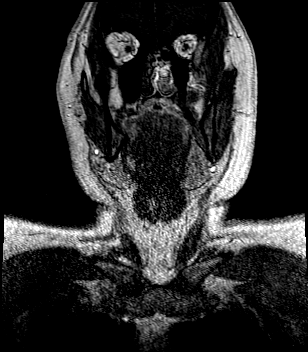

[36 of 48 positions shown; findings below may reference images not displayed]

FINDINGS: MRI HEAD FINDINGS

Brain: Cerebral volume within normal limits. Patchy T2/FLAIR
hyperintensity involving the periventricular and deep white matter
both cerebral hemispheres as well as the pons, most likely related
to chronic microvascular ischemic disease. Multiple scattered
superimposed remote lacunar infarcts noted about the corpus
callosum, periventricular white matter, deep gray nuclei, and pons.

9 mm focus of restricted diffusion seen involving the medial right
thalamus (series 5, image 81). Additional 1.2 cm focus of restricted
diffusion involving the inferior medial left cerebellum (series 5,
image 63). Findings consistent with acute ischemic infarcts.
Additional 2.1 cm focus of mild diffusion abnormality involving the
right splenium (series 5, image 8). Mild patchy diffusion
abnormality noted at the right basal ganglia/internal capsule
(series 5, image 83). Findings felt to be most consistent with
evolving subacute ischemic changes. Minimal petechial blood products
noted about the few of these areas of ischemia without frank
hemorrhagic transformation or significant mass effect.

No other evidence for acute or subacute ischemia. Gray-white matter
differentiation otherwise maintained. No areas of chronic cortical
infarction. No acute intracranial hemorrhage. Few scattered chronic
micro hemorrhages noted, most pronounced at the pons, most likely
hypertensive in nature.

No mass lesion, midline shift or mass effect. No hydrocephalus or
extra-axial fluid collection. Pituitary gland suprasellar region
normal.

Vascular: Major intracranial vascular flow voids are maintained.

Skull and upper cervical spine: Craniocervical junction within
normal limits. Bone marrow signal intensity diffusely decreased on
T1 weighted sequence, nonspecific, but most commonly related to
anemia, smoking or obesity. No focal marrow replacing lesion. No
scalp soft tissue abnormality.

Sinuses/Orbits: Right gaze noted. Globes orbital soft tissues
demonstrate no other acute finding. Scattered mucosal thickening
noted throughout the ethmoidal air cells. Paranasal sinuses are
otherwise largely clear. No significant mastoid effusion.

Other: None.

MRA HEAD FINDINGS

Anterior circulation: Both internal carotid arteries patent to the
termini without significant stenosis. Mild atheromatous irregularity
noted throughout the carotid siphons. A1 segments patent
bilaterally. Left A1 hypoplastic. Normal anterior communicating
artery complex. Anterior cerebral arteries patent without
significant stenosis. No M1 stenosis or occlusion. Normal MCA
bifurcations. No proximal MCA branch occlusion. Distal small vessel
atheromatous irregularity seen throughout the MCA branches
bilaterally.

Posterior circulation: Both vertebral arteries patent to the
vertebrobasilar junction without stenosis. Right vertebral artery
slightly dominant. Both PICA patent. Basilar patent to its distal
aspect without stenosis. Superior cerebellar and posterior cerebral
arteries patent bilaterally. Distal small atheromatous changes noted
within the distal PCA branches bilaterally.

Anatomic variants: None significant.  No aneurysm.

MRA NECK FINDINGS

Aortic arch: Examination mildly degraded by motion artifact.

Visualized aortic arch normal caliber with normal 3 vessel
morphology. No stenosis seen about the origin the great vessels.

Right carotid system: Right common and internal carotid arteries
patent without stenosis or evidence for dissection. No significant
atheromatous change or narrowing about the right carotid bulb.

Left carotid system: Left common and internal carotid arteries
patent without stenosis or evidence for dissection. No significant
atheromatous change or irregularity about the left carotid bulb.

Vertebral arteries: Both vertebral arteries arise from subclavian
arteries. No proximal subclavian artery stenosis. Right vertebral
artery slightly dominant. Vertebral arteries widely patent without
stenosis or evidence for dissection.

Other: None
IMPRESSION: MRI HEAD IMPRESSION:

1. Patchy small volume acute to early subacute ischemic infarcts
involving the right thalamus, right basal ganglia, right splenium,
and left cerebellum as above.
2. Underlying cerebral white matter disease with multiple probable
superimposed remote lacunar infarcts as above. Overall appearance of
the brain is favored to reflect that of chronic microvascular
ischemic disease with multiple remote lacunar infarcts. Possible
demyelinating disease could conceivably have this appearance as
well, and could be considered in the correct clinical setting.

MRA HEAD IMPRESSION:

1. Negative intracranial MRA for large vessel occlusion.
2. Distal small vessel atheromatous irregularity. No hemodynamically
significant or correctable stenosis.

MRA NECK IMPRESSION:

Wide patency of both carotid artery systems and vertebral arteries
within the neck.

## 2022-01-25 IMAGING — MR MR MRA HEAD W/O CM
1 series · 16 of 48 positions shown · IV contrast (gadavist)
Comparison: Prior head CT from earlier the same day.

CLINICAL DATA: Initial evaluation for acute neuro deficit, stroke
suspected.



[Series 5: 3d cow · axial · 0.5mm · 0.41mm/px · z∈[-87,+0]mm · 16 of 188 slices shown]
[im 1/188]
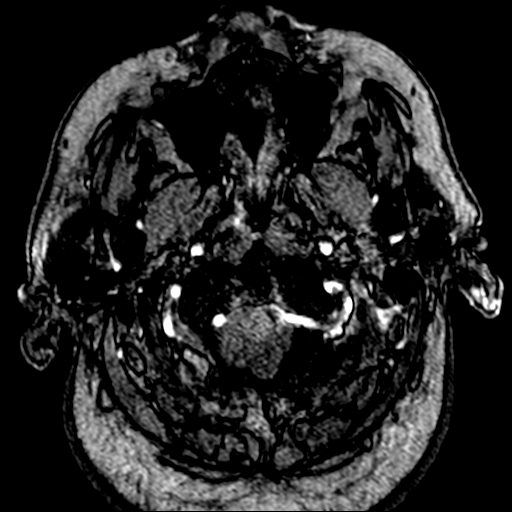
[im 4/188]
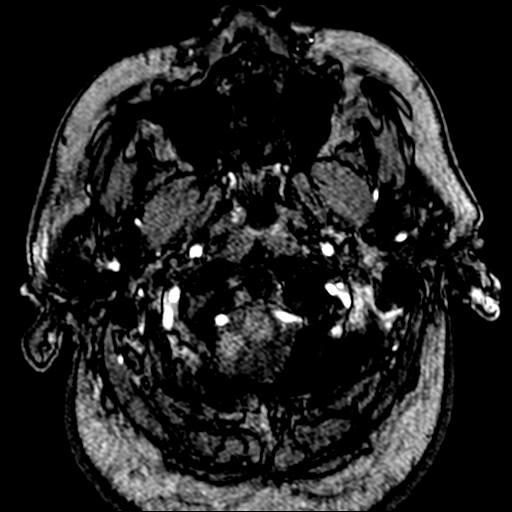
[im 8/188]
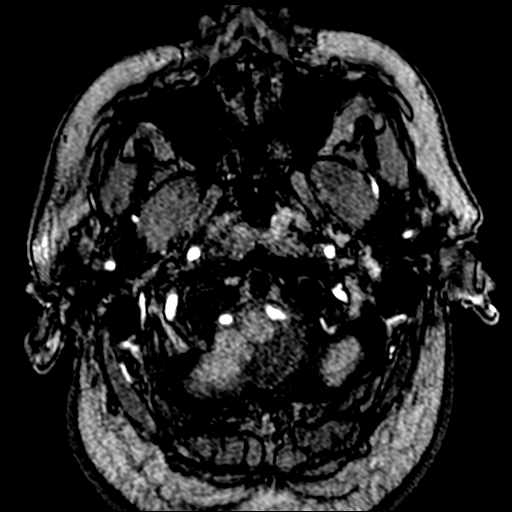
[im 12/188]
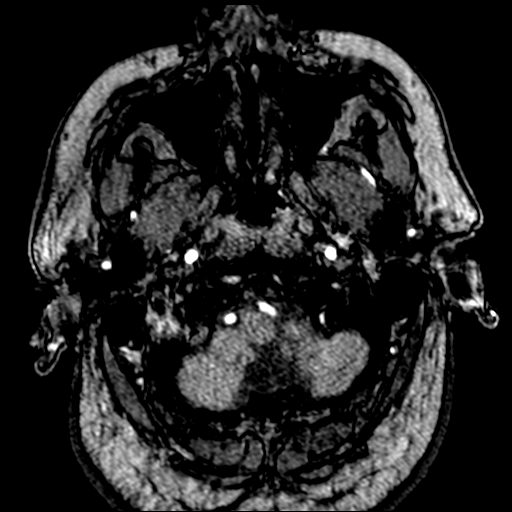
[im 16/188]
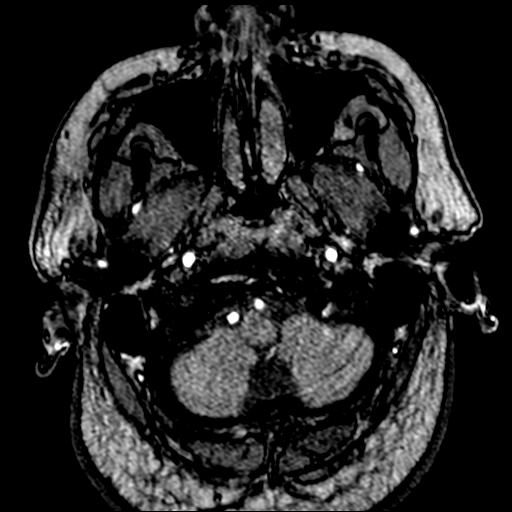
[im 20/188]
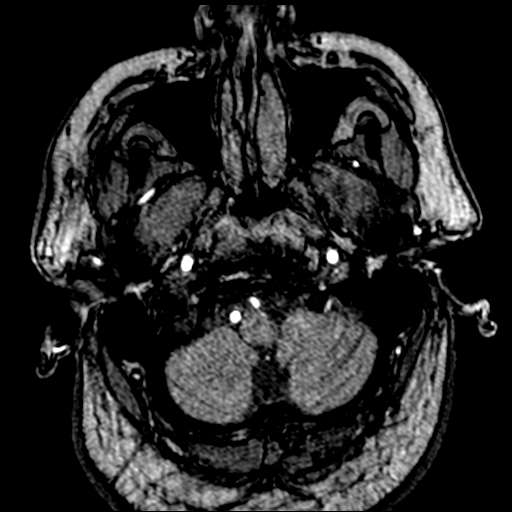
[im 32/188]
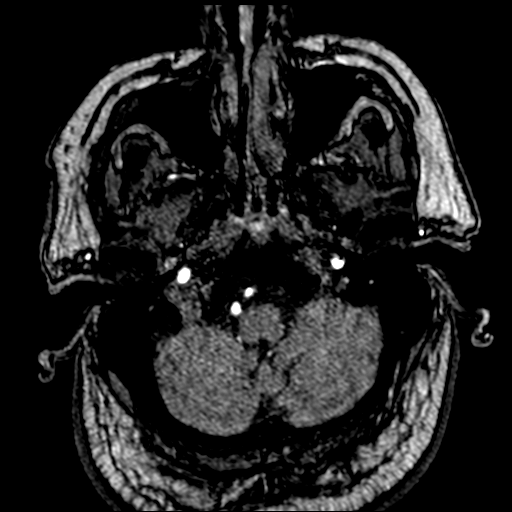
[im 36/188]
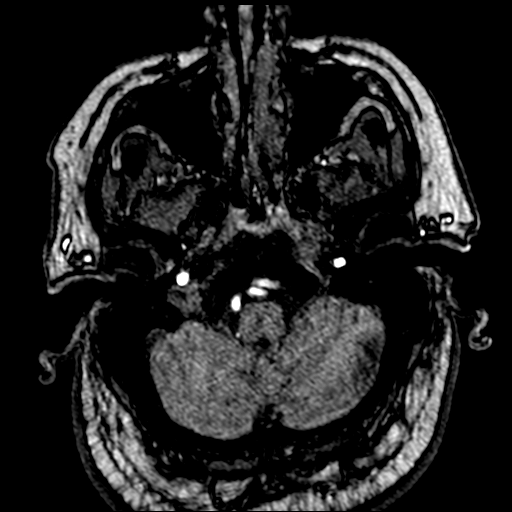
[im 60/188]
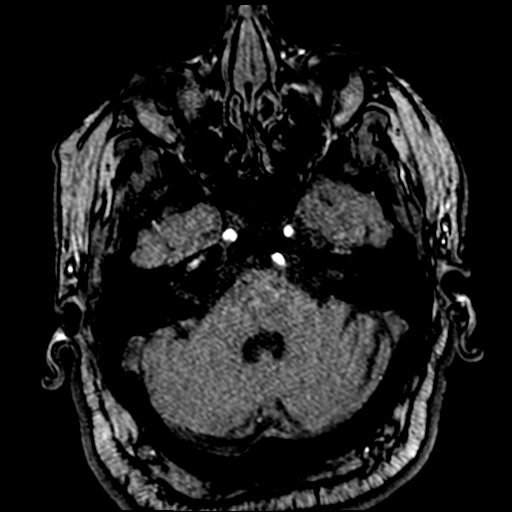
[im 84/188]
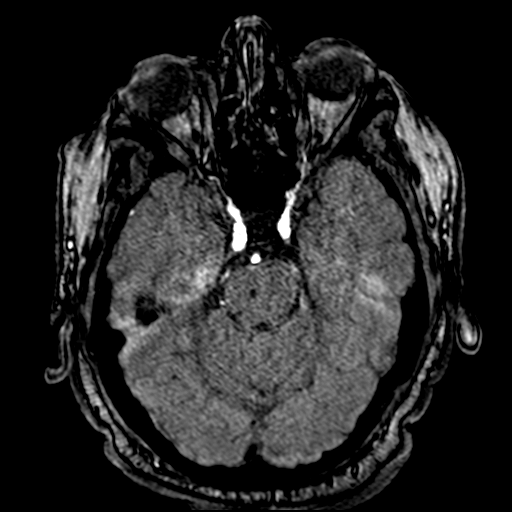
[im 96/188]
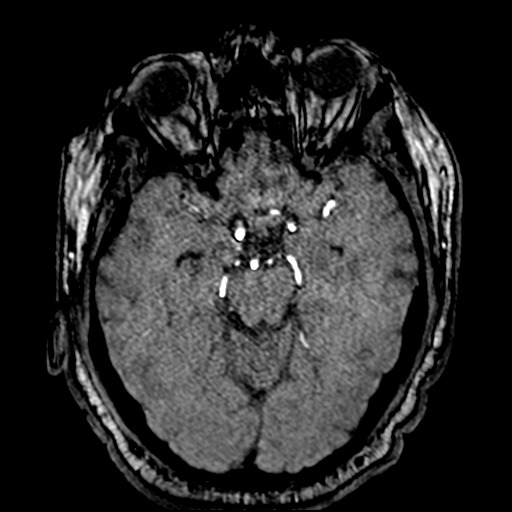
[im 108/188]
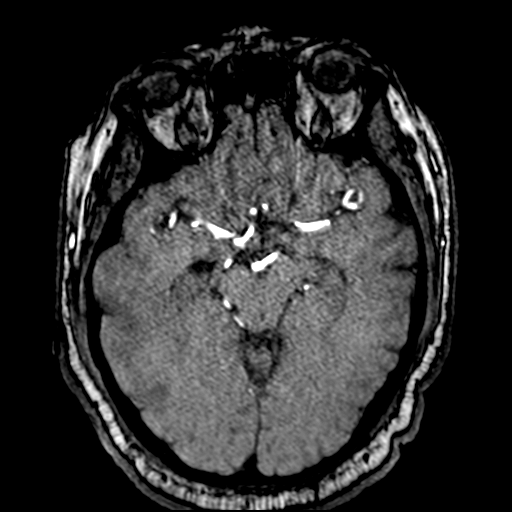
[im 132/188]
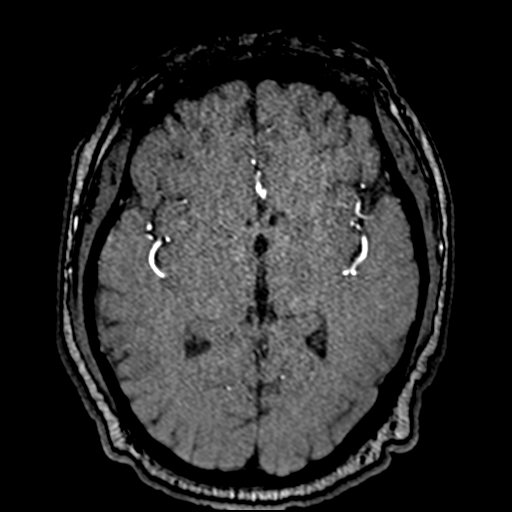
[im 156/188]
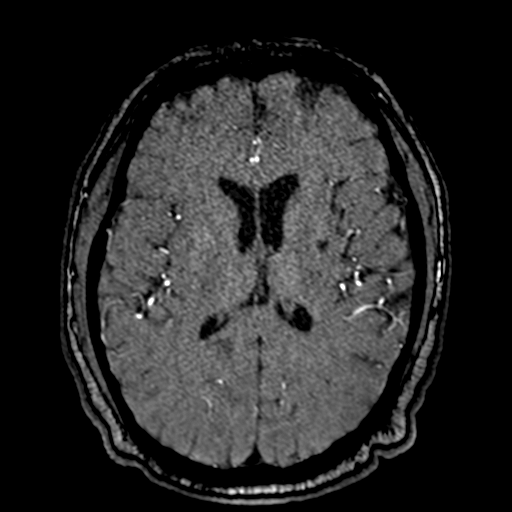
[im 160/188]
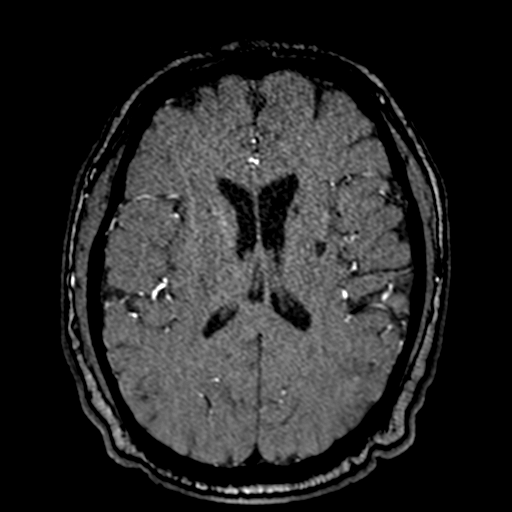
[im 180/188]
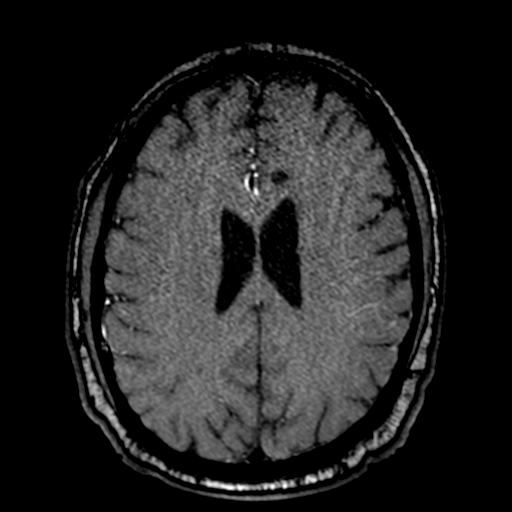

[16 of 48 positions shown; findings below may reference images not displayed]

FINDINGS: MRI HEAD FINDINGS

Brain: Cerebral volume within normal limits. Patchy T2/FLAIR
hyperintensity involving the periventricular and deep white matter
both cerebral hemispheres as well as the pons, most likely related
to chronic microvascular ischemic disease. Multiple scattered
superimposed remote lacunar infarcts noted about the corpus
callosum, periventricular white matter, deep gray nuclei, and pons.

9 mm focus of restricted diffusion seen involving the medial right
thalamus (series 5, image 81). Additional 1.2 cm focus of restricted
diffusion involving the inferior medial left cerebellum (series 5,
image 63). Findings consistent with acute ischemic infarcts.
Additional 2.1 cm focus of mild diffusion abnormality involving the
right splenium (series 5, image 8). Mild patchy diffusion
abnormality noted at the right basal ganglia/internal capsule
(series 5, image 83). Findings felt to be most consistent with
evolving subacute ischemic changes. Minimal petechial blood products
noted about the few of these areas of ischemia without frank
hemorrhagic transformation or significant mass effect.

No other evidence for acute or subacute ischemia. Gray-white matter
differentiation otherwise maintained. No areas of chronic cortical
infarction. No acute intracranial hemorrhage. Few scattered chronic
micro hemorrhages noted, most pronounced at the pons, most likely
hypertensive in nature.

No mass lesion, midline shift or mass effect. No hydrocephalus or
extra-axial fluid collection. Pituitary gland suprasellar region
normal.

Vascular: Major intracranial vascular flow voids are maintained.

Skull and upper cervical spine: Craniocervical junction within
normal limits. Bone marrow signal intensity diffusely decreased on
T1 weighted sequence, nonspecific, but most commonly related to
anemia, smoking or obesity. No focal marrow replacing lesion. No
scalp soft tissue abnormality.

Sinuses/Orbits: Right gaze noted. Globes orbital soft tissues
demonstrate no other acute finding. Scattered mucosal thickening
noted throughout the ethmoidal air cells. Paranasal sinuses are
otherwise largely clear. No significant mastoid effusion.

Other: None.

MRA HEAD FINDINGS

Anterior circulation: Both internal carotid arteries patent to the
termini without significant stenosis. Mild atheromatous irregularity
noted throughout the carotid siphons. A1 segments patent
bilaterally. Left A1 hypoplastic. Normal anterior communicating
artery complex. Anterior cerebral arteries patent without
significant stenosis. No M1 stenosis or occlusion. Normal MCA
bifurcations. No proximal MCA branch occlusion. Distal small vessel
atheromatous irregularity seen throughout the MCA branches
bilaterally.

Posterior circulation: Both vertebral arteries patent to the
vertebrobasilar junction without stenosis. Right vertebral artery
slightly dominant. Both PICA patent. Basilar patent to its distal
aspect without stenosis. Superior cerebellar and posterior cerebral
arteries patent bilaterally. Distal small atheromatous changes noted
within the distal PCA branches bilaterally.

Anatomic variants: None significant.  No aneurysm.

MRA NECK FINDINGS

Aortic arch: Examination mildly degraded by motion artifact.

Visualized aortic arch normal caliber with normal 3 vessel
morphology. No stenosis seen about the origin the great vessels.

Right carotid system: Right common and internal carotid arteries
patent without stenosis or evidence for dissection. No significant
atheromatous change or narrowing about the right carotid bulb.

Left carotid system: Left common and internal carotid arteries
patent without stenosis or evidence for dissection. No significant
atheromatous change or irregularity about the left carotid bulb.

Vertebral arteries: Both vertebral arteries arise from subclavian
arteries. No proximal subclavian artery stenosis. Right vertebral
artery slightly dominant. Vertebral arteries widely patent without
stenosis or evidence for dissection.

Other: None
IMPRESSION: MRI HEAD IMPRESSION:

1. Patchy small volume acute to early subacute ischemic infarcts
involving the right thalamus, right basal ganglia, right splenium,
and left cerebellum as above.
2. Underlying cerebral white matter disease with multiple probable
superimposed remote lacunar infarcts as above. Overall appearance of
the brain is favored to reflect that of chronic microvascular
ischemic disease with multiple remote lacunar infarcts. Possible
demyelinating disease could conceivably have this appearance as
well, and could be considered in the correct clinical setting.

MRA HEAD IMPRESSION:

1. Negative intracranial MRA for large vessel occlusion.
2. Distal small vessel atheromatous irregularity. No hemodynamically
significant or correctable stenosis.

MRA NECK IMPRESSION:

Wide patency of both carotid artery systems and vertebral arteries
within the neck.

## 2022-01-25 IMAGING — CT CT HEAD W/O CM
3 of 4 series · 14 of 47 positions shown, 16 images · non-contrast
Comparison: Head CT [DATE]

CLINICAL DATA: Altered mental status, double vision



[Series 3: head 5.0 h30s · axial · 0.44mm/px · z∈[-175,-25]mm · 8 of 36 slices shown, 10 images]
[im 3/36  brain]
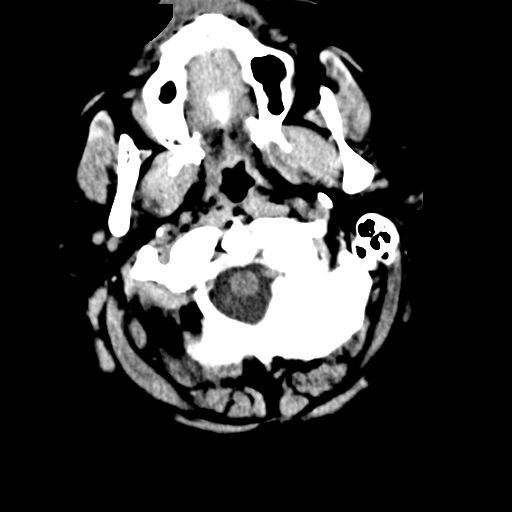
[im 3/36  bone]
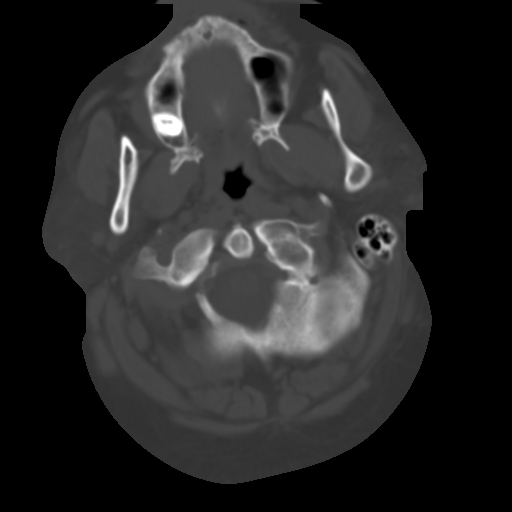
[im 8/36  brain]
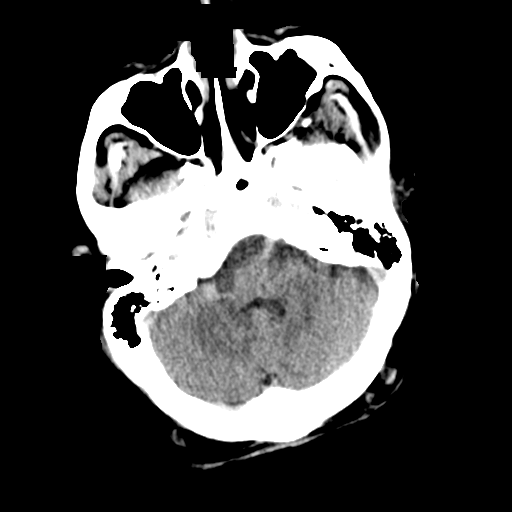
[im 13/36  brain]
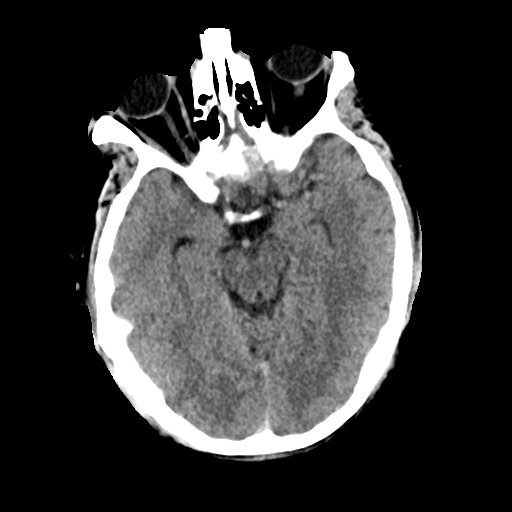
[im 16/36  brain]
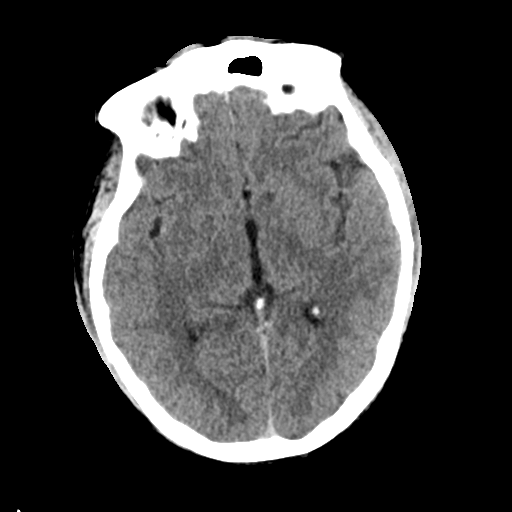
[im 21/36  brain]
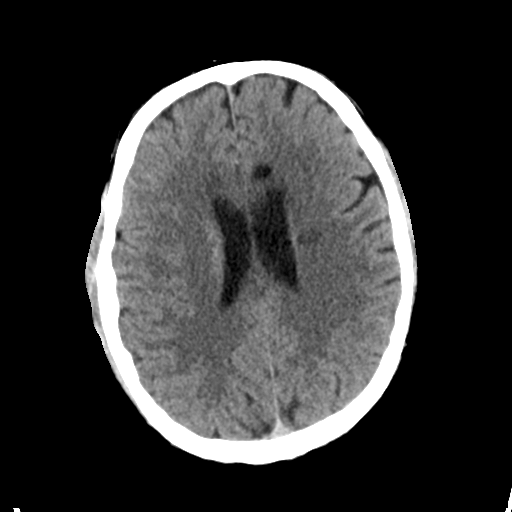
[im 21/36  bone]
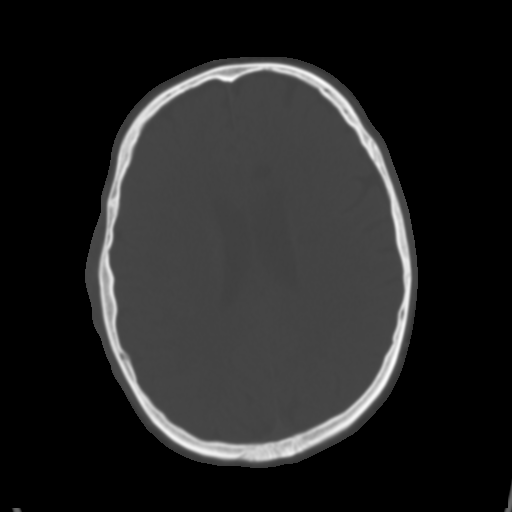
[im 23/36  brain]
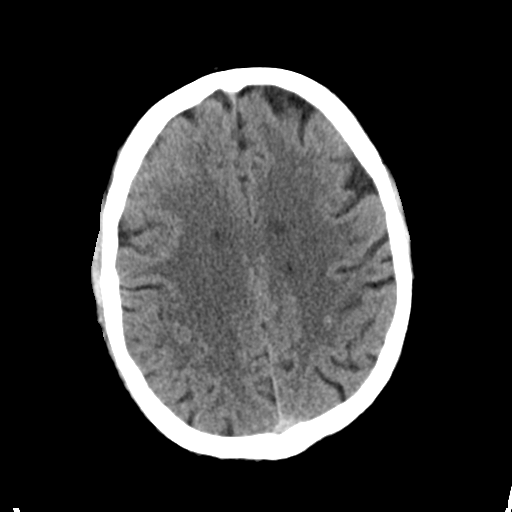
[im 28/36  brain]
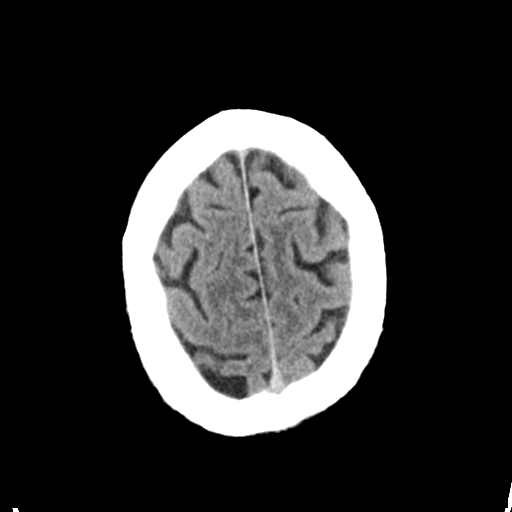
[im 33/36  brain]
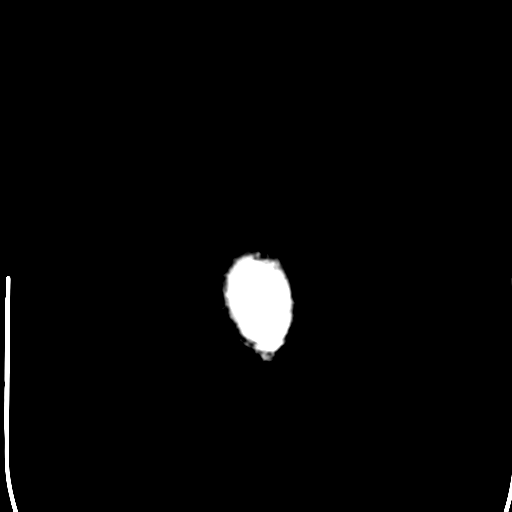

[Series 5: head 3.0 mpr cor · coronal · 0.34mm/px · 3 of 73 slices shown]
[im 25/73  brain]
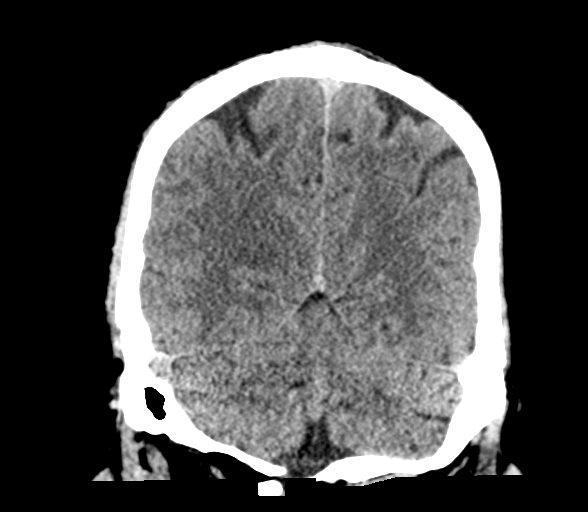
[im 33/73  brain]
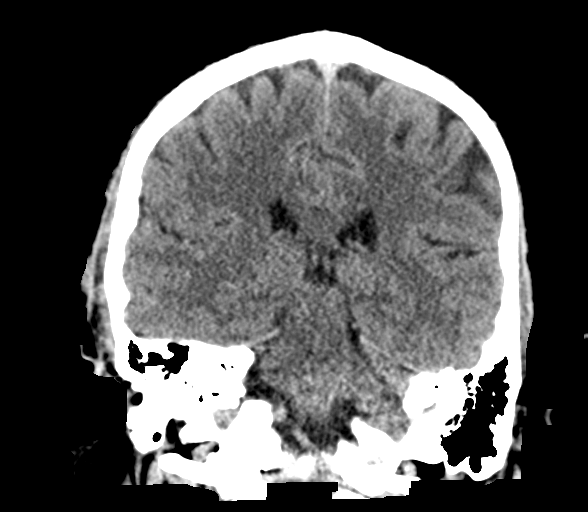
[im 41/73  brain]
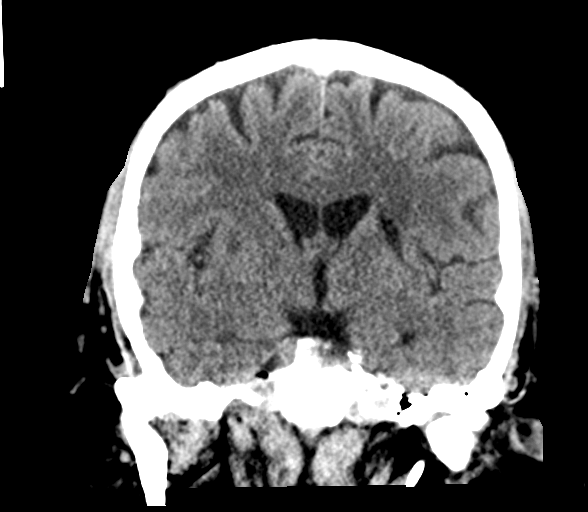

[Series 6: head 3.0 mpr sag · sagittal · 0.35mm/px · 3 of 67 slices shown]
[im 23/67  brain]
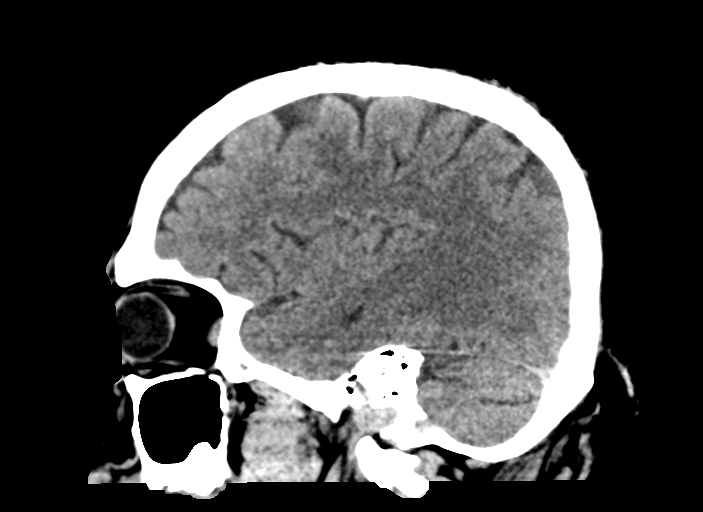
[im 34/67  brain]
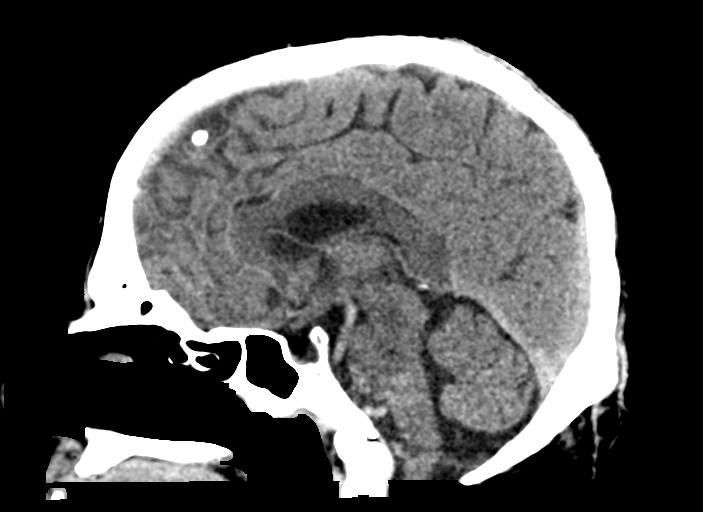
[im 45/67  brain]
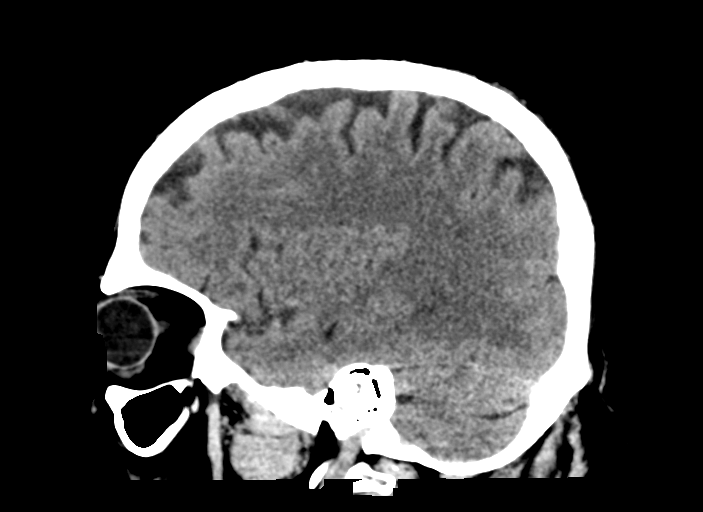

[14 of 47 positions shown; findings below may reference images not displayed]

FINDINGS: Brain: There are multiple foci of hypodensity in the bilateral
frontal lobe periventricular white matter, left caudate head, left
lentiform nucleus, and right splenium of the corpus callosum. These
are consistent with multiple lacunar infarcts, favored remote but
new since [03].

There is no definite evidence of acute infarct. There is no evidence
of acute intracranial hemorrhage or extra-axial fluid collection.

Background parenchymal volume is normal. The ventricles are normal
in size.

There is no mass lesion.  There is no mass effect or midline shift.

Vascular: No hyperdense vessel or unexpected calcification.

Skull: Normal. Negative for fracture or focal lesion.

Sinuses/Orbits: There is mild mucosal thickening in the paranasal
sinuses. The globes and orbits are unremarkable.

Other: None.
IMPRESSION: 1. Multiple lacunar infarcts in the bilateral cerebral hemispheres
as above, favored remote but new since [03]. Consider MRI to
evaluate for acute infarct as indicated.
2. No acute intracranial hemorrhage or extra-axial fluid collection.

## 2022-01-25 MED ORDER — MECLIZINE HCL 25 MG PO TABS
25.0000 mg | ORAL_TABLET | Freq: Once | ORAL | Status: AC
Start: 2022-01-25 — End: 2022-01-25
  Administered 2022-01-25: 25 mg via ORAL
  Filled 2022-01-25: qty 1

## 2022-01-25 MED ORDER — ASPIRIN 325 MG PO TABS
325.0000 mg | ORAL_TABLET | Freq: Every day | ORAL | Status: DC
  Filled 2022-01-25: qty 1

## 2022-01-25 MED ORDER — LABETALOL HCL 5 MG/ML IV SOLN: 20.0000 mg | Freq: Once | INTRAVENOUS | Status: DC

## 2022-01-25 MED ORDER — TRAZODONE HCL 50 MG PO TABS
25.0000 mg | ORAL_TABLET | Freq: Every evening | ORAL | Status: DC | PRN
  Administered 2022-01-26 (×2): 25 mg via ORAL
  Filled 2022-01-25 (×2): qty 1

## 2022-01-25 MED ORDER — ACETAMINOPHEN 650 MG RE SUPP: 650.0000 mg | RECTAL | Status: DC | PRN

## 2022-01-25 MED ORDER — ASPIRIN 300 MG RE SUPP: 300.0000 mg | Freq: Every day | RECTAL | Status: DC

## 2022-01-25 MED ORDER — ENOXAPARIN SODIUM 40 MG/0.4ML IJ SOSY
40.0000 mg | PREFILLED_SYRINGE | INTRAMUSCULAR | Status: DC
  Administered 2022-01-25 – 2022-01-26 (×2): 40 mg via SUBCUTANEOUS
  Filled 2022-01-25 (×2): qty 0.4

## 2022-01-25 MED ORDER — STROKE: EARLY STAGES OF RECOVERY BOOK: Freq: Once | Status: AC

## 2022-01-25 MED ORDER — ACETAMINOPHEN 325 MG PO TABS: 650.0000 mg | ORAL_TABLET | ORAL | Status: DC | PRN

## 2022-01-25 MED ORDER — ONDANSETRON HCL 4 MG/2ML IJ SOLN: 4.0000 mg | INTRAMUSCULAR | Status: DC | PRN

## 2022-01-25 MED ORDER — ASPIRIN 325 MG PO TABS
325.0000 mg | ORAL_TABLET | Freq: Every day | ORAL | Status: DC
  Administered 2022-01-25: 325 mg via ORAL
  Filled 2022-01-25: qty 1

## 2022-01-25 MED ORDER — LISINOPRIL 20 MG PO TABS
20.0000 mg | ORAL_TABLET | Freq: Every day | ORAL | Status: DC
  Administered 2022-01-25 – 2022-01-27 (×3): 20 mg via ORAL
  Filled 2022-01-25 (×3): qty 1

## 2022-01-25 MED ORDER — ACETAMINOPHEN 160 MG/5ML PO SOLN: 650.0000 mg | ORAL | Status: DC | PRN

## 2022-01-25 MED ORDER — ASPIRIN 325 MG PO TABS: 325.0000 mg | ORAL_TABLET | Freq: Every day | ORAL | Status: DC

## 2022-01-25 MED ORDER — SODIUM CHLORIDE 0.9 % IV SOLN: INTRAVENOUS | Status: DC

## 2022-01-25 MED ORDER — GADOBUTROL 1 MMOL/ML IV SOLN
10.0000 mL | Freq: Once | INTRAVENOUS | Status: AC | PRN
  Administered 2022-01-25: 10 mL via INTRAVENOUS

## 2022-01-25 MED ORDER — PANTOPRAZOLE SODIUM 40 MG PO TBEC
40.0000 mg | DELAYED_RELEASE_TABLET | Freq: Every day | ORAL | Status: DC
  Administered 2022-01-25 – 2022-01-27 (×3): 40 mg via ORAL
  Filled 2022-01-25 (×3): qty 1

## 2022-01-25 MED ORDER — SENNOSIDES-DOCUSATE SODIUM 8.6-50 MG PO TABS: 1.0000 | ORAL_TABLET | Freq: Every evening | ORAL | Status: DC | PRN

## 2022-01-25 NOTE — Assessment & Plan Note (Signed)
-   We will continue PPI therapy 

## 2022-01-25 NOTE — Assessment & Plan Note (Signed)
Management as above °

## 2022-01-25 NOTE — ED Triage Notes (Signed)
Pt. Stated, I woke up around 500 and my vision was messed up like everything going around. I laid back down to see if it would go away and did not so I came here. Ive not taken the BP meds in a month. ?

## 2022-01-25 NOTE — Assessment & Plan Note (Signed)
-   This could be related to noncompliance. ?- We will resume his lisinopril and allow permissive hypertension. ?- He will be placed on as needed IV labetalol. ?

## 2022-01-25 NOTE — ED Provider Notes (Signed)
[facility] EMERGENCY DEPARTMENT Provider Note   CSN: [id number] Arrival date & time: 01/25/22  1031     History  Chief Complaint  Patient presents with   Eye Problem    Timothy Pratt is a 46 y.o. male.   Eye Problem    46 year old presenting with diplopia and nausea. Last normal 11 pm last night. Woke up at 5am with diplopia and associated nausea. Feels that his BP is out of control. No facial droop, no numbness, weakness, no difficulty swallowing or speaking. Denies headache. Endorses binocular double vision on leftward gaze, occasionally on rightward gaze.  Home Medications Prior to Admission medications   Medication Sig Start Date End Date Taking? Authorizing Provider  Acetaminophen 500 MG capsule Take 500-1,000 mg by mouth daily as needed for pain (or headaches).   Yes [provider]  NEXIUM 24HR 20 MG capsule Take 20 mg by mouth daily before breakfast.   Yes [provider]  HYDROcodone-acetaminophen (NORCO/VICODIN) 5-325 MG tablet Take 2 tablets by mouth every 4 (four) hours as needed. Patient not taking: Reported on 01/25/2022 12/14/17   Eber Hong, MD  ibuprofen (ADVIL,MOTRIN) 800 MG tablet Take 1 tablet (800 mg total) by mouth 3 (three) times daily. Patient not taking: Reported on 01/25/2022 12/14/17   Eber Hong, MD  lisinopril (ZESTRIL) 20 MG tablet Take 1 tablet (20 mg total) by mouth daily for 30 days. Patient not taking: Reported on 01/25/2022 05/14/19 01/25/22  Elisha Ponder, PA-C      Allergies    Patient has no known allergies.    Review of Systems   Review of Systems  Eyes:  Positive for visual disturbance.  All other systems reviewed and are negative.  Physical Exam Updated Vital Signs BP (!) 198/99    Pulse 72    Temp 98.2 F (36.8 C) (Oral)    Resp 15    SpO2 95%  Physical Exam Vitals and nursing note reviewed.  Constitutional:      General: He is not in acute distress.    Appearance: He is  well-developed.  HENT:     Head: Normocephalic and atraumatic.  Eyes:     Conjunctiva/sclera: Conjunctivae normal.  Cardiovascular:     Rate and Rhythm: Normal rate and regular rhythm.     Heart sounds: No murmur heard. Pulmonary:     Effort: Pulmonary effort is normal. No respiratory distress.     Breath sounds: Normal breath sounds.  Abdominal:     Palpations: Abdomen is soft.     Tenderness: There is no abdominal tenderness.  Musculoskeletal:        General: No swelling.     Cervical back: Neck supple.  Skin:    General: Skin is warm and dry.     Capillary Refill: Capillary refill takes less than 2 seconds.  Neurological:     Mental Status: He is alert.     Comments: MENTAL STATUS EXAM:    Orientation: Alert and oriented to person, place and time.  Memory: Cooperative, follows commands well.  Language: Speech is clear and language is normal.   CRANIAL NERVES:    CN 2 (Optic): Visual fields intact to confrontation. No visual field deficit. CN 3,4,6 (EOM): Pupils equal and reactive to light. Full extraocular eye movement with nystagmus noted on leftward gaze. CN 5 (Trigeminal): Facial sensation is normal, no weakness of masticatory muscles.  CN 7 (Facial): No facial weakness or asymmetry.  CN 8 (Auditory): Auditory  acuity grossly normal.  CN 9,10 (Glossophar): The uvula is midline, the palate elevates symmetrically.  CN 11 (spinal access): Normal sternocleidomastoid and trapezius strength.  CN 12 (Hypoglossal): The tongue is midline. No atrophy or fasciculations.Marland Kitchen.   MOTOR:  Muscle Strength: 5/5RUE, 5/5LUE, 5/5RLE, 5/5LLE.   COORDINATION:   Intact finger-to-nose, no tremor.   SENSATION:   Intact to light touch all four extremities.  GAIT: Gait not assessed   Psychiatric:        Mood and Affect: Mood normal.    ED Results / Procedures / Treatments   Labs (all labs ordered are listed, but only abnormal results are displayed) Labs Reviewed  CBC WITH  DIFFERENTIAL/PLATELET - Abnormal; Notable for the following components:      Result Value   WBC 12.1 (*)    RBC 5.88 (*)    Hemoglobin 17.9 (*)    Neutro Abs 9.4 (*)    All other components within normal limits  BASIC METABOLIC PANEL - Abnormal; Notable for the following components:   Glucose, Bld 133 (*)    All other components within normal limits  RAPID URINE DRUG SCREEN, HOSP PERFORMED - Abnormal; Notable for the following components:   Opiates POSITIVE (*)    All other components within normal limits  URINALYSIS, ROUTINE W REFLEX MICROSCOPIC - Abnormal; Notable for the following components:   APPearance HAZY (*)    Hgb urine dipstick SMALL (*)    Bacteria, UA RARE (*)    All other components within normal limits  RESP PANEL BY RT-PCR (FLU A&B, COVID) ARPGX2  ETHANOL  PROTIME-INR  APTT  HEPATIC FUNCTION PANEL  I-STAT CHEM 8, ED    EKG EKG Interpretation  Date/Time:  Tuesday January 25 2022 11:04:00 EST Ventricular Rate:  84 PR Interval:  136 QRS Duration: 82 QT Interval:  378 QTC Calculation: 446 R Axis:   -9 Text Interpretation: Normal sinus rhythm ST & T wave abnormality, consider lateral ischemia Abnormal ECG When compared with ECG of 31-Mar-2014 14:33, new ST depression and T wave inversion lateral Confirmed by Meridee ScoreButler, Michael [phone]([id number]) on 01/25/2022 2:25:57 PM  Radiology CT HEAD WO CONTRAST  Result Date: 01/25/2022 CLINICAL DATA:  Altered mental status, double vision EXAM: CT HEAD WITHOUT CONTRAST TECHNIQUE: Contiguous axial images were obtained from the base of the skull through the vertex without intravenous contrast. RADIATION DOSE REDUCTION: This exam was performed according to the departmental dose-optimization program which includes automated exposure control, adjustment of the mA and/or kV according to patient size and/or use of iterative reconstruction technique. COMPARISON:  Head CT 05/03/2008 FINDINGS: Brain: There are multiple foci of hypodensity in the bilateral  frontal lobe periventricular white matter, left caudate head, left lentiform nucleus, and right splenium of the corpus callosum. These are consistent with multiple lacunar infarcts, favored remote but new since 2009. There is no definite evidence of acute infarct. There is no evidence of acute intracranial hemorrhage or extra-axial fluid collection. Background parenchymal volume is normal. The ventricles are normal in size. There is no mass lesion.  There is no mass effect or midline shift. Vascular: No hyperdense vessel or unexpected calcification. Skull: Normal. Negative for fracture or focal lesion. Sinuses/Orbits: There is mild mucosal thickening in the paranasal sinuses. The globes and orbits are unremarkable. Other: None. IMPRESSION: 1. 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 Consider MRI to evaluate for acute infarct as indicated. 2. No acute intracranial hemorrhage or extra-axial fluid collection. Electronically  Signed   By: Lesia Hausen M.D.   On: 01/25/2022 16:10   MR ANGIO HEAD WO CONTRAST  Result Date: 01/25/2022 CLINICAL DATA:  Initial evaluation for acute neuro deficit, stroke suspected. EXAM: MRI HEAD WITHOUT CONTRAST MRA HEAD WITHOUT CONTRAST MRA NECK WITHOUT AND WITH CONTRAST TECHNIQUE: Multiplanar, multi-echo pulse sequences of the brain and surrounding structures were acquired without intravenous contrast. Angiographic images of the Circle of Willis were acquired using MRA technique without intravenous contrast. Angiographic images of the neck were acquired using MRA technique without and with intravenous contrast. Carotid stenosis measurements (when applicable) are obtained utilizing NASCET criteria, using the distal internal carotid diameter as the denominator. CONTRAST:  10mL GADAVIST GADOBUTROL 1 MMOL/ML IV SOLN COMPARISON:  Prior head CT from earlier the same day. FINDINGS: MRI HEAD FINDINGS Brain: Cerebral volume within  normal limits. Patchy T2/FLAIR hyperintensity involving the periventricular and deep white matter both cerebral hemispheres as well as the pons, most likely related to chronic microvascular ischemic disease. Multiple scattered superimposed remote lacunar infarcts noted about the corpus callosum, periventricular white matter, deep gray nuclei, and pons. 9 mm focus of restricted diffusion seen involving the medial right thalamus (series 5, image 81). Additional 1.2 cm focus of restricted diffusion involving the inferior medial left cerebellum (series 5, image 63). Findings consistent with acute ischemic infarcts. Additional 2.1 cm focus of mild diffusion abnormality involving the right splenium (series 5, image 8). Mild patchy diffusion abnormality noted at the right basal ganglia/internal capsule (series 5, image 83). Findings felt to be most consistent with evolving subacute ischemic changes. Minimal petechial blood products noted about the few of these areas of ischemia without frank hemorrhagic transformation or significant mass effect. No other evidence for acute or subacute ischemia. Gray-white matter differentiation otherwise maintained. No areas of chronic cortical infarction. No acute intracranial hemorrhage. Few scattered chronic micro hemorrhages noted, most pronounced at the pons, most likely hypertensive in nature. No mass lesion, midline shift or mass effect. No hydrocephalus or extra-axial fluid collection. Pituitary gland suprasellar region normal. Vascular: Major intracranial vascular flow voids are maintained. Skull and upper cervical spine: Craniocervical junction within normal limits. Bone marrow signal intensity diffusely decreased on T1 weighted sequence, nonspecific, but most commonly related to anemia, smoking or obesity. No focal marrow replacing lesion. No scalp soft tissue abnormality. Sinuses/Orbits: Right gaze noted. Globes orbital soft tissues demonstrate no other acute finding. Scattered  mucosal thickening noted throughout the ethmoidal air cells. Paranasal sinuses are otherwise largely clear. No significant mastoid effusion. Other: None. MRA HEAD FINDINGS Anterior circulation: Both internal carotid arteries patent to the termini without significant stenosis. Mild atheromatous irregularity noted throughout the carotid siphons. A1 segments patent bilaterally. Left A1 hypoplastic. Normal anterior communicating artery complex. Anterior cerebral arteries patent without significant stenosis. No M1 stenosis or occlusion. Normal MCA bifurcations. No proximal MCA branch occlusion. Distal small vessel atheromatous irregularity seen throughout the MCA branches bilaterally. Posterior circulation: Both vertebral arteries patent to the vertebrobasilar junction without stenosis. Right vertebral artery slightly dominant. Both PICA patent. Basilar patent to its distal aspect without stenosis. Superior cerebellar and posterior cerebral arteries patent bilaterally. Distal small atheromatous changes noted within the distal PCA branches bilaterally. Anatomic variants: None significant.  No aneurysm. MRA NECK FINDINGS Aortic arch: Examination mildly degraded by motion artifact. Visualized aortic arch normal caliber with normal 3 vessel morphology. No stenosis seen about the origin the great vessels. Right carotid system: Right common and internal carotid arteries patent without stenosis or evidence for dissection.  No significant atheromatous change or narrowing about the right carotid bulb. Left carotid system: Left common and internal carotid arteries patent without stenosis or evidence for dissection. No significant atheromatous change or irregularity about the left carotid bulb. Vertebral arteries: Both vertebral arteries arise from subclavian arteries. No proximal subclavian artery stenosis. Right vertebral artery slightly dominant. Vertebral arteries widely patent without stenosis or evidence for dissection. Other:  None IMPRESSION: MRI HEAD IMPRESSION: 1. Patchy small volume acute to early subacute ischemic infarcts involving the right thalamus, right basal ganglia, right splenium, and left cerebellum as above. 2. Underlying cerebral white matter disease with multiple probable superimposed remote lacunar infarcts as above. 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 Possible demyelinating disease could conceivably have this appearance as well, and could be considered in the correct clinical setting. MRA HEAD IMPRESSION: 1. Negative intracranial MRA for large vessel occlusion. 2. Distal small vessel atheromatous irregularity. No hemodynamically significant or correctable stenosis. MRA NECK IMPRESSION: Wide patency of both carotid artery systems and vertebral arteries within the neck. Electronically Signed   By: Rise Mu M.D.   On: 01/25/2022 19:08   MR Angiogram Neck W or Wo Contrast  Result Date: 01/25/2022 CLINICAL DATA:  Initial evaluation for acute neuro deficit, stroke suspected. EXAM: MRI HEAD WITHOUT CONTRAST MRA HEAD WITHOUT CONTRAST MRA NECK WITHOUT AND WITH CONTRAST TECHNIQUE: Multiplanar, multi-echo pulse sequences of the brain and surrounding structures were acquired without intravenous contrast. Angiographic images of the Circle of Willis were acquired using MRA technique without intravenous contrast. Angiographic images of the neck were acquired using MRA technique without and with intravenous contrast. Carotid stenosis measurements (when applicable) are obtained utilizing NASCET criteria, using the distal internal carotid diameter as the denominator. CONTRAST:  3mL GADAVIST GADOBUTROL 1 MMOL/ML IV SOLN COMPARISON:  Prior head CT from earlier the same day. FINDINGS: MRI HEAD FINDINGS Brain: Cerebral volume within normal limits. Patchy T2/FLAIR hyperintensity involving the periventricular and deep white  matter both cerebral hemispheres as well as the pons, most likely related to chronic microvascular ischemic disease. Multiple scattered superimposed remote lacunar infarcts noted about the corpus callosum, periventricular white matter, deep gray nuclei, and pons. 9 mm focus of restricted diffusion seen involving the medial right thalamus (series 5, image 81). Additional 1.2 cm focus of restricted diffusion involving the inferior medial left cerebellum (series 5, image 63). Findings consistent with acute ischemic infarcts. Additional 2.1 cm focus of mild diffusion abnormality involving the right splenium (series 5, image 8). Mild patchy diffusion abnormality noted at the right basal ganglia/internal capsule (series 5, image 83). Findings felt to be most consistent with evolving subacute ischemic changes. Minimal petechial blood products noted about the few of these areas of ischemia without frank hemorrhagic transformation or significant mass effect. No other evidence for acute or subacute ischemia. Gray-white matter differentiation otherwise maintained. No areas of chronic cortical infarction. No acute intracranial hemorrhage. Few scattered chronic micro hemorrhages noted, most pronounced at the pons, most likely hypertensive in nature. No mass lesion, midline shift or mass effect. No hydrocephalus or extra-axial fluid collection. Pituitary gland suprasellar region normal. Vascular: Major intracranial vascular flow voids are maintained. Skull and upper cervical spine: Craniocervical junction within normal limits. Bone marrow signal intensity diffusely decreased on T1 weighted sequence, nonspecific, but most commonly related to anemia, smoking or obesity. No focal marrow replacing lesion. No scalp soft tissue abnormality. Sinuses/Orbits: Right gaze noted. Globes orbital soft tissues  demonstrate no other acute finding. Scattered mucosal thickening noted throughout the ethmoidal air cells. Paranasal sinuses are  otherwise largely clear. No significant mastoid effusion. Other: None. MRA HEAD FINDINGS Anterior circulation: Both internal carotid arteries patent to the termini without significant stenosis. Mild atheromatous irregularity noted throughout the carotid siphons. A1 segments patent bilaterally. Left A1 hypoplastic. Normal anterior communicating artery complex. Anterior cerebral arteries patent without significant stenosis. No M1 stenosis or occlusion. Normal MCA bifurcations. No proximal MCA branch occlusion. Distal small vessel atheromatous irregularity seen throughout the MCA branches bilaterally. Posterior circulation: Both vertebral arteries patent to the vertebrobasilar junction without stenosis. Right vertebral artery slightly dominant. Both PICA patent. Basilar patent to its distal aspect without stenosis. Superior cerebellar and posterior cerebral arteries patent bilaterally. Distal small atheromatous changes noted within the distal PCA branches bilaterally. Anatomic variants: None significant.  No aneurysm. MRA NECK FINDINGS Aortic arch: Examination mildly degraded by motion artifact. Visualized aortic arch normal caliber with normal 3 vessel morphology. No stenosis seen about the origin the great vessels. Right carotid system: Right common and internal carotid arteries patent without stenosis or evidence for dissection. No significant atheromatous change or narrowing about the right carotid bulb. Left carotid system: Left common and internal carotid arteries patent without stenosis or evidence for dissection. No significant atheromatous change or irregularity about the left carotid bulb. Vertebral arteries: Both vertebral arteries arise from subclavian arteries. No proximal subclavian artery stenosis. Right vertebral artery slightly dominant. Vertebral arteries widely patent without stenosis or evidence for dissection. Other: None IMPRESSION: MRI HEAD IMPRESSION: 1. Patchy small volume acute to early  subacute ischemic infarcts involving the right thalamus, right basal ganglia, right splenium, and left cerebellum as above. 2. Underlying cerebral white matter disease with multiple probable superimposed remote lacunar infarcts as above. 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 Possible demyelinating disease could conceivably have this appearance as well, and could be considered in the correct clinical setting. MRA HEAD IMPRESSION: 1. Negative intracranial MRA for large vessel occlusion. 2. Distal small vessel atheromatous irregularity. No hemodynamically significant or correctable stenosis. MRA NECK IMPRESSION: Wide patency of both carotid artery systems and vertebral arteries within the neck. Electronically Signed   By: Rise MuBenjamin  McClintock M.D.   On: 01/25/2022 19:08   MR BRAIN WO CONTRAST  Result Date: 01/25/2022 CLINICAL DATA:  Initial evaluation for acute neuro deficit, stroke suspected. EXAM: MRI HEAD WITHOUT CONTRAST MRA HEAD WITHOUT CONTRAST MRA NECK WITHOUT AND WITH CONTRAST TECHNIQUE: Multiplanar, multi-echo pulse sequences of the brain and surrounding structures were acquired without intravenous contrast. Angiographic images of the Circle of Willis were acquired using MRA technique without intravenous contrast. Angiographic images of the neck were acquired using MRA technique without and with intravenous contrast. Carotid stenosis measurements (when applicable) are obtained utilizing NASCET criteria, using the distal internal carotid diameter as the denominator. CONTRAST:  10mL GADAVIST GADOBUTROL 1 MMOL/ML IV SOLN COMPARISON:  Prior head CT from earlier the same day. FINDINGS: MRI HEAD FINDINGS Brain: Cerebral volume within normal limits. Patchy T2/FLAIR hyperintensity involving the periventricular and deep white matter both cerebral hemispheres as well as the pons, most likely related to chronic  microvascular ischemic disease. Multiple scattered superimposed remote lacunar infarcts noted about the corpus callosum, periventricular white matter, deep gray nuclei, and pons. 9 mm focus of restricted diffusion seen involving the medial right thalamus (series 5, image 81). Additional 1.2 cm focus of restricted diffusion involving the  inferior medial left cerebellum (series 5, image 63). Findings consistent with acute ischemic infarcts. Additional 2.1 cm focus of mild diffusion abnormality involving the right splenium (series 5, image 8). Mild patchy diffusion abnormality noted at the right basal ganglia/internal capsule (series 5, image 83). Findings felt to be most consistent with evolving subacute ischemic changes. Minimal petechial blood products noted about the few of these areas of ischemia without frank hemorrhagic transformation or significant mass effect. No other evidence for acute or subacute ischemia. Gray-white matter differentiation otherwise maintained. No areas of chronic cortical infarction. No acute intracranial hemorrhage. Few scattered chronic micro hemorrhages noted, most pronounced at the pons, most likely hypertensive in nature. No mass lesion, midline shift or mass effect. No hydrocephalus or extra-axial fluid collection. Pituitary gland suprasellar region normal. Vascular: Major intracranial vascular flow voids are maintained. Skull and upper cervical spine: Craniocervical junction within normal limits. Bone marrow signal intensity diffusely decreased on T1 weighted sequence, nonspecific, but most commonly related to anemia, smoking or obesity. No focal marrow replacing lesion. No scalp soft tissue abnormality. Sinuses/Orbits: Right gaze noted. Globes orbital soft tissues demonstrate no other acute finding. Scattered mucosal thickening noted throughout the ethmoidal air cells. Paranasal sinuses are otherwise largely clear. No significant mastoid effusion. Other: None. MRA HEAD FINDINGS  Anterior circulation: Both internal carotid arteries patent to the termini without significant stenosis. Mild atheromatous irregularity noted throughout the carotid siphons. A1 segments patent bilaterally. Left A1 hypoplastic. Normal anterior communicating artery complex. Anterior cerebral arteries patent without significant stenosis. No M1 stenosis or occlusion. Normal MCA bifurcations. No proximal MCA branch occlusion. Distal small vessel atheromatous irregularity seen throughout the MCA branches bilaterally. Posterior circulation: Both vertebral arteries patent to the vertebrobasilar junction without stenosis. Right vertebral artery slightly dominant. Both PICA patent. Basilar patent to its distal aspect without stenosis. Superior cerebellar and posterior cerebral arteries patent bilaterally. Distal small atheromatous changes noted within the distal PCA branches bilaterally. Anatomic variants: None significant.  No aneurysm. MRA NECK FINDINGS Aortic arch: Examination mildly degraded by motion artifact. Visualized aortic arch normal caliber with normal 3 vessel morphology. No stenosis seen about the origin the great vessels. Right carotid system: Right common and internal carotid arteries patent without stenosis or evidence for dissection. No significant atheromatous change or narrowing about the right carotid bulb. Left carotid system: Left common and internal carotid arteries patent without stenosis or evidence for dissection. No significant atheromatous change or irregularity about the left carotid bulb. Vertebral arteries: Both vertebral arteries arise from subclavian arteries. No proximal subclavian artery stenosis. Right vertebral artery slightly dominant. Vertebral arteries widely patent without stenosis or evidence for dissection. Other: None IMPRESSION: MRI HEAD IMPRESSION: 1. Patchy small volume acute to early subacute ischemic infarcts involving the right thalamus, right basal ganglia, right splenium,  and left cerebellum as above. 2. Underlying cerebral white matter disease with multiple probable superimposed remote lacunar infarcts as above. 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 Possible demyelinating disease could conceivably have this appearance as well, and could be considered in the correct clinical setting. MRA HEAD IMPRESSION: 1. Negative intracranial MRA for large vessel occlusion. 2. Distal small vessel atheromatous irregularity. No hemodynamically significant or correctable stenosis. MRA NECK IMPRESSION: Wide patency of both carotid artery systems and vertebral arteries within the neck. Electronically Signed   By: Rise Mu M.D.   On: 01/25/2022 19:08    Procedures Procedures    Medications Ordered  in ED Medications  aspirin tablet 325 mg (325 mg Oral Given 01/25/22 2015)  meclizine (ANTIVERT) tablet 25 mg (25 mg Oral Given 01/25/22 1601)  gadobutrol (GADAVIST) 1 MMOL/ML injection 10 mL (10 mLs Intravenous Contrast Given 01/25/22 1831)    ED Course/ Medical Decision Making/ A&P                           Medical Decision Making Amount and/or Complexity of Data Reviewed Labs: ordered. Radiology: ordered.  Risk OTC drugs. Prescription drug management. Decision regarding hospitalization.   46 year old presenting with diplopia and nausea. Last normal 11 pm last night. Woke up at 5am with diplopia and associated nausea. Feels that his BP is out of control. No facial droop, no numbness, weakness, no difficulty swallowing or speaking. Denies headache. Endorses binocular double vision on leftward gaze, occasionally on rightward gaze.  On arrival, the patient was afebrile, hypertensive BP 219/114, subsequent proved to 151/89 without intervention, saturating between 93 and 97% on room air.  Sinus rhythm noted on cardiac telemetry.  The patient presents with binocular diplopia, no  visual field deficit, noted primarily on physical exam on leftward lateral gaze with associated nystagmus present.  Patient outside the window for code stroke activation with a last known normal of 11 PM last night.  We will evaluate further with CT imaging of the head and screening laboratory work-up.  We will plan for neurology consultation.  CT head concerning for multiple lacunar infarcts in the bilateral cerebral hemispheres as above, favor remote but new since 2009, consider MRI to evaluate for acute infarct, no acute intracranial hemorrhage.  Laboratory work-up significant for hemoconcentration with leukocytosis to 12.1, hemoglobin of 17.9, BMP unremarkable, urinalysis negative for UTI, hepatic function panel normal, ethanol level normal, UDS positive for opiates, otherwise negative.  MRI brain with patchy small volume acute to early subacute ischemic infarcts involving the right thalamus, right basal ganglia, right splenium, left cerebellum as above, additional multiple probable superimposed remote lacunar infarcts as well.  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  Possible demyelinating disease could conceivably have this appearance as well and could be considered in the correct clinical setting.  Dr. Thomasena Edis of neurology paged for further evaluation and recommendations. Patient was administered 325 mg of aspirin. Hospitalist medicine consulted for admission for further stroke work-up.   Final Clinical Impression(s) / ED Diagnoses Final diagnoses:  Cerebrovascular accident (CVA), unspecified mechanism [facility])    Rx / DC Orders ED Discharge Orders     None         Ernie Avena, MD 01/25/22 2130

## 2022-01-25 NOTE — Assessment & Plan Note (Addendum)
-   The patient has patchy small volume acute to early subacute ischemic infarcts involving the right thalamus, right basal ganglia, right splenium, ?and left cerebellum. ?-He will be admitted to an observation medical telemetry bed. ?- We will follow neurochecks every 4 hours for 24 hours. ?- PT and OT consults as well as ST consult to be obtained. ?- He will be placed enteric-coated baby aspirin and Plavix. ?- We will check fasting lipids and place him on statin therapy. ?- We will obtain a 2D echo with bubble study for further assessment for an embolic etiology. ?- Neurology consult to be obtained. ?- Dr. Thomasena Edis was notified about the patient.  ?

## 2022-01-25 NOTE — ED Notes (Signed)
Pt to MRI

## 2022-01-25 NOTE — ED Provider Triage Note (Signed)
Emergency Medicine Provider Triage Evaluation Note ? ?Timothy Pratt , a 46 y.o. male  was evaluated in triage.  Pt complains of double vision since this morning. Pt states he has been out of his blood pressure medication for about a month due to cost.  ? ?Review of Systems  ?Positive: Double vision, nausea ?Negative: Blurred vision, headache, difficulty speaking or swallowing, numbness, weakness, chest pain, headache ? ?Physical Exam  ?BP (!) 219/114   Pulse 82   Resp 18   SpO2 95%  ?Gen:   Awake, no distress   ?Resp:  Normal effort  ?MSK:   Moves extremities without difficulty  ?Other:  No slurred speech, no facial droop, no pronator drift ? ?Medical Decision Making  ?Medically screening exam initiated at 11:03 AM.  Appropriate orders placed.  JHALEN ELEY was informed that the remainder of the evaluation will be completed by another provider, this initial triage assessment does not replace that evaluation, and the importance of remaining in the ED until their evaluation is complete. ? ?BP noted to be 219/144 in triage ?  ?Shailene Demonbreun T, PA-C ?01/25/22 1104 ? ?

## 2022-01-25 NOTE — H&P (Signed)
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 posterior cerebral arteries patent bilaterally. Distal small atheromatous changes noted within the distal PCA branches bilaterally. Anatomic variants: None significant.  No aneurysm. MRA NECK FINDINGS Aortic arch: Examination mildly degraded by motion artifact. Visualized aortic arch normal caliber with normal 3 vessel morphology. No stenosis seen about the origin the great vessels. Right carotid system: Right common and internal carotid arteries patent without stenosis or evidence for dissection. No significant atheromatous change or narrowing about the right carotid bulb. Left carotid system: Left common and internal carotid arteries patent without stenosis or evidence for dissection. No significant atheromatous change or irregularity about the left carotid bulb. Vertebral arteries: Both vertebral arteries arise from subclavian arteries. No proximal subclavian artery stenosis. Right vertebral artery slightly dominant. Vertebral arteries widely patent without stenosis or evidence for dissection. Other: None IMPRESSION: MRI HEAD IMPRESSION: 1. Patchy small  volume acute to early subacute ischemic infarcts involving the right thalamus, right basal ganglia, right splenium, and left cerebellum as above. 2. Underlying cerebral white matter disease with multiple probable superimposed remote lacunar infarcts as above. 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 Possible demyelinating disease could conceivably have this appearance as well, and could be considered in the correct clinical setting. MRA HEAD IMPRESSION: 1. Negative intracranial MRA for large vessel occlusion. 2. Distal small vessel atheromatous irregularity. No hemodynamically significant or correctable stenosis. MRA NECK IMPRESSION: Wide patency of both carotid artery systems and vertebral arteries within the neck. Electronically Signed   By: Rise MuBenjamin  McClintock M.D.   On: 01/25/2022 19:08      IMPRESSION AND PLAN:  Assessment and Plan: * CVA (cerebral vascular accident) (HCC) - The patient has patchy small volume acute to early subacute ischemic infarcts involving the right thalamus, right basal ganglia, right splenium, and left cerebellum. -He will be admitted to an observation medical telemetry bed. - We will follow neurochecks every 4 hours for 24 hours. - PT and OT consults as well as ST consult to be obtained. - He will be placed enteric-coated baby aspirin and Plavix. - We will check fasting lipids and place him on statin therapy. - We will obtain a 2D echo with bubble study for further assessment for an embolic etiology. - Neurology consult to be obtained. - Dr. Thomasena Edisollins was notified about the patient.   Diplopia - Management as above.  Hypertensive urgency - This could be related to noncompliance. - We will resume his lisinopril and allow permissive hypertension. - He will be placed on as needed IV labetalol.  Esophageal reflux - We will continue PPI therapy.       DVT  prophylaxis: Lovenox. Advanced Care Planning:  Code Status: full code. Family Communication:  The plan of care was discussed in details with the patient (and family). I answered all questions. The patient agreed to proceed with the above mentioned plan. Further management will depend upon hospital course. Disposition Plan: Back to previous home environment Consults called: Neurology. All the records are reviewed and case discussed with ED provider.  Status is: Observation  I certify that at the time of admission, it is my clinical judgment that the patient will require  hospital care extending less than 2 midnights.                            Dispo: The patient is from: Home  Anticipated d/c is to: Home              Patient currently is not medically stable to d/c.              Difficult to place patient: No  Hannah Beat M.D on 01/25/2022 at 10:35 PM  [facility]   From 7 PM-7 AM, contact night-coverage [email or web address]  CC: Primary care physician; Pcp, No

## 2022-01-26 ENCOUNTER — Observation Stay (HOSPITAL_COMMUNITY): Payer: Self-pay

## 2022-01-26 DIAGNOSIS — I6389 Other cerebral infarction: Secondary | ICD-10-CM

## 2022-01-26 LAB — CBC
HCT: 49.3 % (ref 39.0–52.0)
Hemoglobin: 16.9 g/dL (ref 13.0–17.0)
MCH: 30.2 pg (ref 26.0–34.0)
MCHC: 34.3 g/dL (ref 30.0–36.0)
MCV: 88.2 fL (ref 80.0–100.0)
Platelets: 188 10*3/uL (ref 150–400)
RBC: 5.59 MIL/uL (ref 4.22–5.81)
RDW: 13.4 % (ref 11.5–15.5)
WBC: 11.8 10*3/uL — ABNORMAL HIGH (ref 4.0–10.5)
nRBC: 0 % (ref 0.0–0.2)

## 2022-01-26 LAB — LIPID PANEL
Cholesterol: 234 mg/dL — ABNORMAL HIGH (ref 0–200)
HDL: 37 mg/dL — ABNORMAL LOW (ref >40)
LDL Cholesterol: 159 mg/dL — ABNORMAL HIGH (ref 0–99)
Total CHOL/HDL Ratio: 6.3 RATIO
Triglycerides: 190 mg/dL — ABNORMAL HIGH (ref <150)
VLDL: 38 mg/dL (ref 0–40)

## 2022-01-26 LAB — CREATININE, SERUM
Creatinine, Ser: 1.02 mg/dL (ref 0.61–1.24)
GFR, Estimated: >60 mL/min (ref >60)

## 2022-01-26 LAB — HIV ANTIBODY (ROUTINE TESTING W REFLEX): HIV Screen 4th Generation wRfx: NONREACTIVE

## 2022-01-26 LAB — ECHOCARDIOGRAM COMPLETE
AV Peak grad: 6.7 mmHg
Ao pk vel: 1.29 m/s
Area-P 1/2: 4.6 cm2
Height: 71 in
S' Lateral: 3.51 cm
Weight: 4959.47 oz

## 2022-01-26 LAB — ANTITHROMBIN III: AntiThromb III Func: 89 % (ref 75–120)

## 2022-01-26 LAB — HEMOGLOBIN A1C
Hgb A1c MFr Bld: 4.8 % (ref 4.8–5.6)
Mean Plasma Glucose: 91.06 mg/dL

## 2022-01-26 IMAGING — MR MR HEAD W/ CM
2 series · 40 of 48 positions shown · IV contrast (g GAD)
Comparison: Noncontrast head MRI [DATE]

CLINICAL DATA: Vasculitis suspected, CNS.  Suspected demyelination.

EXAM:
MRI HEAD WITH CONTRAST
TECHNIQUE: Multiplanar, multiecho pulse sequences of the brain and surrounding
structures were obtained with intravenous contrast.
CONTRAST:  10mL GADAVIST GADOBUTROL 1 MMOL/ML IV SOLN

[Series 3: T1 · axial · 3.0mm · 0.94mm/px · z∈[-73,+80]mm · 30 of 52 slices shown (1 of 2)]
[im 1/52]
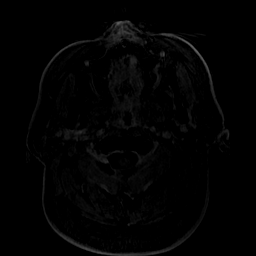
[im 2/52]
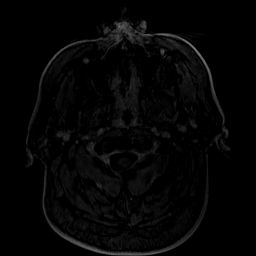
[im 4/52]
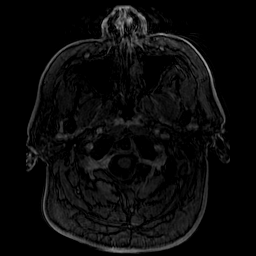
[im 6/52]
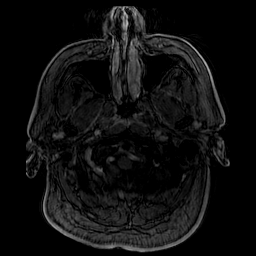
[im 8/52]
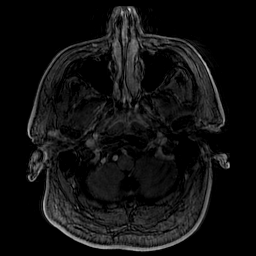
[im 9/52]
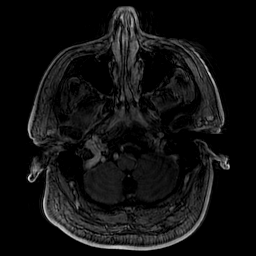
[im 11/52]
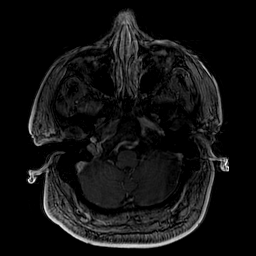
[im 13/52]
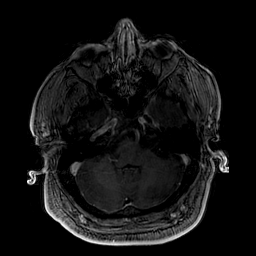
[im 15/52]
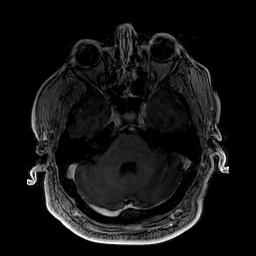
[im 16/52]
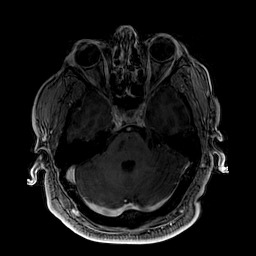
[im 18/52]
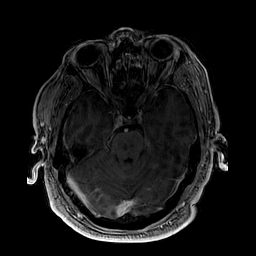
[im 20/52]
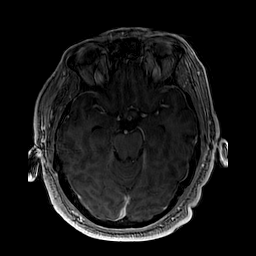
[im 22/52]
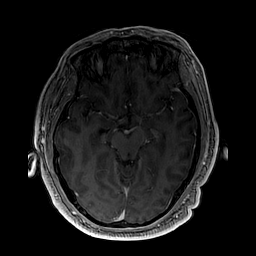
[im 23/52]
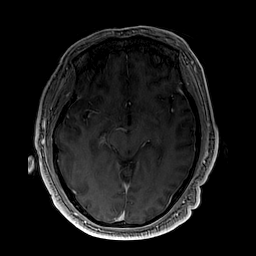
[im 25/52]
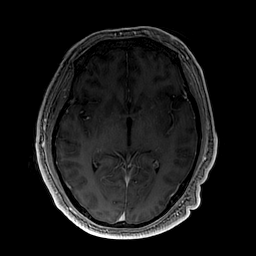
[im 27/52]
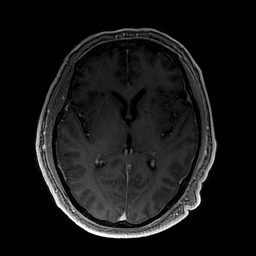
[im 29/52]
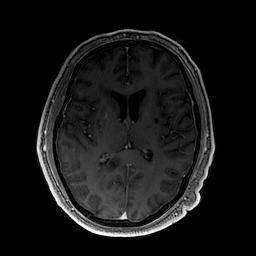
[im 30/52]
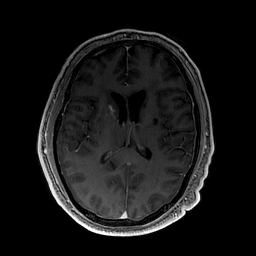
[im 32/52]
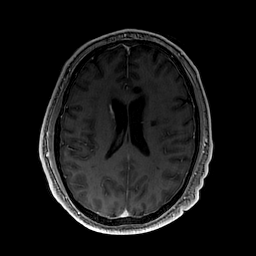
[im 34/52]
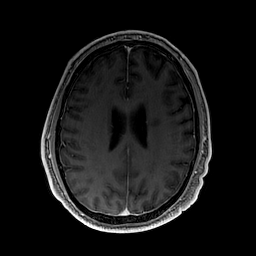
[im 36/52]
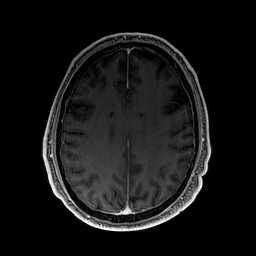
[im 37/52]
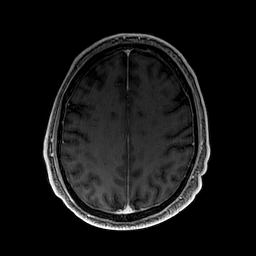
[im 39/52]
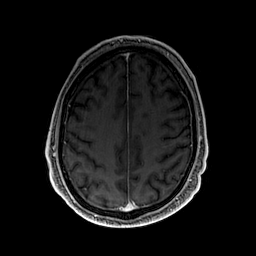
[im 41/52]
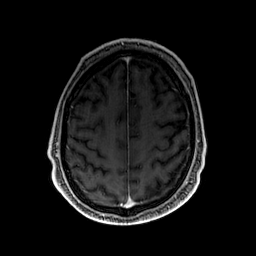
[im 43/52]
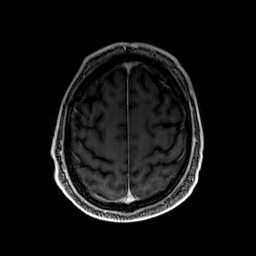
[im 44/52]
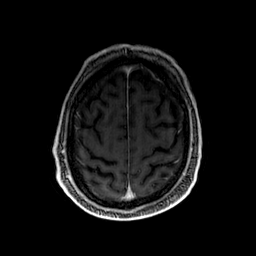
[im 46/52]
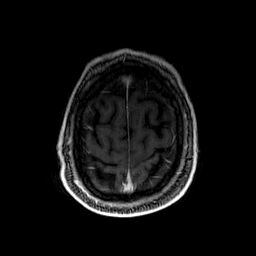
[im 48/52]
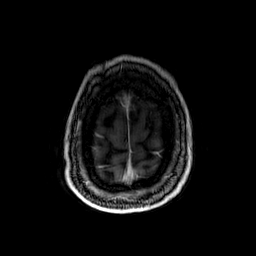
[im 50/52]
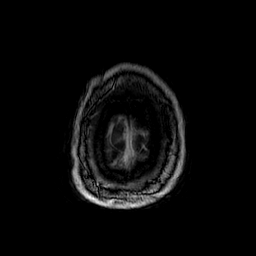
[im 52/52]
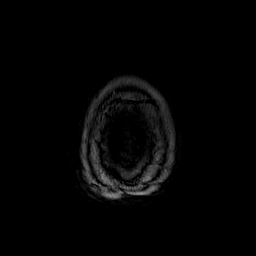

[Series 4: T1 · coronal · 5.0mm · 0.47mm/px · 10 of 30 slices shown (2 of 2)]
[im 2/30]
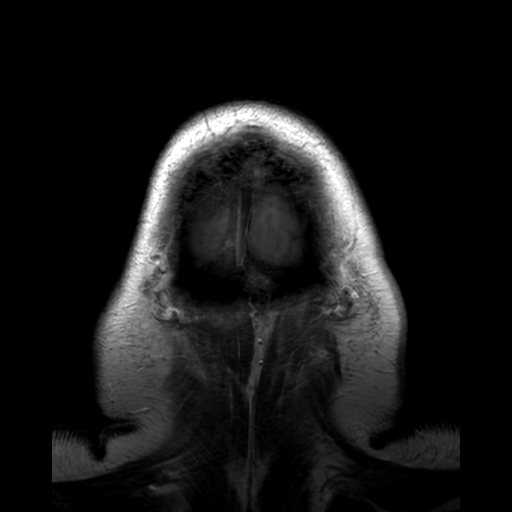
[im 6/30]
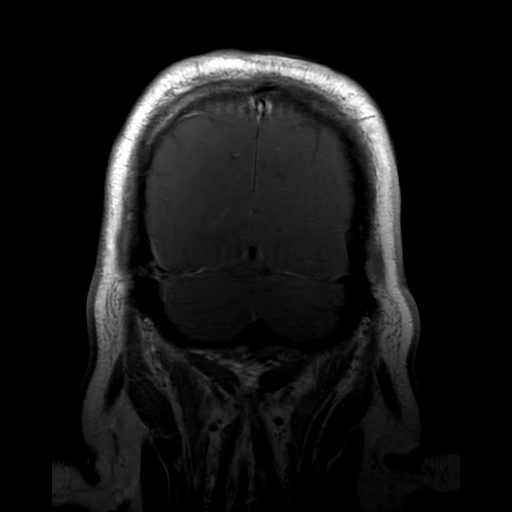
[im 9/30]
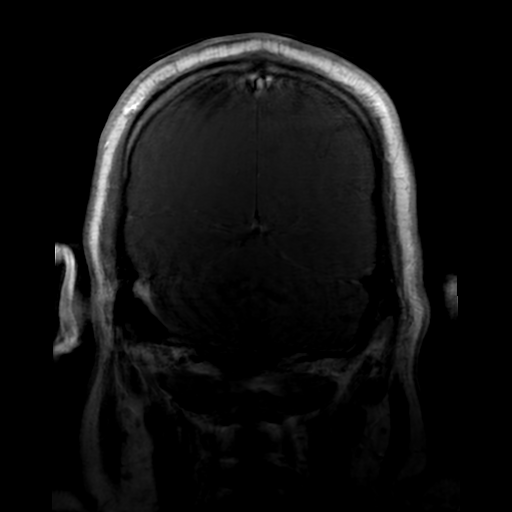
[im 12/30]
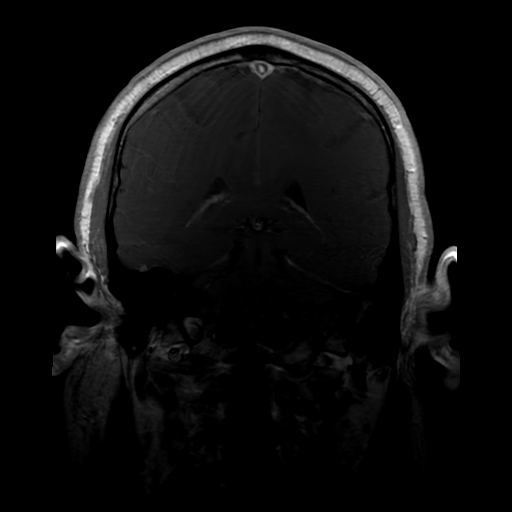
[im 16/30]
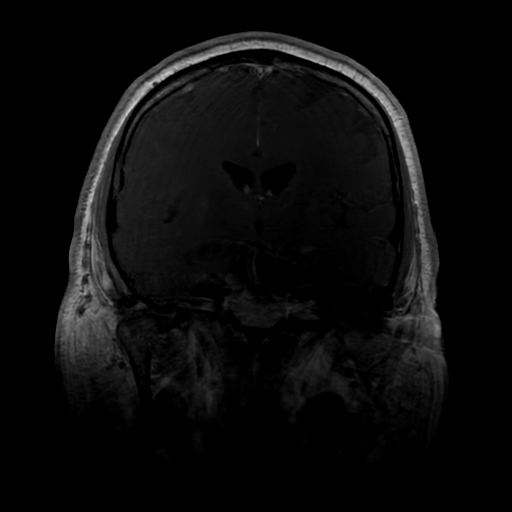
[im 18/30]
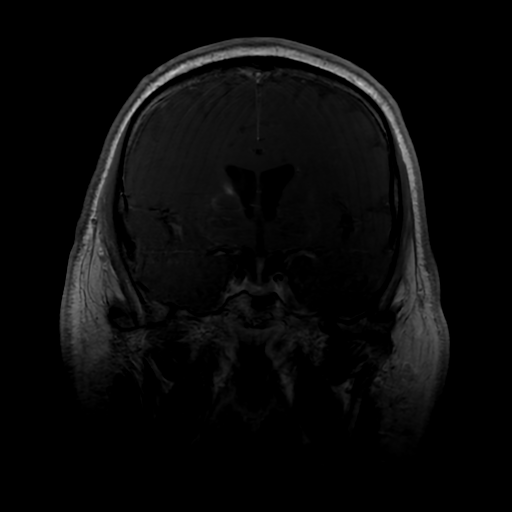
[im 21/30]
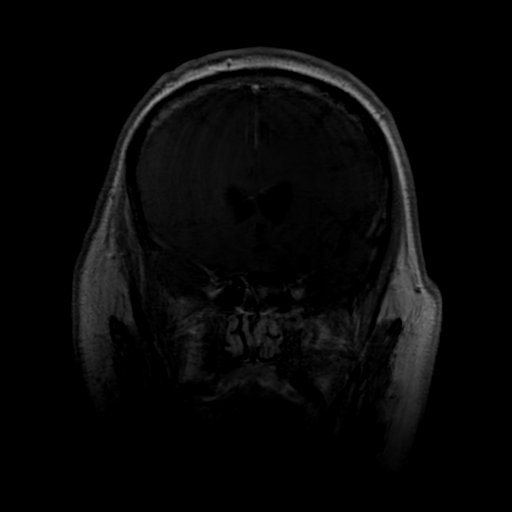
[im 24/30]
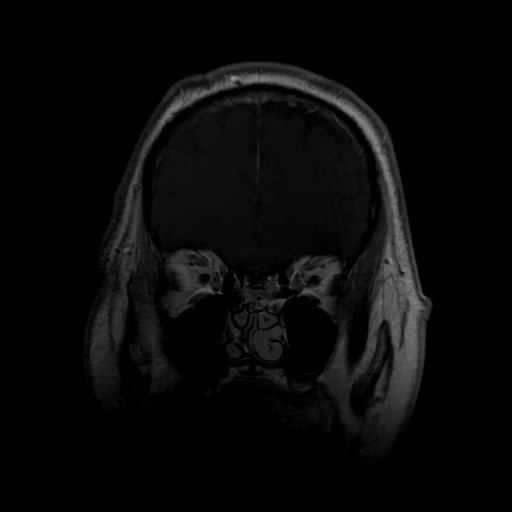
[im 26/30]
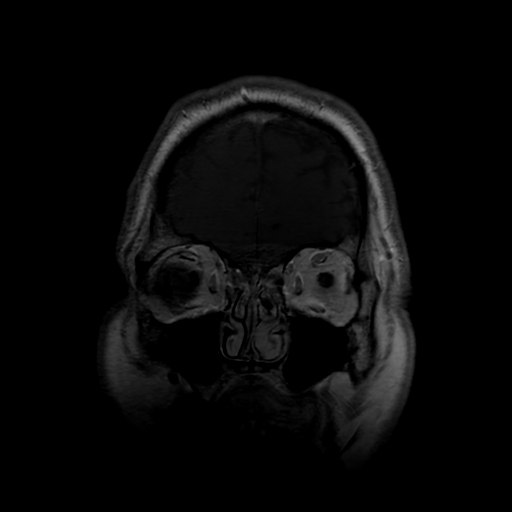
[im 28/30]
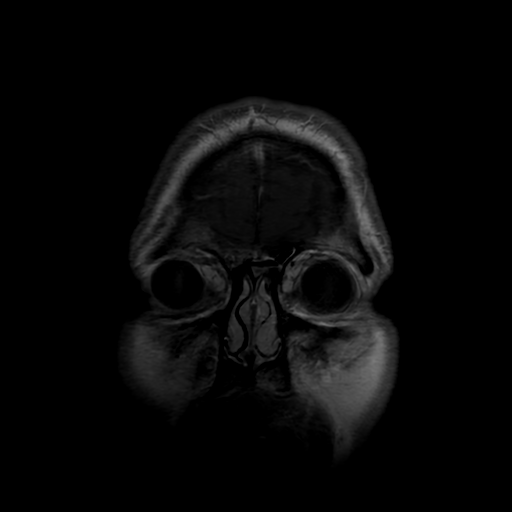

[40 of 48 positions shown; findings below may reference images not displayed]

FINDINGS: Axial and coronal T1 postcontrast sequences were performed with the
coronal sequence being mildly to moderately motion degraded.
Non-masslike enhancement involving the anterior body of the right
caudate nucleus corresponds to a region of diffusion and T2 signal
abnormality on yesterday's noncontrast MRI and is most compatible
with a subacute infarct. No abnormal intracranial enhancement is
identified elsewhere.
IMPRESSION: Enhancing subacute right basal ganglia infarct.

## 2022-01-26 IMAGING — MR MR CERVICAL SPINE WO/W CM
4 of 8 series · 19 of 48 positions shown · IV contrast (gadavist)
Comparison: Head MRI [DATE].

CLINICAL DATA: Demyelinating disease.

EXAM:
MRI CERVICAL SPINE WITHOUT AND WITH CONTRAST
TECHNIQUE: Multiplanar and multiecho pulse sequences of the cervical spine, to
include the craniocervical junction and cervicothoracic junction,
were obtained without and with intravenous contrast.
CONTRAST:  10mL GADAVIST GADOBUTROL 1 MMOL/ML IV SOLN

[Series 3: T2 · sagittal · 3.0mm · 0.43mm/px · 4 of 16 slices shown (1 of 2)]
[im 1/16]
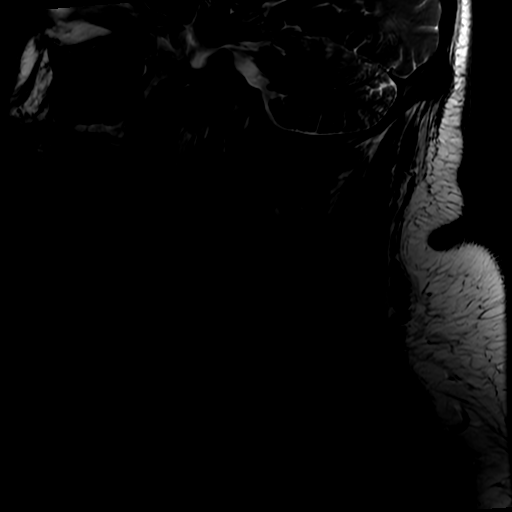
[im 6/16]
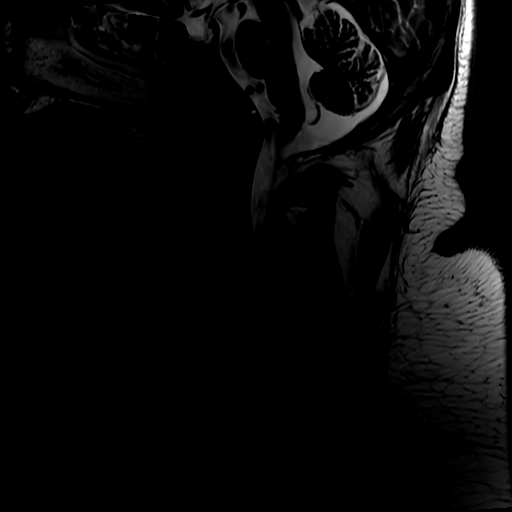
[im 11/16]
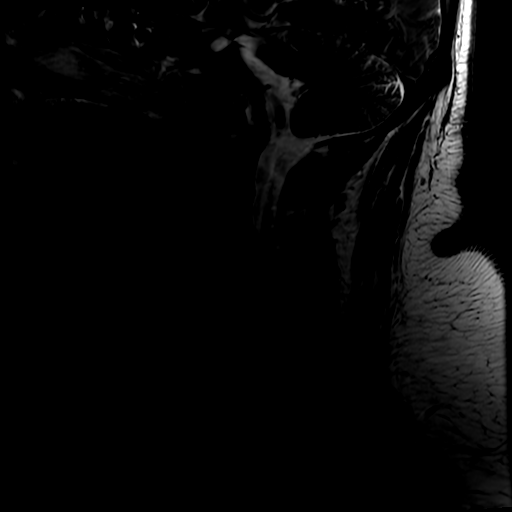
[im 16/16]
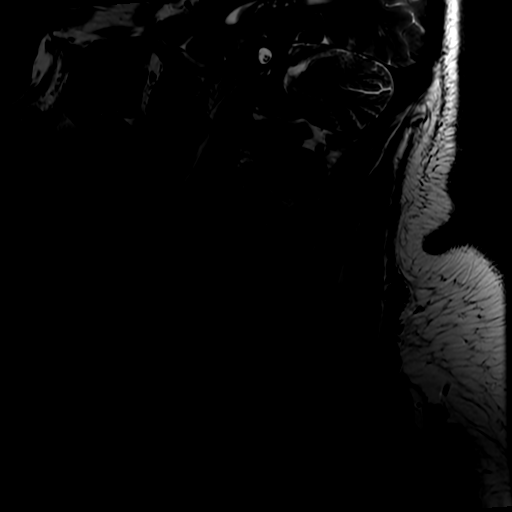

[Series 8: T2 · axial · 3.0mm · 0.35mm/px · z∈[-212,-107]mm · 6 of 32 slices shown (2 of 2)]
[im 1/32]
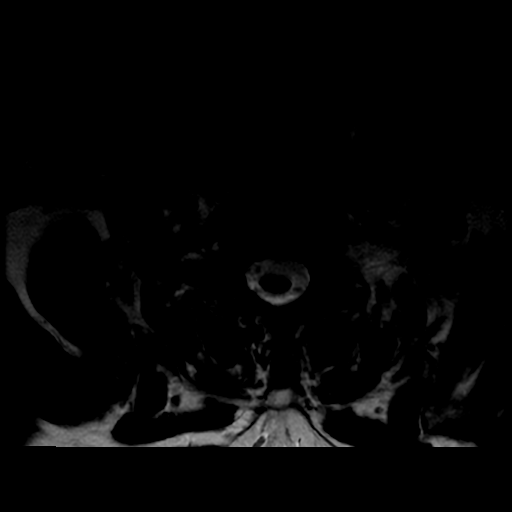
[im 7/32]
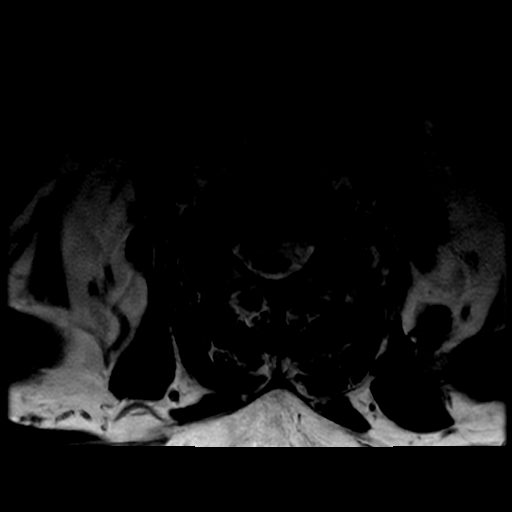
[im 13/32]
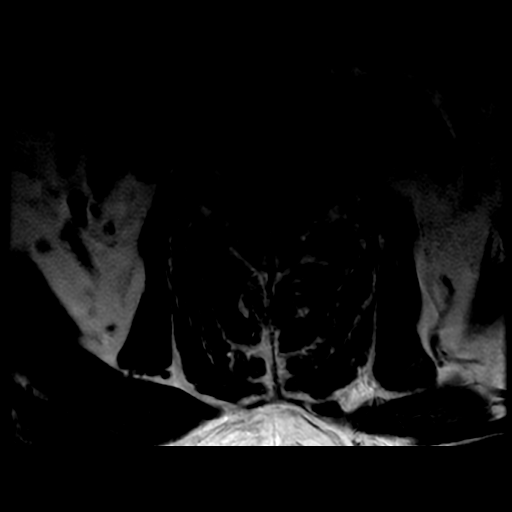
[im 19/32]
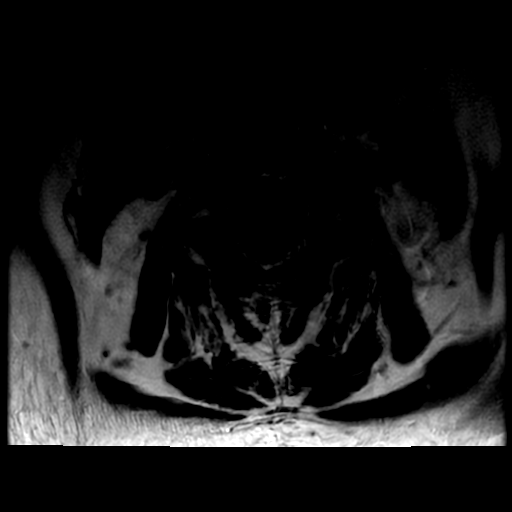
[im 25/32]
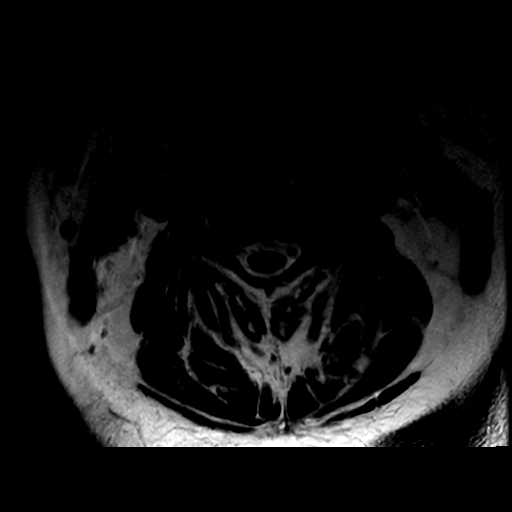
[im 32/32]
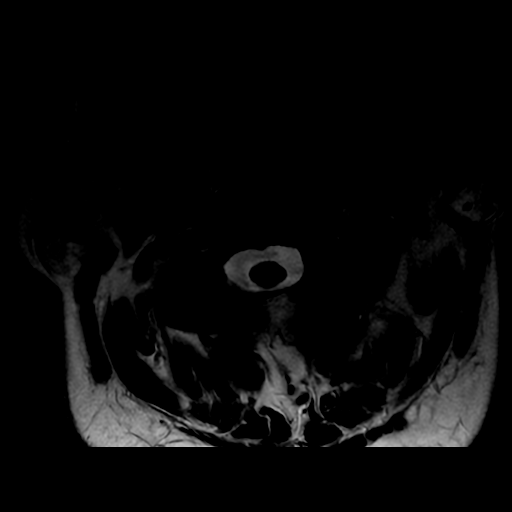

[Series 9: T1 · axial · non-contrast · 3.0mm · 0.35mm/px · z∈[-212,-107]mm · 6 of 31 slices shown]
[im 1/31]
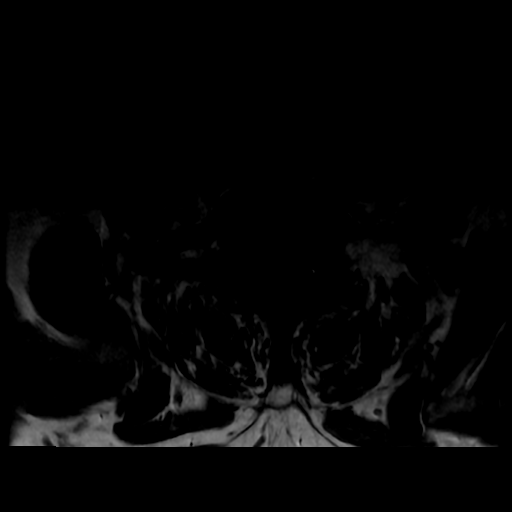
[im 7/31]
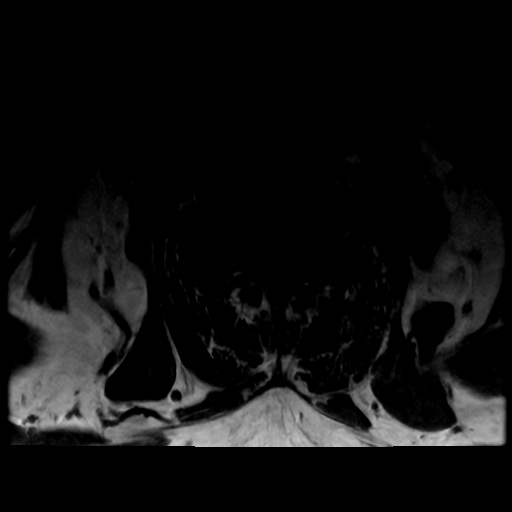
[im 13/31]
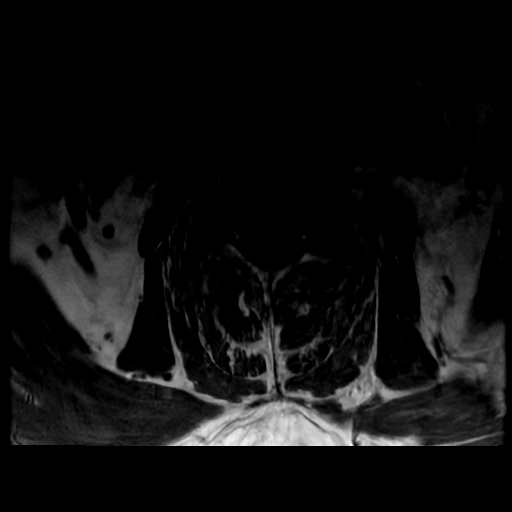
[im 19/31]
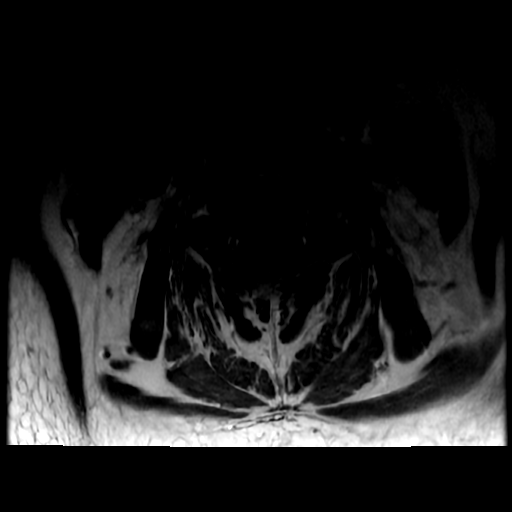
[im 25/31]
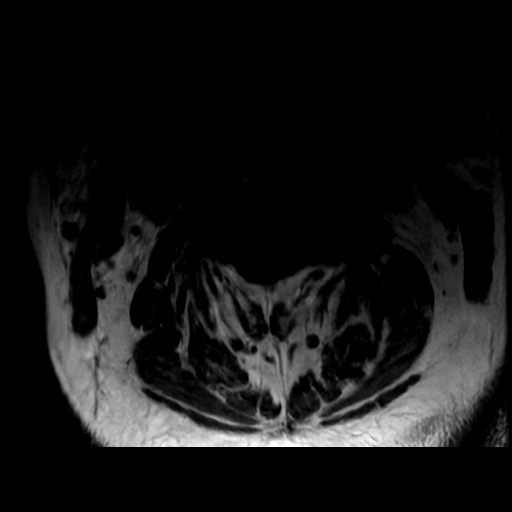
[im 31/31]
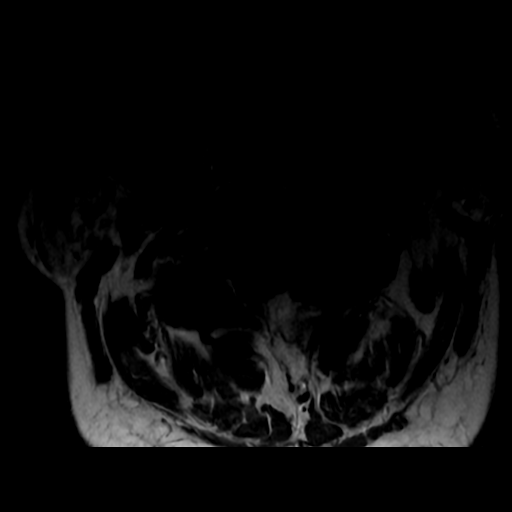

[Series 10: T1 fat-sat post-contrast · sagittal · 3.0mm · 0.43mm/px · 3 of 16 slices shown]
[im 1/16]
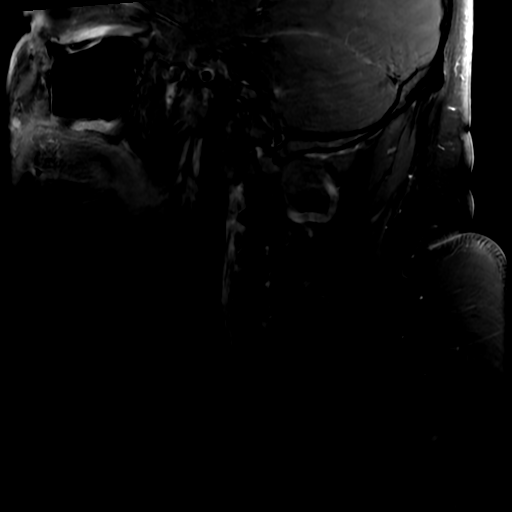
[im 8/16]
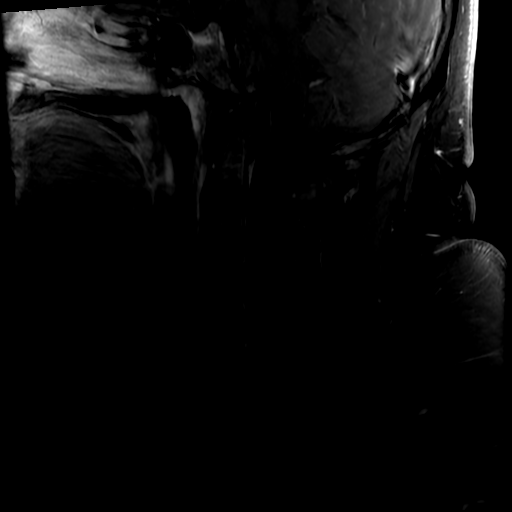
[im 16/16]
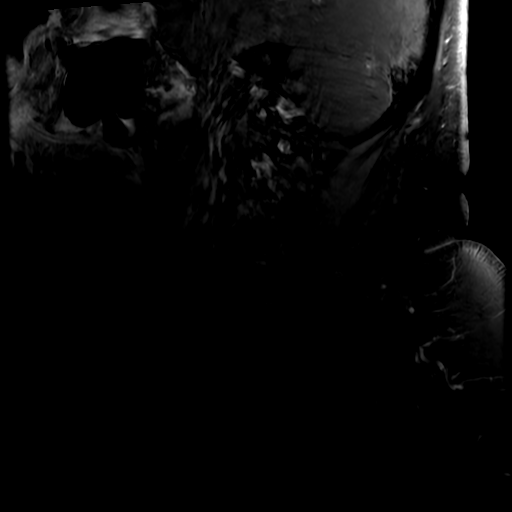

[19 of 48 positions shown; findings below may reference images not displayed]

FINDINGS: The study is motion degraded throughout, including moderate to
severe motion on multiple axial sequences.

Alignment: Normal.

Vertebrae: No fracture, suspicious marrow lesion, or significant
marrow edema.

Cord: No convincing cord signal abnormality or abnormal enhancement
identified within limitations of motion artifact.

Posterior Fossa, vertebral arteries, paraspinal tissues: Lacunar
infarcts in the pons as shown on the recent head MRI. Preserved
vertebral artery flow voids.

Disc levels:

C2-3: Mild uncovertebral spurring and mild facet arthrosis result in
at most mild right neural foraminal stenosis without spinal
stenosis.

C3-4: Disc bulging and uncovertebral spurring result in mild right
neural foraminal stenosis without spinal stenosis.

C4-5: Negative.

C5-6: Disc bulging, a left paracentral disc extrusion, and
uncovertebral spurring result in moderate spinal stenosis with mild
cord flattening and likely mild bilateral neural foraminal stenosis.

C6-7: A small right paracentral disc protrusion results in at most
mild spinal stenosis. Patent neural foramina.

C7-T1: Negative.
IMPRESSION: 1. Motion degraded examination. No evidence of demyelinating disease
in the cervical spinal cord.
2. Cervical spondylosis most notable at C5-6 where there is moderate
spinal stenosis.

## 2022-01-26 MED ORDER — PERFLUTREN LIPID MICROSPHERE
1.0000 mL | INTRAVENOUS | Status: AC | PRN
  Administered 2022-01-26: 4 mL via INTRAVENOUS
  Filled 2022-01-26: qty 10

## 2022-01-26 MED ORDER — ASPIRIN EC 81 MG PO TBEC
81.0000 mg | DELAYED_RELEASE_TABLET | Freq: Every day | ORAL | Status: DC
  Administered 2022-01-26: 81 mg via ORAL
  Filled 2022-01-26: qty 1

## 2022-01-26 MED ORDER — ATORVASTATIN CALCIUM 10 MG PO TABS
20.0000 mg | ORAL_TABLET | Freq: Every day | ORAL | Status: DC
  Administered 2022-01-26: 20 mg via ORAL
  Filled 2022-01-26: qty 2

## 2022-01-26 MED ORDER — GADOBUTROL 1 MMOL/ML IV SOLN
10.0000 mL | Freq: Once | INTRAVENOUS | Status: AC | PRN
  Administered 2022-01-26: 10 mL via INTRAVENOUS

## 2022-01-26 MED ORDER — ASPIRIN 325 MG PO TABS
325.0000 mg | ORAL_TABLET | Freq: Every day | ORAL | Status: DC
  Administered 2022-01-26 – 2022-01-27 (×2): 325 mg via ORAL
  Filled 2022-01-26 (×2): qty 1

## 2022-01-26 MED ORDER — ATORVASTATIN CALCIUM 80 MG PO TABS
80.0000 mg | ORAL_TABLET | Freq: Every day | ORAL | Status: DC
  Administered 2022-01-27: 10:00:00 80 mg via ORAL
  Filled 2022-01-26: qty 1

## 2022-01-26 MED ORDER — CLOPIDOGREL BISULFATE 75 MG PO TABS
75.0000 mg | ORAL_TABLET | Freq: Every day | ORAL | Status: DC
  Administered 2022-01-26 – 2022-01-27 (×2): 75 mg via ORAL
  Filled 2022-01-26 (×2): qty 1

## 2022-01-26 NOTE — Evaluation (Signed)
Occupational Therapy Evaluation ?Patient Details ?Name: Timothy Pratt ?MRN: [id number] ?DOB: Nov 27, 1975 ?Today's Date: 01/26/2022 ? ? ?History of Present Illness Timothy Pratt is a 46 y.o. Caucasian male who presented to the emergency room with acute onset of diplopia.  imaging revealed multiple lacunar inarct  PMH: essential hypertension, GERD and obstructive sleep apnea  ? ?Clinical Impression ?  ?Pt admitted for concerns listed above. 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 At this time, pt reports that his diplopia has been progressively clearing up all day and he feels much more balanced. He was able to complete functional mobility, transfers, uncluding into and out of a tub with no difficulties. Visually, pt appears back [facility], except mild diplopia/"wavering" when looking to the far left side. OT will follow pt acutely to monitor vision changes and ADL performance.   ?   ? ?Recommendations for follow up therapy are one component of a multi-disciplinary discharge planning process, led by the attending physician.  Recommendations may be updated based on patient status, additional functional criteria and insurance authorization.  ? ?Follow Up Recommendations ? No OT follow up  ?  ?Assistance Recommended at Discharge PRN  ?Patient can return home with the following   ? ?  ?Functional Status Assessment ? Patient has had a recent decline in their functional status and demonstrates the ability to make significant improvements in function in a reasonable and predictable amount of time.  ?Equipment Recommendations ? None recommended by OT  ?  ?Recommendations for Other Services   ? ? ?  ?Precautions / Restrictions Precautions ?Precautions: Fall ?Precaution Comments: biplopia ?Restrictions ?Weight Bearing Restrictions: No  ? ?  ? ?Mobility Bed Mobility ?Overal bed mobility: Modified Independent ?  ?  ?  ?  ?  ?  ?  ?  ? ?Transfers ?Overall  transfer level: Modified independent ?Equipment used: None ?Transfers: Sit to/from Stand ?Sit to Stand: Modified independent (Device/Increase time) ?  ?  ?  ?  ?  ?General transfer comment: No balance concerns this session ?  ? ?  ?Balance Overall balance assessment: No apparent balance deficits (not formally assessed) ?  ?  ?  ?  ?  ?  ?  ?  ?  ?  ?  ?  ?  ?  ?  ?  ?  ?  ?   ? ?ADL either performed or assessed with clinical judgement  ? ?ADL Overall ADL's : Modified independent ?  ?  ?  ?  ?  ?  ?  ?  ?  ?  ?  ?  ?  ?  ?  ?  ?  ?  ?  ?General ADL Comments: Overall pt appears back to his baseline, able to perform all ADL's and functional mobility with no difficulty. He completed a tub transfer and stairs this session, in addition to BADL's, and had no concerns with either.  ? ? ? ?Vision Baseline Vision/History: 1 Wears glasses ?Ability to See in Adequate Light: 0 Adequate ?Patient Visual Report: Diplopia ?Vision Assessment?: Yes ?Eye Alignment: Within Functional Limits ?Ocular Range of Motion: Within Functional Limits ?Alignment/Gaze Preference: Within Defined Limits ?Tracking/Visual Pursuits: Able to track stimulus in all quads without difficulty ?Saccades: Within functional limits ?Convergence: Within functional limits ?Visual Fields: No apparent deficits ?Diplopia Assessment: Only with left gaze ?Additional Comments: Pt has "wavering" in far Left gaze where he feels like he sees slightly double. Reports  yesterday he was seeing 2 of everything, however today it has progressed/cleared throughout the day.  ?   ?Perception   ?  ?Praxis   ?  ? ?Pertinent Vitals/Pain Pain Assessment ?Pain Assessment: No/denies pain  ? ? ? ?Hand Dominance Right ?  ?Extremity/Trunk Assessment Upper Extremity Assessment ?Upper Extremity Assessment: Overall WFL for tasks assessed ?  ?Lower Extremity Assessment ?Lower Extremity Assessment: Defer to PT evaluation ?  ?Cervical / Trunk Assessment ?Cervical / Trunk Assessment: Normal ?   ?Communication Communication ?Communication: No difficulties (slow to respond at times) ?  ?Cognition Arousal/Alertness: Awake/alert ?Behavior During Therapy: Flat affect ?Overall Cognitive Status: Within Functional Limits for tasks assessed ?  ?  ?  ?  ?  ?  ?  ?  ?  ?  ?  ?  ?  ?  ?  ?  ?General Comments: pt flat affect but responsive and follow all commands ?  ?  ?General Comments  Pt reporting that diplopia is clearing up significantly. ? ?  ?Exercises   ?  ?Shoulder Instructions    ? ? ?Home Living Family/patient expects to be discharged to:: Private residence ?Living Arrangements: Alone ?Available Help at Discharge: Family;Available 24 hours/day (plans to go home with parents upon d/c, sister and brother in law also live there) ?Type of Home: House ?Home Access: Stairs to enter ?Entrance Stairs-Number of Steps: 2 ?Entrance Stairs-Rails: None ?Home Layout: One level ?  ?  ?Bathroom Shower/Tub: Tub/shower unit ?  ?Bathroom Toilet: Standard ?  ?  ?Home Equipment: None ?  ?  ?  ? ?  ?Prior Functioning/Environment Prior Level of Function : Independent/Modified Independent;Working/employed;Driving ?  ?  ?  ?  ?  ?  ?  ?  ?  ? ?  ?  ?OT Problem List: Impaired balance (sitting and/or standing);Impaired vision/perception;Decreased safety awareness ?  ?   ?OT Treatment/Interventions:    ?  ?OT Goals(Current goals can be found in the care plan section) Acute Rehab OT Goals ?Patient Stated Goal: To go home ?OT Goal Formulation: With patient ?Time For Goal Achievement: 02/02/22 ?Potential to Achieve Goals: Good ?ADL Goals ?Additional ADL Goal #1: Pt will complete a pathfinding task with independence. ?Additional ADL Goal #2: Pt will complete a medication management task with no errors, independently.  ?OT Frequency:   ?  ? ?Co-evaluation   ?  ?  ?  ?  ? ?  ?AM-PAC OT "6 Clicks" Daily Activity     ?Outcome Measure Help from another person eating meals?: None ?Help from another person taking care of personal grooming?:  None ?Help from another person toileting, which includes using toliet, bedpan, or urinal?: None ?Help from another person bathing (including washing, rinsing, drying)?: None ?Help from another person to put on and taking off regular upper body clothing?: None ?Help from another person to put on and taking off regular lower body clothing?: None ?6 Click Score: 24 ?  ?End of Session Nurse Communication: Mobility status ? ?Activity Tolerance: Patient tolerated treatment well ?Patient left: in bed;with call bell/phone within reach ? ?OT Visit Diagnosis: Unsteadiness on feet (R26.81);Other abnormalities of gait and mobility (R26.89);Muscle weakness (generalized) (M62.81)  ?              ?Time: 6333-5456 ?OT Time Calculation (min): 14 min ?Charges:  OT General Charges ?$OT Visit: 1 Visit ?OT Evaluation ?$OT Eval Moderate Complexity: 1 Mod ? ?Timothy Mcdonald H., OTR/L ?Acute Rehabilitation ? ?Timothy Pratt Elane Bing Plume ?01/26/2022, 9:18 PM ?

## 2022-01-26 NOTE — Progress Notes (Signed)
Echocardiogram ?2D Echocardiogram has been performed. ? ?Eduard Roux ?01/26/2022, 1:50 PM ?

## 2022-01-26 NOTE — Anesthesia Preprocedure Evaluation (Addendum)
Anesthesia Evaluation  ?Patient identified by MRN, date of birth, ID band ?Patient awake ? ? ? ?Reviewed: ?Allergy & Precautions, NPO status , Patient's Chart, lab work & pertinent test results ? ?Airway ?Mallampati: II ? ?TM Distance: >3 FB ?Neck ROM: Full ? ? ? Dental ? ?(+) Edentulous Upper, Chipped, Dental Advisory Given,  ?  ?Pulmonary ?sleep apnea ,  ?  ?Pulmonary exam normal ?breath sounds clear to auscultation ? ? ? ? ? ? Cardiovascular ?hypertension, Pt. on medications ?Normal cardiovascular exam ?Rhythm:Regular Rate:Normal ? ?TTE 2023 ?1. Left ventricular ejection fraction, by estimation, is 55 to 60%. The  ?left ventricle has normal function. The left ventricle has no regional  ?wall motion abnormalities. There is mild concentric left ventricular  ?hypertrophy. Left ventricular diastolic  ?parameters are consistent with Grade II diastolic dysfunction  ?(pseudonormalization).  ??2. Right ventricular systolic function is normal. The right ventricular  ?size is normal. Tricuspid regurgitation signal is inadequate for assessing  ?PA pressure.  ??3. Left atrial size was mild to moderately dilated.  ??4. The mitral valve is grossly normal. Trivial mitral valve  ?regurgitation. No evidence of mitral stenosis.  ??5. The aortic valve was not well visualized. Aortic valve regurgitation  ?is not visualized. No aortic stenosis is present.  ??6. The inferior vena cava is dilated in size with <50% respiratory  ?variability, suggesting right atrial pressure of 15 mmHg ?  ?Neuro/Psych ?CVA (residual visual changes), Residual Symptoms negative psych ROS  ? GI/Hepatic ?Neg liver ROS, GERD  ,  ?Endo/Other  ?Morbid obesity (BMI 43) ? Renal/GU ?negative Renal ROS  ?negative genitourinary ?  ?Musculoskeletal ?negative musculoskeletal ROS ?(+)  ? Abdominal ?  ?Peds ? Hematology ?negative hematology ROS ?(+)   ?Anesthesia Other Findings ? ? Reproductive/Obstetrics ? ?   ? ? ? ? ? ? ? ? ? ? ? ? ? ?  ?  ? ? ? ? ? ? ? ?Anesthesia Physical ?Anesthesia Plan ? ?ASA: 3 ? ?Anesthesia Plan: MAC  ? ?Post-op Pain Management:   ? ?Induction: Intravenous ? ?PONV Risk Score and Plan: Propofol infusion and Treatment may vary due to age or medical condition ? ?Airway Management Planned: Natural Airway ? ?Additional Equipment:  ? ?Intra-op Plan:  ? ?Post-operative Plan:  ? ?Informed Consent: I have reviewed the patients History and Physical, chart, labs and discussed the procedure including the risks, benefits and alternatives for the proposed anesthesia with the patient or authorized representative who has indicated his/her understanding and acceptance.  ? ? ? ?Dental advisory given ? ?Plan Discussed with: CRNA ? ?Anesthesia Plan Comments:   ? ? ? ? ? ? ?Anesthesia Quick Evaluation ? ?

## 2022-01-26 NOTE — Evaluation (Signed)
Physical Therapy Evaluation ?Patient Details ?Name: Timothy Pratt ?MRN: [id number] ?DOB: Feb 07, 1976 ?Today's Date: 01/26/2022 ? ?History of Present Illness ? Timothy Pratt is a 46 y.o. Caucasian male who presented to the emergency room with acute onset of diplopia.  imaging revealed multiple lacunar inarct  PMH: essential hypertension, GERD and obstructive sleep apnea ?  ?Clinical Impression ? Pt admitted with above. Pt with noted impaired balance and increased fall risk with ambulation however suspect this to be due to diplopia. Alerted OT that occluded glasses may be beneficial. Pt reports he plans on staying with his parents upon d/c in which they can provide 24/7 supervision/assist if need. Recommend outpt PT to address balance deficits if not improved due to vision deficits. Acute PT to cont to follow.   ?   ? ?Recommendations for follow up therapy are one component of a multi-disciplinary discharge planning process, led by the attending physician.  Recommendations may be updated based on patient status, additional functional criteria and insurance authorization. ? ?Follow Up Recommendations Outpatient PT ? ?  ?Assistance Recommended at Discharge Frequent or constant Supervision/Assistance (initially, plans to go parents home upon d/c)  ?Patient can return home with the following ? A little help with walking and/or transfers;A little help with bathing/dressing/bathroom;Assist for transportation ? ?  ?Equipment Recommendations None recommended by PT  ?Recommendations for Other Services ?    ?  ?Functional Status Assessment Patient has had a recent decline in their functional status and demonstrates the ability to make significant improvements in function in a reasonable and predictable amount of time.  ? ?  ?Precautions / Restrictions Precautions ?Precautions: Fall ?Precaution Comments: biplopia ?Restrictions ?Weight Bearing Restrictions: No  ? ?  ? ?Mobility ? Bed Mobility ?Overal bed mobility: Modified  Independent ?  ?  ?  ?  ?  ?  ?  ?  ? ?Transfers ?Overall transfer level: Needs assistance ?Equipment used: None ?Transfers: Sit to/from Stand ?Sit to Stand: Min guard ?  ?  ?  ?  ?  ?General transfer comment: pt mildly unsteady, suspect due to diplopia ?  ? ?Ambulation/Gait ?Ambulation/Gait assistance: Min guard ?Gait Distance (Feet): 150 Feet ?Assistive device: None ?Gait Pattern/deviations: Step-through pattern, Decreased stride length, Staggering left, Staggering right, Wide base of support ?Gait velocity: dec ?  ?  ?General Gait Details: pt usnteady with lateral sway L/R suspect due to diplopia, had pt cover L eye with hand in which his stability with ambulation then improved, pt educated on slower movements to allow for eyes to compensate ? ?Stairs ?  ?  ?  ?  ?  ? ?Wheelchair Mobility ?  ? ?Modified Rankin (Stroke Patients Only) ?Modified Rankin (Stroke Patients Only) ?Pre-Morbid Rankin Score: No symptoms ?Modified Rankin: Moderate disability ? ?  ? ?Balance   ?  ?  ?  ?  ?  ?  ?  ?  ?  ?  ?  ?  ?  ?  ?  ?Standardized Balance Assessment ?Standardized Balance Assessment : Dynamic Gait Index ?  ?Dynamic Gait Index ?Level Surface: Mild Impairment ?Change in Gait Speed: Mild Impairment ?Gait with Horizontal Head Turns: Mild Impairment ?Gait with Vertical Head Turns: Mild Impairment ?Gait and Pivot Turn: Mild Impairment ?Step Over Obstacle: Mild Impairment ?Step Around Obstacles: Mild Impairment ?Steps: Mild Impairment ?Total Score: 16 ?   ? ? ? ?Pertinent Vitals/Pain Pain Assessment ?Pain Assessment: No/denies pain  ? ? ?Home Living Family/patient expects to be discharged to:: Private residence ?Living Arrangements:  Alone ?Available Help at Discharge: Family;Available 24 hours/day (plans to go home with parents upon d/c, sister and brother in law also live there) ?Type of Home: House ?Home Access: Stairs to enter ?Entrance Stairs-Rails: None ?Entrance Stairs-Number of Steps: 2 ?  ?Home Layout: One level ?Home  Equipment: None ?   ?  ?Prior Function Prior Level of Function : Independent/Modified Independent;Working/employed;Driving ?  ?  ?  ?  ?  ?  ?  ?  ?  ? ? ?Hand Dominance  ? Dominant Hand: Right ? ?  ?Extremity/Trunk Assessment  ? Upper Extremity Assessment ?Upper Extremity Assessment: Overall WFL for tasks assessed ?  ? ?Lower Extremity Assessment ?Lower Extremity Assessment: Overall WFL for tasks assessed ?  ? ?Cervical / Trunk Assessment ?Cervical / Trunk Assessment: Normal  ?Communication  ? Communication: No difficulties (slow to respond at times)  ?Cognition Arousal/Alertness: Awake/alert ?Behavior During Therapy: Flat affect ?Overall Cognitive Status: Within Functional Limits for tasks assessed ?  ?  ?  ?  ?  ?  ?  ?  ?  ?  ?  ?  ?  ?  ?  ?  ?General Comments: pt flat affect but responsive and follow all commands ?  ?  ? ?  ?General Comments General comments (skin integrity, edema, etc.): pt with noted diplopia ? ?  ?Exercises    ? ?Assessment/Plan  ?  ?PT Assessment Patient needs continued PT services  ?PT Problem List Decreased strength;Decreased activity tolerance;Decreased balance;Decreased mobility ? ?   ?  ?PT Treatment Interventions DME instruction;Gait training;Stair training;Functional mobility training;Therapeutic activities;Therapeutic exercise;Balance training;Neuromuscular re-education   ? ?PT Goals (Current goals can be found in the Care Plan section)  ?Acute Rehab PT Goals ?Patient Stated Goal: get ome ?PT Goal Formulation: With patient ?Time For Goal Achievement: 02/09/22 ?Potential to Achieve Goals: Good ? ?  ?Frequency Min 4X/week ?  ? ? ?Co-evaluation   ?  ?  ?  ?  ? ? ?  ?AM-PAC PT "6 Clicks" Mobility  ?Outcome Measure Help needed turning from your back to your side while in a flat bed without using bedrails?: None ?Help needed moving from lying on your back to sitting on the side of a flat bed without using bedrails?: None ?Help needed moving to and from a bed to a chair (including a  wheelchair)?: None ?Help needed standing up from a chair using your arms (e.g., wheelchair or bedside chair)?: A Little ?Help needed to walk in hospital room?: A Little ?Help needed climbing 3-5 steps with a railing? : A Little ?6 Click Score: 21 ? ?  ?End of Session Equipment Utilized During Treatment: Gait belt ?Activity Tolerance: Patient tolerated treatment well ?Patient left: in chair;with call bell/phone within reach;with chair alarm set ?Nurse Communication: Mobility status ?PT Visit Diagnosis: Unsteadiness on feet (R26.81);Difficulty in walking, not elsewhere classified (R26.2) ?  ? ?Time: [id number] ?PT Time Calculation (min) (ACUTE ONLY): 22 min ? ? ?Charges:   PT Evaluation ?$PT Eval Moderate Complexity: 1 Mod ?  ?  ?   ? ? ?Kittie Plater, PT, DPT ?Acute Rehabilitation Services ?Pager #: [phone] ?Office #: [phone] ? ? ?Shyler Holzman M Omar Orrego ?01/26/2022, 11:15 AM ? ?

## 2022-01-26 NOTE — Consult Note (Signed)
Neurology Consultation  Reason for Consult: Diplopia, stroke on MRI Referring Physician: Dr. Karleen Hampshire  CC: Double vision  History is obtained from: Patient, chart  HPI: Timothy Pratt is a 46 y.o. male past medical history of hypertension, sleep apnea, presented to the emergency room for evaluation of double vision.  He states that he went to bed sometime around the night of 01/24/2022 and woke up on 01/25/2022 with lightheaded and double vision and dizziness.  He said he saw double when he looked more towards the left.  His blood pressures were elevated.  Systolics were in the [id number] on arrival.  No tingling numbness weakness. Came into the emergency room for further evaluation because of persistent symptoms.  MRI of the brain was completed-see report below. Neurological consultation for stroke. No prior history of strokes No chest pain shortness of breath nausea vomiting No fevers chills. No preceding illnesses or sicknesses Smokes 1 pack of cigarettes per day.   LKW: The night of 01/24/2022 tpa given?: no, outside the window Premorbid modified Rankin scale (mRS): 0   ROS: Full ROS was performed and is negative except as noted in the HPI.   Past Medical History:  Diagnosis Date   GERD (gastroesophageal reflux disease)    Hypertension    Kidney calculi    Sleep apnea         Family History  Problem Relation Age of Onset   Hypertension Mother    Hypertension Father    Heart disease Father    COPD Father    Hypertension Sister    Kidney disease Brother    Kidney disease Daughter      Social History:   reports that he quit smoking about 8 years ago. He has a 10.00 pack-year smoking history. He has never used smokeless tobacco. He reports current alcohol use. He reports that he does not use drugs.  Medications  Current Facility-Administered Medications:    0.9 %  sodium chloride infusion, , Intravenous, Continuous, Mansy, Jan A, MD, Last Rate: 100 mL/hr at 01/25/22 2213,  New Bag at 01/25/22 2213   acetaminophen (TYLENOL) tablet 650 mg, 650 mg, Oral, Q4H PRN **OR** acetaminophen (TYLENOL) 160 MG/5ML solution 650 mg, 650 mg, Per Tube, Q4H PRN **OR** acetaminophen (TYLENOL) suppository 650 mg, 650 mg, Rectal, Q4H PRN, Mansy, Jan A, MD   aspirin EC tablet 81 mg, 81 mg, Oral, Daily, Mansy, Jan A, MD   atorvastatin (LIPITOR) tablet 20 mg, 20 mg, Oral, Daily, Mansy, Jan A, MD   clopidogrel (PLAVIX) tablet 75 mg, 75 mg, Oral, Daily, Mansy, Jan A, MD   enoxaparin (LOVENOX) injection 40 mg, 40 mg, Subcutaneous, Q24H, Mansy, Jan A, MD, 40 mg at 01/25/22 2212   lisinopril (ZESTRIL) tablet 20 mg, 20 mg, Oral, Daily, Mansy, Jan A, MD, 20 mg at 01/25/22 2212   ondansetron (ZOFRAN) injection 4 mg, 4 mg, Intravenous, Q4H PRN, Mansy, Jan A, MD   pantoprazole (PROTONIX) EC tablet 40 mg, 40 mg, Oral, Daily, Mansy, Jan A, MD, 40 mg at 01/25/22 2212   senna-docusate (Senokot-S) tablet 1 tablet, 1 tablet, Oral, QHS PRN, Mansy, Jan A, MD   traZODone (DESYREL) tablet 25 mg, 25 mg, Oral, QHS PRN, Mansy, Jan A, MD, 25 mg at 01/26/22 0157   Exam: Current vital signs: BP (!) 165/81 (BP Location: Left Arm)    Pulse 68    Temp (!) 97.5 F (36.4 C) (Oral)    Resp 16    Ht 5\' 11"  (1.803 m)  Wt (!) 140.6 kg    SpO2 95%    BMI 43.23 kg/m  Vital signs in last 24 hours: Temp:  [97.5 F (36.4 C)-98.5 F (36.9 C)] 97.5 F (36.4 C) (03/08 0738) Pulse Rate:  [67-82] 68 (03/08 0738) Resp:  [10-22] 16 (03/08 0738) BP: (139-219)/(81-130) 165/81 (03/08 0738) SpO2:  [91 %-99 %] 95 % (03/08 0738) Weight:  [140.6 kg] 140.6 kg (03/08 0057) General: Awake alert in no distress HEENT: Normocephalic, atraumatic, poor oral dentition. Lungs: Clear Cardiovascular: Regular rate rhythm Abdomen nondistended nontender Extremities warm well perfused Neurological exam Awake alert oriented x3 No dysarthria No evidence of aphasia Cranial nerves II to XII: Pupils equal round reactive to light, extraocular  movements intact, visual fields are full but he reports that on his leftward gaze he sees 2 fingers when I am showing him 1 finger-he reports that the fingers are 1 on top of each other.  This goes away when he covers right or the left eye.  Face appears symmetric.  Facial sensation intact. Motor examination with no weakness in any of the 4 extremities-5/5 without vertical drift in all fours. Sensation intact to light touch without extinction Coordination with no dysmetria Gait testing deferred NIH stroke scale-0  Labs I have reviewed labs in epic and the results pertinent to this consultation are:  CBC    Component Value Date/Time   WBC 11.8 (H) 01/26/2022 0105   RBC 5.59 01/26/2022 0105   HGB 16.9 01/26/2022 0105   HCT 49.3 01/26/2022 0105   PLT 188 01/26/2022 0105   MCV 88.2 01/26/2022 0105   MCH 30.2 01/26/2022 0105   MCHC 34.3 01/26/2022 0105   RDW 13.4 01/26/2022 0105   LYMPHSABS 2.0 01/25/2022 1112   MONOABS 0.5 01/25/2022 1112   EOSABS 0.1 01/25/2022 1112   BASOSABS 0.0 01/25/2022 1112    CMP     Component Value Date/Time   NA 137 01/25/2022 1112   K 4.1 01/25/2022 1112   CL 103 01/25/2022 1112   CO2 25 01/25/2022 1112   GLUCOSE 133 (H) 01/25/2022 1112   BUN 7 01/25/2022 1112   CREATININE 1.02 01/26/2022 0105   CALCIUM 8.9 01/25/2022 1112   PROT 6.9 01/25/2022 1550   ALBUMIN 3.7 01/25/2022 1550   AST 16 01/25/2022 1550   ALT 15 01/25/2022 1550   ALKPHOS 66 01/25/2022 1550   BILITOT 0.9 01/25/2022 1550   GFRNONAA >60 01/26/2022 0105   GFRAA >90 03/31/2014 1440    Lipid Panel     Component Value Date/Time   CHOL 234 (H) 01/26/2022 0105   TRIG 190 (H) 01/26/2022 0105   HDL 37 (L) 01/26/2022 0105   CHOLHDL 6.3 01/26/2022 0105   VLDL 38 01/26/2022 0105   LDLCALC 159 (H) 01/26/2022 0105  LDL 159 A1c 4.8   Imaging I have reviewed the images obtained:  CT-head multiple chronic lacunar infarctions  MRI examination of the brain-patchy small volume  acute to early subacute ischemic infarctions involving the right thalamus, right basal ganglia and right splenium of the corpus callosum as well as left cerebellum.  Underlying cerebral white matter disease with multiple probable superimposed remote lacunar infarctions.  Overall appearance chronic microvascular disease with differentials including demyelinating disease in the appropriate context. MR angiography of the head negative for large vessel occlusion.  Distal small vessel atheromatous irregularities without any hemodynamically significant stenosis.  MRA neck with wide patency of both carotids and vertebrals in the neck.  Assessment:  46 year old man who presented to  the emergency room for evaluation of dizziness as well as diplopia noted to have acute to early subacute ischemic infarctions that involve the right thalamus, right basal ganglia, right splenium of the corpus callosum as well as the left cerebellum with underlying cerebral white matter disease and probable chronic lacunar infarctions on MRI brain without contrast.  He does have stroke risk factors.  That said, some of lesions on his FLAIR imaging are round to oval and periventricular which can be seen in demyelinating disease, along with black holes on T1 images.  We will work him up for stroke which is either cardioembolic ischemic infarcts or concomitant small vessel disease.In any case, with his young age, he will need further work-up.  Additionally, we will obtain MR brain with contrast and MR C-spine with and without contrast to look for any evidence of demyelination given his age as well as atypical appearance of lesions on MRI.  Impression: Likely acute ischemic strokes Rule out demyelination given the atypical appearance of the abnormalities on the MRI brain without contrast.  Recommendations: 2D echocardiogram Frequent neurochecks Telemetry Aspirin 325+ Plavix for 3 months followed by aspirin only High intensity  statin-atorvastatin 80 for goal LDL of less than 70. Lifestyle modifications including smoking cessation-discussed in detail with the patient PT OT Speech therapy Based on the 2D echocardiogram results, he might need TEE. I will also send out hypercoagulable work-up MR brain without has been completed-I will order MR brain with contrast.  Additionally will order MR C-spine with contrast to look for any evidence of demyelination in the cervical spinal cord.  Plan was discussed with Dr. Karleen Hampshire. Also imaging discussed with Dr. Erlinda Hong  -- Amie Portland, MD Neurologist [facility] Pager: [phone]

## 2022-01-26 NOTE — TOC Transition Note (Signed)
Transition of Care (TOC) - CM/SW Discharge Note ? ? ?Patient Details  ?Name: Timothy Pratt ?MRN: [id number] ?Date of Birth: 1975/12/17 ? ?Transition of Care (TOC) CM/SW Contact:  ?Lawerance Sabal, RN ?Phone Number: ?01/26/2022, 2:13 PM ? ? ?Clinical Narrative:    ?Spoke w patient at bedside. He states that his plan is to return home to his parents house for support at DC. ?He is agreeable to recommendations for outpatient PT. He states that he would like to go to [facility]. Added to AVS and referral placed in computer. He understands to call for fastest scheduling.  ?No DME identified. ? ? ? ? ?  ?Barriers to Discharge: Continued Medical Work up ? ? ?Patient Goals and CMS Choice ?Patient states their goals for this hospitalization and ongoing recovery are:: to return home ?  ?  ? ?Discharge Placement ?  ?           ?  ?  ?  ?  ? ?Discharge Plan and Services ?  ?  ?           ?  ?  ?  ?  ?  ?  ?  ?  ?  ?  ? ?Social Determinants of Health (SDOH) Interventions ?  ? ? ?Readmission Risk Interventions ?No flowsheet data found. ? ? ? ? ?

## 2022-01-26 NOTE — Progress Notes (Signed)
SLP Cancellation Note ? ?Patient Details ?Name: RUTGER SALTON ?MRN: [id number] ?DOB: December 07, 1975 ? ? ?Cancelled treatment:       Reason Eval/Treat Not Completed: SLP screened, no needs identified, will sign off. Per chart review and briefly meeting with patient, no current speech, language or cognitive impairments and full evaluation not warranted. Thank you for this referral! ? ?Angela Nevin, MA, CCC-SLP ?Speech Therapy ? ?

## 2022-01-26 NOTE — Progress Notes (Signed)
? ? ?  [facility] has been requested to perform a transesophageal echocardiogram on Timothy Pratt for stroke.  After careful review of history and examination, the risks and benefits of transesophageal echocardiogram have been explained including risks of esophageal damage, perforation (1:10,000 risk), bleeding, pharyngeal hematoma as well as other potential complications associated with conscious sedation including aspiration, arrhythmia, respiratory failure and death. Alternatives to treatment were discussed, questions were answered. Patient is willing to proceed. 3 ? ?Pt is scheduled for TEE 01/27/22 at 0730 with Dr. Rennis Golden. NPO at [facility] please. ? ?Marcelino Duster, PA  ?01/26/2022 1:42 PM  ? ?

## 2022-01-26 NOTE — Progress Notes (Signed)
[facility]                                                                               Timothy Pratt, is a 46 y.o. male, DOB - 1976/02/10, ZOX:[id number] Admit date - 01/25/2022    Outpatient Primary MD for the patient is Pcp, No  LOS - 0  days    Brief summary   Timothy Pratt is a 47 y.o. Caucasian male with medical history significant for essential hypertension, GERD and obstructive sleep apnea who presented to the emergency room with acute onset of diplopia, symptoms started 12 hours ago, prior to the ED visit. He also reports associated vertigo, nausea and vomiting.  He was admitted for stroke work up.    Assessment & Plan    Acute to early subacute ischemic infarcts involving the right thalamus, right basal ganglia and right splenium and left cerebellum:  - admitted for stroke work up.  - MRI brain with contrast and C Spine to be ordered by neurology for evaluation of demyelination given abnormalities and atypical appearance on the MRI.  - Full hypercoagulable work up ordered.  - ECHO is Pending.  - started the patient on aspirin 325 mg Daily and plavix 75 mg and lipitor 80 mg daily.  LDL is 159  Hemoglobin A1c is 4.8% Therapy evaluations recommending outpatient PT.    Hypertension:  Appear to be optimal.    GERD Stable.    OSA    Estimated body mass index is 43.23 kg/m as calculated from the following:   Height as of this encounter:  (1.803 m).   Weight as of this encounter: 140.6 kg.  Code Status: full code.  DVT Prophylaxis:  enoxaparin (LOVENOX) injection 40 mg Start: 01/25/22 2145   Level of Care: Level of care: Telemetry Medical Family Communication: None at bedside.   Disposition Plan:     Remains inpatient appropriate:  further work for stroke   Procedures:  MRI brain Echocardiogram.  TEE scheduled for 3/9.   Consultants:   Neurology.   Antimicrobials:   Anti-infectives (From admission, onward)    None         Medications  Scheduled Meds:  aspirin  325 mg Oral Daily   [START ON 01/27/2022] atorvastatin  80 mg Oral Daily   clopidogrel  75 mg Oral Daily   enoxaparin (LOVENOX) injection  40 mg Subcutaneous Q24H   lisinopril  20 mg Oral Daily   pantoprazole  40 mg Oral Daily   Continuous Infusions:   PRN Meds:.acetaminophen **OR** acetaminophen (TYLENOL) oral liquid 160 mg/5 mL **OR** acetaminophen, ondansetron (ZOFRAN) IV, senna-docusate, traZODone    Subjective:   Timothy Pratt was seen and examined today.  No chest pain , sob, no paraesthesias.   Objective:   Vitals:   01/25/22 2030 01/26/22 0057 01/26/22 0334 01/26/22 0738  BP: (!) 198/99 (!) 158/97 (!) 139/91 (!) 165/81  Pulse: 72 77 68 68  Resp: Temp:  98.1 F (36.7 C) 98.5 F (36.9 C) (!) 97.5 F (36.4 C)  TempSrc:  Oral Oral Oral  SpO2: 95% 95% 91% 95%  Weight:  (!) 140.6 kg  Height:  5\' 11"  (1.803 m)     No intake or output data in the 24 hours ending 01/26/22 1154 Filed Weights   01/26/22 0057  Weight: (!) 140.6 kg     Exam General exam: Appears calm and comfortable  Respiratory system: Clear to auscultation. Respiratory effort normal. Cardiovascular system: S1 & S2 heard, RRR. No JVD,  No pedal edema. Gastrointestinal system: Abdomen is nondistended, soft and nontender.  Normal bowel sounds heard. Central nervous system: Alert and oriented. Diplopia intermittent. No focal deficits.  Extremities: Symmetric 5 x 5 power. Skin: No rashes, lesions or ulcers Psychiatry: mood is appropriate.     Data Reviewed:  I have personally reviewed following labs and imaging studies   CBC Lab Results  Component Value Date   WBC 11.8 (H) 01/26/2022   RBC 5.59 01/26/2022   HGB 16.9 01/26/2022   HCT 49.3 01/26/2022   MCV 88.2 01/26/2022   MCH 30.2 01/26/2022   PLT 188 01/26/2022   MCHC 34.3 01/26/2022   RDW 13.4 01/26/2022   LYMPHSABS 2.0 01/25/2022   MONOABS 0.5 01/25/2022   EOSABS  0.1 01/25/2022   BASOSABS 0.0 01/25/2022     Last metabolic panel Lab Results  Component Value Date   NA 137 01/25/2022   K 4.1 01/25/2022   CL 103 01/25/2022   CO2 25 01/25/2022   BUN 7 01/25/2022   CREATININE 1.02 01/26/2022   GLUCOSE 133 (H) 01/25/2022   GFRNONAA >60 01/26/2022   GFRAA >90 03/31/2014   CALCIUM 8.9 01/25/2022   PROT 6.9 01/25/2022   ALBUMIN 3.7 01/25/2022   BILITOT 0.9 01/25/2022   ALKPHOS 66 01/25/2022   AST 16 01/25/2022   ALT 15 01/25/2022   ANIONGAP 9 01/25/2022    CBG (last 3)  No results for input(s): GLUCAP in the last 72 hours.    Coagulation Profile: Recent Labs  Lab 01/25/22 1550  INR 1.0     Radiology Studies: CT HEAD WO CONTRAST  Result Date: 01/25/2022 CLINICAL DATA:  Altered mental status, double vision EXAM: CT HEAD WITHOUT CONTRAST TECHNIQUE: Contiguous axial images were obtained from the base of the skull through the vertex without intravenous contrast. RADIATION DOSE REDUCTION: This exam was performed according to the departmental dose-optimization program which includes automated exposure control, adjustment of the mA and/or kV according to patient size and/or use of iterative reconstruction technique. COMPARISON:  Head CT 05/03/2008 FINDINGS: Brain: There are multiple foci of hypodensity in the bilateral frontal lobe periventricular white matter, left caudate head, left lentiform nucleus, and right splenium of the corpus callosum. These are consistent with multiple lacunar infarcts, favored remote but new since 2009. There is no definite evidence of acute infarct. There is no evidence of acute intracranial hemorrhage or extra-axial fluid collection. Background parenchymal volume is normal. The ventricles are normal in size. There is no mass lesion.  There is no mass effect or midline shift. Vascular: No hyperdense vessel or unexpected calcification. Skull: Normal. Negative for fracture or focal lesion. Sinuses/Orbits: There is mild  mucosal thickening in the paranasal sinuses. The globes and orbits are unremarkable. Other: None. IMPRESSION: 1. 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 Consider MRI to evaluate for acute infarct as indicated. 2. No acute intracranial hemorrhage or extra-axial fluid collection. Electronically Signed   By: Lesia HausenPeter  Noone M.D.   On: 01/25/2022 16:10   MR ANGIO HEAD WO CONTRAST  Result Date: 01/25/2022 CLINICAL DATA:  Initial evaluation  for acute neuro deficit, stroke suspected. EXAM: MRI HEAD WITHOUT CONTRAST MRA HEAD WITHOUT CONTRAST MRA NECK WITHOUT AND WITH CONTRAST TECHNIQUE: Multiplanar, multi-echo pulse sequences of the brain and surrounding structures were acquired without intravenous contrast. Angiographic images of the Circle of Willis were acquired using MRA technique without intravenous contrast. Angiographic images of the neck were acquired using MRA technique without and with intravenous contrast. Carotid stenosis measurements (when applicable) are obtained utilizing NASCET criteria, using the distal internal carotid diameter as the denominator. CONTRAST:  36mL GADAVIST GADOBUTROL 1 MMOL/ML IV SOLN COMPARISON:  Prior head CT from earlier the same day. FINDINGS: MRI HEAD FINDINGS Brain: Cerebral volume within normal limits. Patchy T2/FLAIR hyperintensity involving the periventricular and deep white matter both cerebral hemispheres as well as the pons, most likely related to chronic microvascular ischemic disease. Multiple scattered superimposed remote lacunar infarcts noted about the corpus callosum, periventricular white matter, deep gray nuclei, and pons. 9 mm focus of restricted diffusion seen involving the medial right thalamus (series 5, image 81). Additional 1.2 cm focus of restricted diffusion involving the inferior medial left cerebellum (series 5, image 63). Findings consistent with acute ischemic infarcts. Additional 2.1 cm  focus of mild diffusion abnormality involving the right splenium (series 5, image 8). Mild patchy diffusion abnormality noted at the right basal ganglia/internal capsule (series 5, image 83). Findings felt to be most consistent with evolving subacute ischemic changes. Minimal petechial blood products noted about the few of these areas of ischemia without frank hemorrhagic transformation or significant mass effect. No other evidence for acute or subacute ischemia. Gray-white matter differentiation otherwise maintained. No areas of chronic cortical infarction. No acute intracranial hemorrhage. Few scattered chronic micro hemorrhages noted, most pronounced at the pons, most likely hypertensive in nature. No mass lesion, midline shift or mass effect. No hydrocephalus or extra-axial fluid collection. Pituitary gland suprasellar region normal. Vascular: Major intracranial vascular flow voids are maintained. Skull and upper cervical spine: Craniocervical junction within normal limits. Bone marrow signal intensity diffusely decreased on T1 weighted sequence, nonspecific, but most commonly related to anemia, smoking or obesity. No focal marrow replacing lesion. No scalp soft tissue abnormality. Sinuses/Orbits: Right gaze noted. Globes orbital soft tissues demonstrate no other acute finding. Scattered mucosal thickening noted throughout the ethmoidal air cells. Paranasal sinuses are otherwise largely clear. No significant mastoid effusion. Other: None. MRA HEAD FINDINGS Anterior circulation: Both internal carotid arteries patent to the termini without significant stenosis. Mild atheromatous irregularity noted throughout the carotid siphons. A1 segments patent bilaterally. Left A1 hypoplastic. Normal anterior communicating artery complex. Anterior cerebral arteries patent without significant stenosis. No M1 stenosis or occlusion. Normal MCA bifurcations. No proximal MCA branch occlusion. Distal small vessel atheromatous  irregularity seen throughout the MCA branches bilaterally. Posterior circulation: Both vertebral arteries patent to the vertebrobasilar junction without stenosis. Right vertebral artery slightly dominant. Both PICA patent. Basilar patent to its distal aspect without stenosis. Superior cerebellar and posterior cerebral arteries patent bilaterally. Distal small atheromatous changes noted within the distal PCA branches bilaterally. Anatomic variants: None significant.  No aneurysm. MRA NECK FINDINGS Aortic arch: Examination mildly degraded by motion artifact. Visualized aortic arch normal caliber with normal 3 vessel morphology. No stenosis seen about the origin the great vessels. Right carotid system: Right common and internal carotid arteries patent without stenosis or evidence for dissection. No significant atheromatous change or narrowing about the right carotid bulb. Left carotid system: Left common and internal carotid arteries patent without stenosis or evidence for dissection. No significant  atheromatous change or irregularity about the left carotid bulb. Vertebral arteries: Both vertebral arteries arise from subclavian arteries. No proximal subclavian artery stenosis. Right vertebral artery slightly dominant. Vertebral arteries widely patent without stenosis or evidence for dissection. Other: None IMPRESSION: MRI HEAD IMPRESSION: 1. Patchy small volume acute to early subacute ischemic infarcts involving the right thalamus, right basal ganglia, right splenium, and left cerebellum as above. 2. Underlying cerebral white matter disease with multiple probable superimposed remote lacunar infarcts as above. 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 Possible demyelinating disease could conceivably have this appearance as well, and could be considered in the correct clinical setting. MRA HEAD IMPRESSION: 1. Negative  intracranial MRA for large vessel occlusion. 2. Distal small vessel atheromatous irregularity. No hemodynamically significant or correctable stenosis. MRA NECK IMPRESSION: Wide patency of both carotid artery systems and vertebral arteries within the neck. Electronically Signed   By: Rise Mu M.D.   On: 01/25/2022 19:08   MR Angiogram Neck W or Wo Contrast  Result Date: 01/25/2022 CLINICAL DATA:  Initial evaluation for acute neuro deficit, stroke suspected. EXAM: MRI HEAD WITHOUT CONTRAST MRA HEAD WITHOUT CONTRAST MRA NECK WITHOUT AND WITH CONTRAST TECHNIQUE: Multiplanar, multi-echo pulse sequences of the brain and surrounding structures were acquired without intravenous contrast. Angiographic images of the Circle of Willis were acquired using MRA technique without intravenous contrast. Angiographic images of the neck were acquired using MRA technique without and with intravenous contrast. Carotid stenosis measurements (when applicable) are obtained utilizing NASCET criteria, using the distal internal carotid diameter as the denominator. CONTRAST:  10mL GADAVIST GADOBUTROL 1 MMOL/ML IV SOLN COMPARISON:  Prior head CT from earlier the same day. FINDINGS: MRI HEAD FINDINGS Brain: Cerebral volume within normal limits. Patchy T2/FLAIR hyperintensity involving the periventricular and deep white matter both cerebral hemispheres as well as the pons, most likely related to chronic microvascular ischemic disease. Multiple scattered superimposed remote lacunar infarcts noted about the corpus callosum, periventricular white matter, deep gray nuclei, and pons. 9 mm focus of restricted diffusion seen involving the medial right thalamus (series 5, image 81). Additional 1.2 cm focus of restricted diffusion involving the inferior medial left cerebellum (series 5, image 63). Findings consistent with acute ischemic infarcts. Additional 2.1 cm focus of mild diffusion abnormality involving the right splenium (series 5,  image 8). Mild patchy diffusion abnormality noted at the right basal ganglia/internal capsule (series 5, image 83). Findings felt to be most consistent with evolving subacute ischemic changes. Minimal petechial blood products noted about the few of these areas of ischemia without frank hemorrhagic transformation or significant mass effect. No other evidence for acute or subacute ischemia. Gray-white matter differentiation otherwise maintained. No areas of chronic cortical infarction. No acute intracranial hemorrhage. Few scattered chronic micro hemorrhages noted, most pronounced at the pons, most likely hypertensive in nature. No mass lesion, midline shift or mass effect. No hydrocephalus or extra-axial fluid collection. Pituitary gland suprasellar region normal. Vascular: Major intracranial vascular flow voids are maintained. Skull and upper cervical spine: Craniocervical junction within normal limits. Bone marrow signal intensity diffusely decreased on T1 weighted sequence, nonspecific, but most commonly related to anemia, smoking or obesity. No focal marrow replacing lesion. No scalp soft tissue abnormality. Sinuses/Orbits: Right gaze noted. Globes orbital soft tissues demonstrate no other acute finding. Scattered mucosal thickening noted throughout the ethmoidal air cells. Paranasal sinuses are otherwise largely clear. No significant mastoid effusion. Other: None. MRA HEAD FINDINGS  Anterior circulation: Both internal carotid arteries patent to the termini without significant stenosis. Mild atheromatous irregularity noted throughout the carotid siphons. A1 segments patent bilaterally. Left A1 hypoplastic. Normal anterior communicating artery complex. Anterior cerebral arteries patent without significant stenosis. No M1 stenosis or occlusion. Normal MCA bifurcations. No proximal MCA branch occlusion. Distal small vessel atheromatous irregularity seen throughout the MCA branches bilaterally. Posterior circulation:  Both vertebral arteries patent to the vertebrobasilar junction without stenosis. Right vertebral artery slightly dominant. Both PICA patent. Basilar patent to its distal aspect without stenosis. Superior cerebellar and posterior cerebral arteries patent bilaterally. Distal small atheromatous changes noted within the distal PCA branches bilaterally. Anatomic variants: None significant.  No aneurysm. MRA NECK FINDINGS Aortic arch: Examination mildly degraded by motion artifact. Visualized aortic arch normal caliber with normal 3 vessel morphology. No stenosis seen about the origin the great vessels. Right carotid system: Right common and internal carotid arteries patent without stenosis or evidence for dissection. No significant atheromatous change or narrowing about the right carotid bulb. Left carotid system: Left common and internal carotid arteries patent without stenosis or evidence for dissection. No significant atheromatous change or irregularity about the left carotid bulb. Vertebral arteries: Both vertebral arteries arise from subclavian arteries. No proximal subclavian artery stenosis. Right vertebral artery slightly dominant. Vertebral arteries widely patent without stenosis or evidence for dissection. Other: None IMPRESSION: MRI HEAD IMPRESSION: 1. Patchy small volume acute to early subacute ischemic infarcts involving the right thalamus, right basal ganglia, right splenium, and left cerebellum as above. 2. Underlying cerebral white matter disease with multiple probable superimposed remote lacunar infarcts as above. 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 Possible demyelinating disease could conceivably have this appearance as well, and could be considered in the correct clinical setting. MRA HEAD IMPRESSION: 1. Negative intracranial MRA for large vessel occlusion. 2. Distal small vessel atheromatous  irregularity. No hemodynamically significant or correctable stenosis. MRA NECK IMPRESSION: Wide patency of both carotid artery systems and vertebral arteries within the neck. Electronically Signed   By: Rise Mu M.D.   On: 01/25/2022 19:08   MR BRAIN WO CONTRAST  Result Date: 01/25/2022 CLINICAL DATA:  Initial evaluation for acute neuro deficit, stroke suspected. EXAM: MRI HEAD WITHOUT CONTRAST MRA HEAD WITHOUT CONTRAST MRA NECK WITHOUT AND WITH CONTRAST TECHNIQUE: Multiplanar, multi-echo pulse sequences of the brain and surrounding structures were acquired without intravenous contrast. Angiographic images of the Circle of Willis were acquired using MRA technique without intravenous contrast. Angiographic images of the neck were acquired using MRA technique without and with intravenous contrast. Carotid stenosis measurements (when applicable) are obtained utilizing NASCET criteria, using the distal internal carotid diameter as the denominator. CONTRAST:  53mL GADAVIST GADOBUTROL 1 MMOL/ML IV SOLN COMPARISON:  Prior head CT from earlier the same day. FINDINGS: MRI HEAD FINDINGS Brain: Cerebral volume within normal limits. Patchy T2/FLAIR hyperintensity involving the periventricular and deep white matter both cerebral hemispheres as well as the pons, most likely related to chronic microvascular ischemic disease. Multiple scattered superimposed remote lacunar infarcts noted about the corpus callosum, periventricular white matter, deep gray nuclei, and pons. 9 mm focus of restricted diffusion seen involving the medial right thalamus (series 5, image 81). Additional 1.2 cm focus of restricted diffusion involving the inferior medial left cerebellum (series 5, image 63). Findings consistent with acute ischemic infarcts. Additional 2.1 cm focus of mild diffusion abnormality involving the right splenium (series 5, image 8).  Mild patchy diffusion abnormality noted at the right basal ganglia/internal capsule  (series 5, image 83). Findings felt to be most consistent with evolving subacute ischemic changes. Minimal petechial blood products noted about the few of these areas of ischemia without frank hemorrhagic transformation or significant mass effect. No other evidence for acute or subacute ischemia. Gray-white matter differentiation otherwise maintained. No areas of chronic cortical infarction. No acute intracranial hemorrhage. Few scattered chronic micro hemorrhages noted, most pronounced at the pons, most likely hypertensive in nature. No mass lesion, midline shift or mass effect. No hydrocephalus or extra-axial fluid collection. Pituitary gland suprasellar region normal. Vascular: Major intracranial vascular flow voids are maintained. Skull and upper cervical spine: Craniocervical junction within normal limits. Bone marrow signal intensity diffusely decreased on T1 weighted sequence, nonspecific, but most commonly related to anemia, smoking or obesity. No focal marrow replacing lesion. No scalp soft tissue abnormality. Sinuses/Orbits: Right gaze noted. Globes orbital soft tissues demonstrate no other acute finding. Scattered mucosal thickening noted throughout the ethmoidal air cells. Paranasal sinuses are otherwise largely clear. No significant mastoid effusion. Other: None. MRA HEAD FINDINGS Anterior circulation: Both internal carotid arteries patent to the termini without significant stenosis. Mild atheromatous irregularity noted throughout the carotid siphons. A1 segments patent bilaterally. Left A1 hypoplastic. Normal anterior communicating artery complex. Anterior cerebral arteries patent without significant stenosis. No M1 stenosis or occlusion. Normal MCA bifurcations. No proximal MCA branch occlusion. Distal small vessel atheromatous irregularity seen throughout the MCA branches bilaterally. Posterior circulation: Both vertebral arteries patent to the vertebrobasilar junction without stenosis. Right  vertebral artery slightly dominant. Both PICA patent. Basilar patent to its distal aspect without stenosis. Superior cerebellar and posterior cerebral arteries patent bilaterally. Distal small atheromatous changes noted within the distal PCA branches bilaterally. Anatomic variants: None significant.  No aneurysm. MRA NECK FINDINGS Aortic arch: Examination mildly degraded by motion artifact. Visualized aortic arch normal caliber with normal 3 vessel morphology. No stenosis seen about the origin the great vessels. Right carotid system: Right common and internal carotid arteries patent without stenosis or evidence for dissection. No significant atheromatous change or narrowing about the right carotid bulb. Left carotid system: Left common and internal carotid arteries patent without stenosis or evidence for dissection. No significant atheromatous change or irregularity about the left carotid bulb. Vertebral arteries: Both vertebral arteries arise from subclavian arteries. No proximal subclavian artery stenosis. Right vertebral artery slightly dominant. Vertebral arteries widely patent without stenosis or evidence for dissection. Other: None IMPRESSION: MRI HEAD IMPRESSION: 1. Patchy small volume acute to early subacute ischemic infarcts involving the right thalamus, right basal ganglia, right splenium, and left cerebellum as above. 2. Underlying cerebral white matter disease with multiple probable superimposed remote lacunar infarcts as above. 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 Possible demyelinating disease could conceivably have this appearance as well, and could be considered in the correct clinical setting. MRA HEAD IMPRESSION: 1. Negative intracranial MRA for large vessel occlusion. 2. Distal small vessel atheromatous irregularity. No hemodynamically significant or correctable stenosis. MRA NECK IMPRESSION:  Wide patency of both carotid artery systems and vertebral arteries within the neck. Electronically Signed   By: Rise Mu M.D.   On: 01/25/2022 19:08   MR BRAIN W CONTRAST  Result Date: 01/26/2022 CLINICAL DATA:  Vasculitis suspected, CNS.  Suspected demyelination. EXAM: MRI HEAD WITH CONTRAST TECHNIQUE: Multiplanar, multiecho pulse sequences of the brain and surrounding structures were obtained with intravenous  contrast. CONTRAST:  10mL GADAVIST GADOBUTROL 1 MMOL/ML IV SOLN COMPARISON:  Noncontrast head MRI 01/25/2022 FINDINGS: Axial and coronal T1 postcontrast sequences were performed with the coronal sequence being mildly to moderately motion degraded. Non-masslike enhancement involving the anterior body of the right caudate nucleus corresponds to a region of diffusion and T2 signal abnormality on yesterday's noncontrast MRI and is most compatible with a subacute infarct. No abnormal intracranial enhancement is identified elsewhere. IMPRESSION: Enhancing subacute right basal ganglia infarct. Electronically Signed   By: Sebastian Ache M.D.   On: 01/26/2022 11:26   MR CERVICAL SPINE W WO CONTRAST  Result Date: 01/26/2022 CLINICAL DATA:  Demyelinating disease. EXAM: MRI CERVICAL SPINE WITHOUT AND WITH CONTRAST TECHNIQUE: Multiplanar and multiecho pulse sequences of the cervical spine, to include the craniocervical junction and cervicothoracic junction, were obtained without and with intravenous contrast. CONTRAST:  10mL GADAVIST GADOBUTROL 1 MMOL/ML IV SOLN COMPARISON:  Head MRI 01/25/2022. FINDINGS: The study is motion degraded throughout, including moderate to severe motion on multiple axial sequences. Alignment: Normal. Vertebrae: No fracture, suspicious marrow lesion, or significant marrow edema. Cord: No convincing cord signal abnormality or abnormal enhancement identified within limitations of motion artifact. Posterior Fossa, vertebral arteries, paraspinal tissues: Lacunar infarcts in the pons  as shown on the recent head MRI. Preserved vertebral artery flow voids. Disc levels: C2-3: Mild uncovertebral spurring and mild facet arthrosis result in at most mild right neural foraminal stenosis without spinal stenosis. C3-4: Disc bulging and uncovertebral spurring result in mild right neural foraminal stenosis without spinal stenosis. C4-5: Negative. C5-6: Disc bulging, a left paracentral disc extrusion, and uncovertebral spurring result in moderate spinal stenosis with mild cord flattening and likely mild bilateral neural foraminal stenosis. C6-7: A small right paracentral disc protrusion results in at most mild spinal stenosis. Patent neural foramina. C7-T1: Negative. IMPRESSION: 1. Motion degraded examination. No evidence of demyelinating disease in the cervical spinal cord. 2. Cervical spondylosis most notable at C5-6 where there is moderate spinal stenosis. Electronically Signed   By: Sebastian Ache M.D.   On: 01/26/2022 11:22       Kathlen Mody M.D. [facility] 01/26/2022, 11:54 AM  Available via Epic secure chat 7am-7pm After 7 pm, please refer to night coverage provider listed on amion.

## 2022-01-26 NOTE — Progress Notes (Signed)
RT Note: Patient would like to wear CPAP during hospital stay. His machine at work has not been working. Wears FFM. Patient is being admitted to [facility] and will be transferring. Receiving RT notified. ?

## 2022-01-26 NOTE — Hospital Course (Signed)
Timothy Pratt is a 46 y.o. Caucasian male with medical history significant for essential hypertension, GERD and obstructive sleep apnea who presented to the emergency room with acute onset of diplopia, symptoms started 12 hours ago, prior to the ED visit. He also reports associated vertigo, nausea and vomiting.  ?He was admitted for stroke work up.  ?

## 2022-01-27 ENCOUNTER — Encounter (HOSPITAL_COMMUNITY)
Admission: EM | Disposition: A | Discharge status: Home / Self Care | Payer: Self-pay | Source: Home / Self Care | Attending: Internal Medicine

## 2022-01-27 ENCOUNTER — Other Ambulatory Visit (HOSPITAL_COMMUNITY): Payer: Self-pay

## 2022-01-27 ENCOUNTER — Inpatient Hospital Stay (HOSPITAL_COMMUNITY): Payer: Self-pay

## 2022-01-27 ENCOUNTER — Encounter (HOSPITAL_COMMUNITY): Payer: Self-pay | Admitting: Internal Medicine

## 2022-01-27 ENCOUNTER — Inpatient Hospital Stay (HOSPITAL_COMMUNITY): Payer: Self-pay | Admitting: Anesthesiology

## 2022-01-27 DIAGNOSIS — Q2112 Patent foramen ovale: Secondary | ICD-10-CM

## 2022-01-27 DIAGNOSIS — I639 Cerebral infarction, unspecified: Secondary | ICD-10-CM

## 2022-01-27 HISTORY — PX: TEE WITHOUT CARDIOVERSION: SHX5443

## 2022-01-27 HISTORY — PX: BUBBLE STUDY: SHX6837

## 2022-01-27 LAB — LUPUS ANTICOAGULANT PANEL
DRVVT: 38.1 s (ref 0.0–47.0)
PTT Lupus Anticoagulant: 34.6 s (ref 0.0–43.5)

## 2022-01-27 LAB — HOMOCYSTEINE: Homocysteine: 46.3 umol/L — ABNORMAL HIGH (ref 0.0–14.5)

## 2022-01-27 LAB — PROTEIN S ACTIVITY: Protein S Activity: 95 % (ref 63–140)

## 2022-01-27 LAB — PROTEIN S, TOTAL: Protein S Ag, Total: 85 % (ref 60–150)

## 2022-01-27 LAB — PROTEIN C ACTIVITY: Protein C Activity: 120 % (ref 73–180)

## 2022-01-27 SURGERY — ECHOCARDIOGRAM, TRANSESOPHAGEAL
Anesthesia: Monitor Anesthesia Care

## 2022-01-27 MED ORDER — LACTATED RINGERS IV SOLN: INTRAVENOUS | Status: DC | PRN

## 2022-01-27 MED ORDER — SODIUM CHLORIDE 0.9 % IV SOLN: INTRAVENOUS | Status: DC

## 2022-01-27 MED ORDER — LISINOPRIL 20 MG PO TABS
20.0000 mg | ORAL_TABLET | Freq: Once | ORAL | Status: AC
  Administered 2022-01-27: 11:00:00 20 mg via ORAL
  Filled 2022-01-27: qty 1

## 2022-01-27 MED ORDER — ATORVASTATIN CALCIUM 80 MG PO TABS
80.0000 mg | ORAL_TABLET | Freq: Every day | ORAL | 3 refills | Status: AC
  Filled 2022-01-27 – 2022-02-28 (×2): qty 30, 30d supply, fill #0
  Filled 2022-04-11: qty 30, 30d supply, fill #1
  Filled 2022-05-17 – 2022-05-20 (×2): qty 30, 30d supply, fill #2

## 2022-01-27 MED ORDER — ASPIRIN 325 MG PO TABS
325.0000 mg | ORAL_TABLET | Freq: Every day | ORAL | 3 refills | Status: AC
Start: 2022-01-28 — End: ?
  Filled 2022-01-27: qty 30, 30d supply, fill #0

## 2022-01-27 MED ORDER — LISINOPRIL 20 MG PO TABS: 40.0000 mg | ORAL_TABLET | Freq: Every day | ORAL | Status: DC

## 2022-01-27 MED ORDER — PROPOFOL 500 MG/50ML IV EMUL
INTRAVENOUS | Status: DC | PRN
  Administered 2022-01-27: 125 ug/kg/min via INTRAVENOUS

## 2022-01-27 MED ORDER — AMLODIPINE BESYLATE 5 MG PO TABS: 5.0000 mg | ORAL_TABLET | Freq: Every day | ORAL | Status: DC

## 2022-01-27 MED ORDER — AMLODIPINE BESYLATE 5 MG PO TABS
5.0000 mg | ORAL_TABLET | Freq: Every day | ORAL | Status: DC
Start: 2022-01-27 — End: 2022-01-27
  Administered 2022-01-27: 17:00:00 5 mg via ORAL
  Filled 2022-01-27: qty 1

## 2022-01-27 MED ORDER — CLOPIDOGREL BISULFATE 75 MG PO TABS
75.0000 mg | ORAL_TABLET | Freq: Every day | ORAL | 2 refills | Status: AC
Start: 2022-01-28 — End: ?
  Filled 2022-01-27 – 2022-02-28 (×2): qty 30, 30d supply, fill #0
  Filled 2022-04-11: qty 30, 30d supply, fill #1

## 2022-01-27 MED ORDER — BUTAMBEN-TETRACAINE-BENZOCAINE 2-2-14 % EX AERO
INHALATION_SPRAY | CUTANEOUS | Status: DC | PRN
  Administered 2022-01-27: 08:00:00 2 via TOPICAL

## 2022-01-27 MED ORDER — AMLODIPINE BESYLATE 5 MG PO TABS
5.0000 mg | ORAL_TABLET | Freq: Every day | ORAL | 3 refills | Status: AC
  Filled 2022-01-27 – 2022-02-28 (×2): qty 30, 30d supply, fill #0
  Filled 2022-04-11: qty 30, 30d supply, fill #1
  Filled 2022-05-17 – 2022-05-20 (×2): qty 30, 30d supply, fill #2

## 2022-01-27 MED ORDER — LISINOPRIL 40 MG PO TABS
40.0000 mg | ORAL_TABLET | Freq: Every day | ORAL | 3 refills | Status: DC
  Filled 2022-01-27 – 2022-02-28 (×2): qty 30, 30d supply, fill #0
  Filled 2022-04-11: qty 30, 30d supply, fill #1
  Filled 2022-05-17 – 2022-05-20 (×2): qty 30, 30d supply, fill #2

## 2022-01-27 MED ORDER — PROPOFOL 10 MG/ML IV BOLUS
INTRAVENOUS | Status: DC | PRN
  Administered 2022-01-27: 10 mg via INTRAVENOUS
  Administered 2022-01-27: 20 mg via INTRAVENOUS
  Administered 2022-01-27: 10 mg via INTRAVENOUS

## 2022-01-27 MED ORDER — LIDOCAINE 2% (20 MG/ML) 5 ML SYRINGE
INTRAMUSCULAR | Status: DC | PRN
Start: 2022-01-27 — End: 2022-01-27
  Administered 2022-01-27: 20 mg via INTRAVENOUS

## 2022-01-27 MED ORDER — PANTOPRAZOLE SODIUM 40 MG PO TBEC
40.0000 mg | DELAYED_RELEASE_TABLET | Freq: Every day | ORAL | 1 refills | Status: AC
  Filled 2022-01-27 – 2022-02-28 (×2): qty 30, 30d supply, fill #0

## 2022-01-27 NOTE — Progress Notes (Signed)
Physical Therapy Treatment ?Patient Details ?Name: Timothy Pratt ?MRN: [id number] ?DOB: 03/05/1976 ?Today's Date: 01/27/2022 ? ? ?History of Present Illness ODDIS WESTLING is a 46 y.o. Caucasian male who presented to the emergency room with acute onset of diplopia.  imaging revealed multiple lacunar inarct  PMH: essential hypertension, GERD and obstructive sleep apnea ? ?  ?PT Comments  ? ? Pt has progressed well and has met goals at mod I to Independent level.  Emphasis on high level balance.  DGI 24/24.   ?Recommendations for follow up therapy are one component of a multi-disciplinary discharge planning process, led by the attending physician.  Recommendations may be updated based on patient status, additional functional criteria and insurance authorization. ? ?Follow Up Recommendations ? Outpatient PT ?  ?  ?Assistance Recommended at Discharge PRN  ?Patient can return home with the following Assistance with cooking/housework;Assist for transportation ?  ?Equipment Recommendations ? None recommended by PT  ?  ?Recommendations for Other Services   ? ? ?  ?Precautions / Restrictions Precautions ?Precautions: Fall ?Precaution Comments: diplopia resolved ?Restrictions ?Weight Bearing Restrictions: No  ?  ? ?Mobility ? Bed Mobility ?Overal bed mobility: Modified Independent ?  ?  ?  ?  ?  ?  ?  ?  ? ?Transfers ?Overall transfer level: Modified independent ?  ?  ?  ?  ?  ?  ?  ?  ?  ?  ? ?Ambulation/Gait ?Ambulation/Gait assistance: Modified independent (Device/Increase time) ?Gait Distance (Feet): 500 Feet ?Assistive device: None ?Gait Pattern/deviations: Step-through pattern ?  ?Gait velocity interpretation: >4.37 ft/sec, indicative of normal walking speed ?  ?General Gait Details: steady and able to mobilize at age appropriate speeds. ? ? ?Stairs ?Stairs: Yes ?Stairs assistance: Modified independent (Device/Increase time) ?Stair Management: One rail Right, Alternating pattern, Forwards ?Number of Stairs:  10 ?General stair comments: safe with the rail ? ? ?Wheelchair Mobility ?  ? ?Modified Rankin (Stroke Patients Only) ?Modified Rankin (Stroke Patients Only) ?Modified Rankin: Slight disability ? ? ?  ?Balance   ?  ?  ?  ?  ?  ?  ?  ?  ?  ?  ?  ?  ?  ?  ?  ?Standardized Balance Assessment ?Standardized Balance Assessment : Dynamic Gait Index ?  ?Dynamic Gait Index ?Level Surface: Normal ?Change in Gait Speed: Normal ?Gait with Horizontal Head Turns: Normal ?Gait with Vertical Head Turns: Normal ?Gait and Pivot Turn: Normal ?Step Over Obstacle: Normal ?Step Around Obstacles: Normal ?Steps: Mild Impairment ?Total Score: 23 ?  ? ?  ?Cognition Arousal/Alertness: Awake/alert ?Behavior During Therapy: [facility] for tasks assessed/performed ?Overall Cognitive Status: Within Functional Limits for tasks assessed ?  ?  ?  ?  ?  ?  ?  ?  ?  ?  ?  ?  ?  ?  ?  ?  ?  ?  ?  ? ?  ?Exercises   ? ?  ?General Comments General comments (skin integrity, edema, etc.): pt reports 1 spot on the left he notices is visually impaired still ?  ?  ? ?Pertinent Vitals/Pain Pain Assessment ?Pain Assessment: No/denies pain  ? ? ?Home Living   ?  ?  ?  ?  ?  ?  ?  ?  ?  ?   ?  ?Prior Function    ?  ?  ?   ? ?PT Goals (current goals can now be found in the care plan section) Acute Rehab PT Goals ?  Patient Stated Goal: get home ?PT Goal Formulation: With patient ?Time For Goal Achievement: 02/09/22 ?Potential to Achieve Goals: Good ?Progress towards PT goals: Progressing toward goals ? ?  ?Frequency ? ? ? Min 4X/week ? ? ? ?  ?PT Plan Current plan remains appropriate  ? ? ?Co-evaluation   ?  ?  ?  ?  ? ?  ?AM-PAC PT "6 Clicks" Mobility   ?Outcome Measure ? Help needed turning from your back to your side while in a flat bed without using bedrails?: None ?Help needed moving from lying on your back to sitting on the side of a flat bed without using bedrails?: None ?Help needed moving to and from a bed to a chair (including a wheelchair)?: None ?Help needed  standing up from a chair using your arms (e.g., wheelchair or bedside chair)?: None ?Help needed to walk in hospital room?: None ?Help needed climbing 3-5 steps with a railing? : None ?6 Click Score: 24 ? ?  ?End of Session   ?Activity Tolerance: Patient tolerated treatment well ?Patient left: in bed;with call bell/phone within reach ?Nurse Communication: Mobility status ?PT Visit Diagnosis: Other symptoms and signs involving the nervous system (R29.898) ?  ? ? ?Time: 5329-9242 ?PT Time Calculation (min) (ACUTE ONLY): 14 min ? ?Charges:  $Gait Training: 8-22 mins          ?          ? ?01/27/2022 ? ?Ginger Carne., PT ?Acute Rehabilitation Services ?[phone]  (pager) ?[phone]  (office) ? ? ?Tessie Fass Norva Bowe ?01/27/2022, 12:47 PM ? ?

## 2022-01-27 NOTE — Progress Notes (Signed)
VASCULAR LAB ? ? ? ?Bilateral lower extremity venous duplex has been performed. ? ?See CV proc for preliminary results. ? ? ?Kit Mollett, RVT ?01/27/2022, 2:54 PM ? ?

## 2022-01-27 NOTE — Transfer of Care (Signed)
Immediate Anesthesia Transfer of Care Note ? ?Patient: Timothy Pratt ? ?Procedure(s) Performed: TRANSESOPHAGEAL ECHOCARDIOGRAM (TEE) ?BUBBLE STUDY ? ?Patient Location: PACU ? ?Anesthesia Type:MAC ? ?Level of Consciousness: awake, alert  and oriented ? ?Airway & Oxygen Therapy: Patient connected to nasal cannula oxygen ? ?Post-op Assessment: Report given to RN and Post -op Vital signs reviewed and stable ? ?Post vital signs: stable ? ?Last Vitals:  ?Vitals Value Taken Time  ?BP    ?Temp    ?Pulse 70 01/27/22 0841  ?Resp 13 01/27/22 0841  ?SpO2 98 % 01/27/22 0841  ?Vitals shown include unvalidated device data. ? ?Last Pain:  ?Vitals:  ? 01/27/22 0716  ?TempSrc: Temporal  ?PainSc: 0-No pain  ?   ? ?  ? ?Complications: No notable events documented. ?

## 2022-01-27 NOTE — Progress Notes (Signed)
Patient has no questions about the packet or education. TOC meds at bedside. IV taken out.  ?

## 2022-01-27 NOTE — Progress Notes (Addendum)
STROKE TEAM PROGRESS NOTE   SUBJECTIVE (INTERVAL HISTORY) No family is at the bedside.  Overall his condition is rapidly improving.  He still has mild left gaze unsustained nystagmus, but no ataxia, no reported other symptoms.  TEE done showed minimal PFO.  LE venous Doppler no DVT.   OBJECTIVE Temp:  [97.5 F (36.4 C)-98.8 F (37.1 C)] 97.6 F (36.4 C) (03/09 0845) Pulse Rate:  [63-80] 71 (03/09 0716) Cardiac Rhythm: Normal sinus rhythm (03/09 0845) Resp:  [11-19] 11 (03/09 0716) BP: (156-179)/(81-99) 179/96 (03/09 0716) SpO2:  [94 %-100 %] 100 % (03/09 0716)  No results for input(s): GLUCAP in the last 168 hours. Recent Labs  Lab 01/25/22 1112 01/26/22 0105  NA 137  --   K 4.1  --   CL 103  --   CO2 25  --   GLUCOSE 133*  --   BUN 7  --   CREATININE 0.86 1.02  CALCIUM 8.9  --    Recent Labs  Lab 01/25/22 1550  AST 16  ALT 15  ALKPHOS 66  BILITOT 0.9  PROT 6.9  ALBUMIN 3.7   Recent Labs  Lab 01/25/22 1112 01/26/22 0105  WBC 12.1* 11.8*  NEUTROABS 9.4*  --   HGB 17.9* 16.9  HCT 51.8 49.3  MCV 88.1 88.2  PLT 203 188   No results for input(s): CKTOTAL, CKMB, CKMBINDEX, TROPONINI in the last 168 hours. Recent Labs    01/25/22 1550  LABPROT 13.2  INR 1.0   Recent Labs    01/25/22 1725  COLORURINE YELLOW  LABSPEC 1.018  PHURINE 6.0  GLUCOSEU NEGATIVE  HGBUR SMALL*  BILIRUBINUR NEGATIVE  KETONESUR NEGATIVE  PROTEINUR NEGATIVE  NITRITE NEGATIVE  LEUKOCYTESUR NEGATIVE       Component Value Date/Time   CHOL 234 (H) 01/26/2022 0105   TRIG 190 (H) 01/26/2022 0105   HDL 37 (L) 01/26/2022 0105   CHOLHDL 6.3 01/26/2022 0105   VLDL 38 01/26/2022 0105   LDLCALC 159 (H) 01/26/2022 0105   Lab Results  Component Value Date   HGBA1C 4.8 01/26/2022      Component Value Date/Time   LABOPIA POSITIVE (A) 01/25/2022 1725   COCAINSCRNUR NONE DETECTED 01/25/2022 1725   LABBENZ NONE DETECTED 01/25/2022 1725   AMPHETMU NONE DETECTED 01/25/2022 1725    THCU NONE DETECTED 01/25/2022 1725   LABBARB NONE DETECTED 01/25/2022 1725    Recent Labs  Lab 01/25/22 1550  ETH <10    I have personally reviewed the radiological images below and agree with the radiology interpretations.  CT HEAD WO CONTRAST  Result Date: 01/25/2022 CLINICAL DATA:  Altered mental status, double vision EXAM: CT HEAD WITHOUT CONTRAST TECHNIQUE: Contiguous axial images were obtained from the base of the skull through the vertex without intravenous contrast. RADIATION DOSE REDUCTION: This exam was performed according to the departmental dose-optimization program which includes automated exposure control, adjustment of the mA and/or kV according to patient size and/or use of iterative reconstruction technique. COMPARISON:  Head CT 05/03/2008 FINDINGS: Brain: There are multiple foci of hypodensity in the bilateral frontal lobe periventricular white matter, left caudate head, left lentiform nucleus, and right splenium of the corpus callosum. These are consistent with multiple lacunar infarcts, favored remote but new since 2009. There is no definite evidence of acute infarct. There is no evidence of acute intracranial hemorrhage or extra-axial fluid collection. Background parenchymal volume is normal. The ventricles are normal in size. There is no mass lesion.  There is no mass  effect or midline shift. Vascular: No hyperdense vessel or unexpected calcification. Skull: Normal. Negative for fracture or focal lesion. Sinuses/Orbits: There is mild mucosal thickening in the paranasal sinuses. The globes and orbits are unremarkable. Other: None. IMPRESSION: 1. 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 Consider MRI to evaluate for acute infarct as indicated. 2. No acute intracranial hemorrhage or extra-axial fluid collection. Electronically Signed   By: Lesia Hausen M.D.   On: 01/25/2022 16:10   MR ANGIO HEAD WO CONTRAST  Result  Date: 01/25/2022 CLINICAL DATA:  Initial evaluation for acute neuro deficit, stroke suspected. EXAM: MRI HEAD WITHOUT CONTRAST MRA HEAD WITHOUT CONTRAST MRA NECK WITHOUT AND WITH CONTRAST TECHNIQUE: Multiplanar, multi-echo pulse sequences of the brain and surrounding structures were acquired without intravenous contrast. Angiographic images of the Circle of Willis were acquired using MRA technique without intravenous contrast. Angiographic images of the neck were acquired using MRA technique without and with intravenous contrast. Carotid stenosis measurements (when applicable) are obtained utilizing NASCET criteria, using the distal internal carotid diameter as the denominator. CONTRAST:  10mL GADAVIST GADOBUTROL 1 MMOL/ML IV SOLN COMPARISON:  Prior head CT from earlier the same day. FINDINGS: MRI HEAD FINDINGS Brain: Cerebral volume within normal limits. Patchy T2/FLAIR hyperintensity involving the periventricular and deep white matter both cerebral hemispheres as well as the pons, most likely related to chronic microvascular ischemic disease. Multiple scattered superimposed remote lacunar infarcts noted about the corpus callosum, periventricular white matter, deep gray nuclei, and pons. 9 mm focus of restricted diffusion seen involving the medial right thalamus (series 5, image 81). Additional 1.2 cm focus of restricted diffusion involving the inferior medial left cerebellum (series 5, image 63). Findings consistent with acute ischemic infarcts. Additional 2.1 cm focus of mild diffusion abnormality involving the right splenium (series 5, image 8). Mild patchy diffusion abnormality noted at the right basal ganglia/internal capsule (series 5, image 83). Findings felt to be most consistent with evolving subacute ischemic changes. Minimal petechial blood products noted about the few of these areas of ischemia without frank hemorrhagic transformation or significant mass effect. No other evidence for acute or subacute  ischemia. Gray-white matter differentiation otherwise maintained. No areas of chronic cortical infarction. No acute intracranial hemorrhage. Few scattered chronic micro hemorrhages noted, most pronounced at the pons, most likely hypertensive in nature. No mass lesion, midline shift or mass effect. No hydrocephalus or extra-axial fluid collection. Pituitary gland suprasellar region normal. Vascular: Major intracranial vascular flow voids are maintained. Skull and upper cervical spine: Craniocervical junction within normal limits. Bone marrow signal intensity diffusely decreased on T1 weighted sequence, nonspecific, but most commonly related to anemia, smoking or obesity. No focal marrow replacing lesion. No scalp soft tissue abnormality. Sinuses/Orbits: Right gaze noted. Globes orbital soft tissues demonstrate no other acute finding. Scattered mucosal thickening noted throughout the ethmoidal air cells. Paranasal sinuses are otherwise largely clear. No significant mastoid effusion. Other: None. MRA HEAD FINDINGS Anterior circulation: Both internal carotid arteries patent to the termini without significant stenosis. Mild atheromatous irregularity noted throughout the carotid siphons. A1 segments patent bilaterally. Left A1 hypoplastic. Normal anterior communicating artery complex. Anterior cerebral arteries patent without significant stenosis. No M1 stenosis or occlusion. Normal MCA bifurcations. No proximal MCA branch occlusion. Distal small vessel atheromatous irregularity seen throughout the MCA branches bilaterally. Posterior circulation: Both vertebral arteries patent to the vertebrobasilar junction without stenosis. Right vertebral artery slightly dominant. Both PICA patent. Basilar patent to its distal aspect  without stenosis. Superior cerebellar and posterior cerebral arteries patent bilaterally. Distal small atheromatous changes noted within the distal PCA branches bilaterally. Anatomic variants: None  significant.  No aneurysm. MRA NECK FINDINGS Aortic arch: Examination mildly degraded by motion artifact. Visualized aortic arch normal caliber with normal 3 vessel morphology. No stenosis seen about the origin the great vessels. Right carotid system: Right common and internal carotid arteries patent without stenosis or evidence for dissection. No significant atheromatous change or narrowing about the right carotid bulb. Left carotid system: Left common and internal carotid arteries patent without stenosis or evidence for dissection. No significant atheromatous change or irregularity about the left carotid bulb. Vertebral arteries: Both vertebral arteries arise from subclavian arteries. No proximal subclavian artery stenosis. Right vertebral artery slightly dominant. Vertebral arteries widely patent without stenosis or evidence for dissection. Other: None IMPRESSION: MRI HEAD IMPRESSION: 1. Patchy small volume acute to early subacute ischemic infarcts involving the right thalamus, right basal ganglia, right splenium, and left cerebellum as above. 2. Underlying cerebral white matter disease with multiple probable superimposed remote lacunar infarcts as above. 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 Possible demyelinating disease could conceivably have this appearance as well, and could be considered in the correct clinical setting. MRA HEAD IMPRESSION: 1. Negative intracranial MRA for large vessel occlusion. 2. Distal small vessel atheromatous irregularity. No hemodynamically significant or correctable stenosis. MRA NECK IMPRESSION: Wide patency of both carotid artery systems and vertebral arteries within the neck. Electronically Signed   By: Rise Mu M.D.   On: 01/25/2022 19:08   MR Angiogram Neck W or Wo Contrast  Result Date: 01/25/2022 CLINICAL DATA:  Initial evaluation for acute neuro deficit,  stroke suspected. EXAM: MRI HEAD WITHOUT CONTRAST MRA HEAD WITHOUT CONTRAST MRA NECK WITHOUT AND WITH CONTRAST TECHNIQUE: Multiplanar, multi-echo pulse sequences of the brain and surrounding structures were acquired without intravenous contrast. Angiographic images of the Circle of Willis were acquired using MRA technique without intravenous contrast. Angiographic images of the neck were acquired using MRA technique without and with intravenous contrast. Carotid stenosis measurements (when applicable) are obtained utilizing NASCET criteria, using the distal internal carotid diameter as the denominator. CONTRAST:  45mL GADAVIST GADOBUTROL 1 MMOL/ML IV SOLN COMPARISON:  Prior head CT from earlier the same day. FINDINGS: MRI HEAD FINDINGS Brain: Cerebral volume within normal limits. Patchy T2/FLAIR hyperintensity involving the periventricular and deep white matter both cerebral hemispheres as well as the pons, most likely related to chronic microvascular ischemic disease. Multiple scattered superimposed remote lacunar infarcts noted about the corpus callosum, periventricular white matter, deep gray nuclei, and pons. 9 mm focus of restricted diffusion seen involving the medial right thalamus (series 5, image 81). Additional 1.2 cm focus of restricted diffusion involving the inferior medial left cerebellum (series 5, image 63). Findings consistent with acute ischemic infarcts. Additional 2.1 cm focus of mild diffusion abnormality involving the right splenium (series 5, image 8). Mild patchy diffusion abnormality noted at the right basal ganglia/internal capsule (series 5, image 83). Findings felt to be most consistent with evolving subacute ischemic changes. Minimal petechial blood products noted about the few of these areas of ischemia without frank hemorrhagic transformation or significant mass effect. No other evidence for acute or subacute ischemia. Gray-white matter differentiation otherwise maintained. No areas of  chronic cortical infarction. No acute intracranial hemorrhage. Few scattered chronic micro hemorrhages noted, most pronounced at the pons, most likely hypertensive in nature.  No mass lesion, midline shift or mass effect. No hydrocephalus or extra-axial fluid collection. Pituitary gland suprasellar region normal. Vascular: Major intracranial vascular flow voids are maintained. Skull and upper cervical spine: Craniocervical junction within normal limits. Bone marrow signal intensity diffusely decreased on T1 weighted sequence, nonspecific, but most commonly related to anemia, smoking or obesity. No focal marrow replacing lesion. No scalp soft tissue abnormality. Sinuses/Orbits: Right gaze noted. Globes orbital soft tissues demonstrate no other acute finding. Scattered mucosal thickening noted throughout the ethmoidal air cells. Paranasal sinuses are otherwise largely clear. No significant mastoid effusion. Other: None. MRA HEAD FINDINGS Anterior circulation: Both internal carotid arteries patent to the termini without significant stenosis. Mild atheromatous irregularity noted throughout the carotid siphons. A1 segments patent bilaterally. Left A1 hypoplastic. Normal anterior communicating artery complex. Anterior cerebral arteries patent without significant stenosis. No M1 stenosis or occlusion. Normal MCA bifurcations. No proximal MCA branch occlusion. Distal small vessel atheromatous irregularity seen throughout the MCA branches bilaterally. Posterior circulation: Both vertebral arteries patent to the vertebrobasilar junction without stenosis. Right vertebral artery slightly dominant. Both PICA patent. Basilar patent to its distal aspect without stenosis. Superior cerebellar and posterior cerebral arteries patent bilaterally. Distal small atheromatous changes noted within the distal PCA branches bilaterally. Anatomic variants: None significant.  No aneurysm. MRA NECK FINDINGS Aortic arch: Examination mildly degraded  by motion artifact. Visualized aortic arch normal caliber with normal 3 vessel morphology. No stenosis seen about the origin the great vessels. Right carotid system: Right common and internal carotid arteries patent without stenosis or evidence for dissection. No significant atheromatous change or narrowing about the right carotid bulb. Left carotid system: Left common and internal carotid arteries patent without stenosis or evidence for dissection. No significant atheromatous change or irregularity about the left carotid bulb. Vertebral arteries: Both vertebral arteries arise from subclavian arteries. No proximal subclavian artery stenosis. Right vertebral artery slightly dominant. Vertebral arteries widely patent without stenosis or evidence for dissection. Other: None IMPRESSION: MRI HEAD IMPRESSION: 1. Patchy small volume acute to early subacute ischemic infarcts involving the right thalamus, right basal ganglia, right splenium, and left cerebellum as above. 2. Underlying cerebral white matter disease with multiple probable superimposed remote lacunar infarcts as above. 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 Possible demyelinating disease could conceivably have this appearance as well, and could be considered in the correct clinical setting. MRA HEAD IMPRESSION: 1. Negative intracranial MRA for large vessel occlusion. 2. Distal small vessel atheromatous irregularity. No hemodynamically significant or correctable stenosis. MRA NECK IMPRESSION: Wide patency of both carotid artery systems and vertebral arteries within the neck. Electronically Signed   By: Rise Mu M.D.   On: 01/25/2022 19:08   MR BRAIN WO CONTRAST  Result Date: 01/25/2022 CLINICAL DATA:  Initial evaluation for acute neuro deficit, stroke suspected. EXAM: MRI HEAD WITHOUT CONTRAST MRA HEAD WITHOUT CONTRAST MRA NECK WITHOUT AND WITH  CONTRAST TECHNIQUE: Multiplanar, multi-echo pulse sequences of the brain and surrounding structures were acquired without intravenous contrast. Angiographic images of the Circle of Willis were acquired using MRA technique without intravenous contrast. Angiographic images of the neck were acquired using MRA technique without and with intravenous contrast. Carotid stenosis measurements (when applicable) are obtained utilizing NASCET criteria, using the distal internal carotid diameter as the denominator. CONTRAST:  10mL GADAVIST GADOBUTROL 1 MMOL/ML IV SOLN COMPARISON:  Prior head CT from earlier the same day. FINDINGS: MRI HEAD FINDINGS Brain: Cerebral  volume within normal limits. Patchy T2/FLAIR hyperintensity involving the periventricular and deep white matter both cerebral hemispheres as well as the pons, most likely related to chronic microvascular ischemic disease. Multiple scattered superimposed remote lacunar infarcts noted about the corpus callosum, periventricular white matter, deep gray nuclei, and pons. 9 mm focus of restricted diffusion seen involving the medial right thalamus (series 5, image 81). Additional 1.2 cm focus of restricted diffusion involving the inferior medial left cerebellum (series 5, image 63). Findings consistent with acute ischemic infarcts. Additional 2.1 cm focus of mild diffusion abnormality involving the right splenium (series 5, image 8). Mild patchy diffusion abnormality noted at the right basal ganglia/internal capsule (series 5, image 83). Findings felt to be most consistent with evolving subacute ischemic changes. Minimal petechial blood products noted about the few of these areas of ischemia without frank hemorrhagic transformation or significant mass effect. No other evidence for acute or subacute ischemia. Gray-white matter differentiation otherwise maintained. No areas of chronic cortical infarction. No acute intracranial hemorrhage. Few scattered chronic micro hemorrhages  noted, most pronounced at the pons, most likely hypertensive in nature. No mass lesion, midline shift or mass effect. No hydrocephalus or extra-axial fluid collection. Pituitary gland suprasellar region normal. Vascular: Major intracranial vascular flow voids are maintained. Skull and upper cervical spine: Craniocervical junction within normal limits. Bone marrow signal intensity diffusely decreased on T1 weighted sequence, nonspecific, but most commonly related to anemia, smoking or obesity. No focal marrow replacing lesion. No scalp soft tissue abnormality. Sinuses/Orbits: Right gaze noted. Globes orbital soft tissues demonstrate no other acute finding. Scattered mucosal thickening noted throughout the ethmoidal air cells. Paranasal sinuses are otherwise largely clear. No significant mastoid effusion. Other: None. MRA HEAD FINDINGS Anterior circulation: Both internal carotid arteries patent to the termini without significant stenosis. Mild atheromatous irregularity noted throughout the carotid siphons. A1 segments patent bilaterally. Left A1 hypoplastic. Normal anterior communicating artery complex. Anterior cerebral arteries patent without significant stenosis. No M1 stenosis or occlusion. Normal MCA bifurcations. No proximal MCA branch occlusion. Distal small vessel atheromatous irregularity seen throughout the MCA branches bilaterally. Posterior circulation: Both vertebral arteries patent to the vertebrobasilar junction without stenosis. Right vertebral artery slightly dominant. Both PICA patent. Basilar patent to its distal aspect without stenosis. Superior cerebellar and posterior cerebral arteries patent bilaterally. Distal small atheromatous changes noted within the distal PCA branches bilaterally. Anatomic variants: None significant.  No aneurysm. MRA NECK FINDINGS Aortic arch: Examination mildly degraded by motion artifact. Visualized aortic arch normal caliber with normal 3 vessel morphology. No stenosis  seen about the origin the great vessels. Right carotid system: Right common and internal carotid arteries patent without stenosis or evidence for dissection. No significant atheromatous change or narrowing about the right carotid bulb. Left carotid system: Left common and internal carotid arteries patent without stenosis or evidence for dissection. No significant atheromatous change or irregularity about the left carotid bulb. Vertebral arteries: Both vertebral arteries arise from subclavian arteries. No proximal subclavian artery stenosis. Right vertebral artery slightly dominant. Vertebral arteries widely patent without stenosis or evidence for dissection. Other: None IMPRESSION: MRI HEAD IMPRESSION: 1. Patchy small volume acute to early subacute ischemic infarcts involving the right thalamus, right basal ganglia, right splenium, and left cerebellum as above. 2. Underlying cerebral white matter disease with multiple probable superimposed remote lacunar infarcts as above. 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 Possible demyelinating disease could conceivably have this appearance  as well, and could be considered in the correct clinical setting. MRA HEAD IMPRESSION: 1. Negative intracranial MRA for large vessel occlusion. 2. Distal small vessel atheromatous irregularity. No hemodynamically significant or correctable stenosis. MRA NECK IMPRESSION: Wide patency of both carotid artery systems and vertebral arteries within the neck. Electronically Signed   By: Rise MuBenjamin  McClintock M.D.   On: 01/25/2022 19:08   MR BRAIN W CONTRAST  Result Date: 01/26/2022 CLINICAL DATA:  Vasculitis suspected, CNS.  Suspected demyelination. EXAM: MRI HEAD WITH CONTRAST TECHNIQUE: Multiplanar, multiecho pulse sequences of the brain and surrounding structures were obtained with intravenous contrast. CONTRAST:  10mL GADAVIST GADOBUTROL 1  MMOL/ML IV SOLN COMPARISON:  Noncontrast head MRI 01/25/2022 FINDINGS: Axial and coronal T1 postcontrast sequences were performed with the coronal sequence being mildly to moderately motion degraded. Non-masslike enhancement involving the anterior body of the right caudate nucleus corresponds to a region of diffusion and T2 signal abnormality on yesterday's noncontrast MRI and is most compatible with a subacute infarct. No abnormal intracranial enhancement is identified elsewhere. IMPRESSION: Enhancing subacute right basal ganglia infarct. Electronically Signed   By: Sebastian AcheAllen  Grady M.D.   On: 01/26/2022 11:26   MR CERVICAL SPINE W WO CONTRAST  Result Date: 01/26/2022 CLINICAL DATA:  Demyelinating disease. EXAM: MRI CERVICAL SPINE WITHOUT AND WITH CONTRAST TECHNIQUE: Multiplanar and multiecho pulse sequences of the cervical spine, to include the craniocervical junction and cervicothoracic junction, were obtained without and with intravenous contrast. CONTRAST:  10mL GADAVIST GADOBUTROL 1 MMOL/ML IV SOLN COMPARISON:  Head MRI 01/25/2022. FINDINGS: The study is motion degraded throughout, including moderate to severe motion on multiple axial sequences. Alignment: Normal. Vertebrae: No fracture, suspicious marrow lesion, or significant marrow edema. Cord: No convincing cord signal abnormality or abnormal enhancement identified within limitations of motion artifact. Posterior Fossa, vertebral arteries, paraspinal tissues: Lacunar infarcts in the pons as shown on the recent head MRI. Preserved vertebral artery flow voids. Disc levels: C2-3: Mild uncovertebral spurring and mild facet arthrosis result in at most mild right neural foraminal stenosis without spinal stenosis. C3-4: Disc bulging and uncovertebral spurring result in mild right neural foraminal stenosis without spinal stenosis. C4-5: Negative. C5-6: Disc bulging, a left paracentral disc extrusion, and uncovertebral spurring result in moderate spinal stenosis  with mild cord flattening and likely mild bilateral neural foraminal stenosis. C6-7: A small right paracentral disc protrusion results in at most mild spinal stenosis. Patent neural foramina. C7-T1: Negative. IMPRESSION: 1. Motion degraded examination. No evidence of demyelinating disease in the cervical spinal cord. 2. Cervical spondylosis most notable at C5-6 where there is moderate spinal stenosis. Electronically Signed   By: Sebastian AcheAllen  Grady M.D.   On: 01/26/2022 11:22   ECHOCARDIOGRAM COMPLETE  Result Date: 01/26/2022    ECHOCARDIOGRAM REPORT   Patient Name:   Timothy Pratt Date of Exam: 01/26/2022 Medical Rec #:  [id number]           Height:       71.0 in Accession #:    [id number]          Weight:       310.0 lb Date of Birth:  06/26/1976            BSA:          2.540 m Patient Age:    46 years            BP:           165/81 mmHg Patient Gender: M  HR:           63 bpm. Exam Location:  Inpatient Procedure: 2D Echo and Intracardiac Opacification Agent Indications:    Stroke  History:        Patient has no prior history of Echocardiogram examinations.                 Risk Factors:Hypertension.  Sonographer:    Eduard Roux Referring Phys: Herminio Heads  Sonographer Comments: Technically difficult study due to poor echo windows. IMPRESSIONS  1. Left ventricular ejection fraction, by estimation, is 55 to 60%. The left ventricle has normal function. The left ventricle has no regional wall motion abnormalities. There is mild concentric left ventricular hypertrophy. Left ventricular diastolic parameters are consistent with Grade II diastolic dysfunction (pseudonormalization).  2. Right ventricular systolic function is normal. The right ventricular size is normal. Tricuspid regurgitation signal is inadequate for assessing PA pressure.  3. Left atrial size was mild to moderately dilated.  4. The mitral valve is grossly normal. Trivial mitral valve regurgitation. No evidence of mitral  stenosis.  5. The aortic valve was not well visualized. Aortic valve regurgitation is not visualized. No aortic stenosis is present.  6. The inferior vena cava is dilated in size with <50% respiratory variability, suggesting right atrial pressure of 15 mmHg. Comparison(s): No prior Echocardiogram. Conclusion(s)/Recommendation(s): No intracardiac source of embolism detected on this transthoracic study. Consider a transesophageal echocardiogram to exclude cardiac source of embolism if clinically indicated. Technically challenging study, even with use of echo contrast. Grossly normal LVEF and wall motion, no severe valve disease detected. FINDINGS  Left Ventricle: Left ventricular ejection fraction, by estimation, is 55 to 60%. The left ventricle has normal function. The left ventricle has no regional wall motion abnormalities. Definity contrast agent was given IV to delineate the left ventricular  endocardial borders. The left ventricular internal cavity size was normal in size. There is mild concentric left ventricular hypertrophy. Left ventricular diastolic parameters are consistent with Grade II diastolic dysfunction (pseudonormalization). Right Ventricle: The right ventricular size is normal. Right vetricular wall thickness was not well visualized. Right ventricular systolic function is normal. Tricuspid regurgitation signal is inadequate for assessing PA pressure. Left Atrium: Left atrial size was mild to moderately dilated. Right Atrium: Right atrial size was normal in size. Pericardium: There is no evidence of pericardial effusion. Mitral Valve: The mitral valve is grossly normal. There is mild thickening of the mitral valve leaflet(s). There is mild calcification of the mitral valve leaflet(s). Trivial mitral valve regurgitation. No evidence of mitral valve stenosis. Tricuspid Valve: The tricuspid valve is grossly normal. Tricuspid valve regurgitation is not demonstrated. No evidence of tricuspid stenosis.  Aortic Valve: The aortic valve was not well visualized. Aortic valve regurgitation is not visualized. No aortic stenosis is present. Aortic valve peak gradient measures 6.7 mmHg. Pulmonic Valve: The pulmonic valve was not well visualized. Pulmonic valve regurgitation is not visualized. No evidence of pulmonic stenosis. Aorta: The aortic root, ascending aorta and aortic arch are all structurally normal, with no evidence of dilitation or obstruction. Venous: The inferior vena cava is dilated in size with less than 50% respiratory variability, suggesting right atrial pressure of 15 mmHg. IAS/Shunts: The atrial septum is grossly normal.  LEFT VENTRICLE PLAX 2D LVIDd:         5.29 cm Diastology LVIDs:         3.51 cm LV e' medial:    4.12 cm/s LV PW:  1.37 cm LV E/e' medial:  17.7 LV IVS:        1.39 cm LV e' lateral:   5.02 cm/s                        LV E/e' lateral: 14.6  RIGHT VENTRICLE RV Basal diam:  3.06 cm RV S prime:     11.20 cm/s TAPSE (M-mode): 2.1 cm LEFT ATRIUM             Index        RIGHT ATRIUM           Index LA diam:        3.60 cm 1.42 cm/m   RA Area:     15.70 cm LA Vol (A2C):   88.9 ml 35.00 ml/m  RA Volume:   41.50 ml  16.34 ml/m LA Vol (A4C):   78.7 ml 30.98 ml/m LA Biplane Vol: 92.2 ml 36.30 ml/m  AORTIC VALVE              PULMONIC VALVE AV Vmax:      129.00 cm/s PV Vmax:       1.06 m/s AV Peak Grad: 6.7 mmHg    PV Peak grad:  4.5 mmHg LVOT Vmax:    109.00 cm/s LVOT Vmean:   68.400 cm/s LVOT VTI:     0.215 m  AORTA Ao Root diam: 3.80 cm Ao Asc diam:  3.70 cm MITRAL VALVE MV Area (PHT): 4.60 cm    SHUNTS MV Decel Time: 165 msec    Systemic VTI: 0.22 m MV E velocity: 73.10 cm/s MV A velocity: 65.70 cm/s MV E/A ratio:  1.11 Jodelle Red MD Electronically signed by Jodelle Red MD Signature Date/Time: 01/26/2022/2:51:01 PM    Final      PHYSICAL EXAM  Temp:  [97.5 F (36.4 C)-98.8 F (37.1 C)] 97.6 F (36.4 C) (03/09 0845) Pulse Rate:  [63-80] 71 (03/09  0716) Resp:  [11-19] 11 (03/09 0716) BP: (156-179)/(81-99) 179/96 (03/09 0716) SpO2:  [94 %-100 %] 100 % (03/09 0716)  General - Morbid obesity, well developed, in no apparent distress.  Ophthalmologic - fundi not visualized due to noncooperation.  Cardiovascular - Regular rhythm and rate.  Mental Status -  Level of arousal and orientation to time, place, and person were intact. Language including expression, naming, repetition, comprehension was assessed and found intact. Attention span and concentration were normal. Fund of Knowledge was assessed and was intact.  Cranial Nerves II - XII - II - Visual field intact OU. III, IV, VI - Extraocular movements intact. V - Facial sensation intact bilaterally. VII - Facial movement intact bilaterally. VIII - Hearing & vestibular intact bilaterally except left gaze as unsustained nystagmus. X - Palate elevates symmetrically. XI - Chin turning & shoulder shrug intact bilaterally. XII - Tongue protrusion intact.  Motor Strength - The patients strength was normal in all extremities and pronator drift was absent.  Bulk was normal and fasciculations were absent.   Motor Tone - Muscle tone was assessed at the neck and appendages and was normal.  Reflexes - The patients reflexes were symmetrical in all extremities and he had no pathological reflexes.  Sensory - Light touch, temperature/pinprick were assessed and were symmetrical.    Coordination - The patient had normal movements in the hands and feet with no ataxia or dysmetria.  Tremor was absent.  Gait and Station - deferred.   ASSESSMENT/PLAN Timothy Pratt is a  46 y.o. male with history of morbid obesity, uncontrolled hypertension, OSA, smoker admitted for dizziness, diplopia with hypertension. No tPA given due to outside window.    Stroke: Multifocal subcortical infarcts, embolic versus synchronized small vessel disease source.  However, MS need to be further ruled out MRI  multifocal infarcts including right thalamic, right BG, right splenium, left cerebellum small infarcts.  However, cannot completely rule out MS MRA head and neck unremarkable MRI with contrast right BG/CR it has been but consistent with subacute infarct. MRI C-spine with and without contrast no significant finding 2D Echo 55 to 60% TEE minimal PFO with positive microbubble LE venous Doppler negative for DVT Consider 30-day cardiac event monitoring to rule out A-fib LDL 159 HgbA1c 4.8 Hypercoagulable work-up pending Lovenox for VTE prophylaxis No antithrombotic prior to admission, now on aspirin 81 mg daily and clopidogrel 75 mg daily DAPT for 3 weeks and then aspirin alone. Patient counseled to be compliant with his antithrombotic medications Ongoing aggressive stroke risk factor management Therapy recommendations: Outpatient PT Disposition: Likely home today  Hypertension Stable but on the high and Gradually normalized within 2-3 days. Long term BP goal normotensive Close PCP follow-up for better BP control  Hyperlipidemia Home meds: None LDL 159, goal < 70 Now on Lipitor 80 Continue statin at discharge  PFO Minimal PFO on TEE with micro bubble LE venous Doppler negative for DVT ROPE score 5 With many uncontrolled risk factors, this minimal PFO likely not the cause for his stroke Continue antiplatelet treatment  Tobacco abuse Current smoker, 1 pack/day Smoking cessation counseling provided Pt is willing to quit   Other Stroke Risk Factors Obesity, Body mass index is 43.23 kg/m.  Obstructive sleep apnea, on CPAP at home  Other Active Problems   Hospital day # 1  Neurology will sign off. Please call with questions. Pt will follow up with Dr. Epimenio Foot at [facility] in about 4 weeks to rule out MS. Thanks for the consult.   Marvel Plan, MD PhD Stroke Neurology 01/27/2022 3:38 PM    To contact Stroke Continuity provider, please refer to [email or web address]. After hours, contact  General Neurology

## 2022-01-27 NOTE — Discharge Summary (Signed)
Physician Discharge Summary   Patient: Timothy Pratt MRN: [id number] DOB: 1976-10-06  Admit date:     01/25/2022  Discharge date: 01/27/22  Discharge Physician: Kathlen Mody   PCP: Pcp, No   Recommendations at discharge:   Discharge Diagnoses: Principal Problem:   CVA (cerebral vascular accident) [facility]) Active Problems:   Diplopia   Hypertensive urgency   Hyperlipemia   Esophageal reflux    Hospital Course: Timothy Pratt is a 46 y.o. Caucasian male with medical history significant for essential hypertension, GERD and obstructive sleep apnea who presented to the emergency room with acute onset of diplopia, symptoms started 12 hours ago, prior to the ED visit. He also reports associated vertigo, nausea and vomiting.  He was admitted for stroke work up.   Assessment and Plan:  Acute to early subacute ischemic infarcts involving the right thalamus, right basal ganglia and right splenium and left cerebellum:  - admitted for stroke work up.  - MRI brain with contrast and C Spine to be ordered by neurology for evaluation of demyelination given abnormalities and atypical appearance on the MRI.  - Full hypercoagulable work up ordered.  - ECHO showed  preserved LVEF, no regional wall abnormalities, mild left ventricular hypertrophy and grade 2 diastolic dysfunction.   - TEE done , no vegetations.  - started the patient on aspirin 325 mg Daily and plavix 75 mg and lipitor 80 mg daily.  LDL is 159 Hemoglobin A1c is 4.8% Therapy evaluations recommending outpatient PT.      Hypertension:  Suboptimally controlled.  Restarted his home meds and added norvasc 5 mg daily to his regimen     GERD Stable.      OSA   recommend outpatient follow up with pulmonology.  . Consultants: NEUROLOGY Procedures performed: TEE  Disposition: Home Diet recommendation:  Discharge Diet Orders (From admission, onward)     Start     Ordered   01/27/22 0000  Diet - low sodium heart healthy         01/27/22 1647           Cardiac diet DISCHARGE MEDICATION: Allergies as of 01/27/2022   No Known Allergies      Medication List     STOP taking these medications    HYDROcodone-acetaminophen 5-325 MG tablet Commonly known as: NORCO/VICODIN   ibuprofen 800 MG tablet Commonly known as: ADVIL   NexIUM 24HR 20 MG capsule Generic drug: esomeprazole Replaced by: pantoprazole 40 MG tablet       TAKE these medications    Acetaminophen 500 MG capsule Take 500-1,000 mg by mouth daily as needed for pain (or headaches).   amLODipine 5 MG tablet Commonly known as: NORVASC Take 1 tablet (5 mg total) by mouth daily. Start taking on: January 28, 2022   aspirin 325 MG tablet Take 1 tablet (325 mg total) by mouth daily. Start taking on: January 28, 2022   atorvastatin 80 MG tablet Commonly known as: LIPITOR Take 1 tablet (80 mg total) by mouth daily. Start taking on: January 28, 2022   clopidogrel 75 MG tablet Commonly known as: PLAVIX Take 1 tablet (75 mg total) by mouth daily. Start taking on: January 28, 2022   lisinopril 40 MG tablet Commonly known as: ZESTRIL Take 1 tablet (40 mg total) by mouth daily. What changed:  medication strength how much to take   pantoprazole 40 MG tablet Commonly known as: PROTONIX Take 1 tablet (40 mg total) by mouth daily. Start taking on: January 28, 2022 Replaces: NexIUM 24HR 20 MG capsule        Follow-up Information     [facility] Call.   Specialty: Rehabilitation Why: Call for faster scheduling for outpatient PT. A referral has been placed in the computer for you. Contact information: [address] [id number] mc [place] [address] [phone]        [facility]. Call.   Why: follow up make an appointment to establish primary careThey have a pharmacy and finacial counselling Contact information: [address] [place] [address] [phone]        Asa Lente, MD. Schedule an appointment as soon as possible for a visit in 1 month(s).   Specialty: Neurology Contact information: [address] [place] [address] [phone]                Discharge Exam: Ceasar Mons Weights   01/26/22 0057  Weight: (!) 140.6 kg   General exam: Appears calm and comfortable  Respiratory system: Clear to auscultation. Respiratory effort normal. Cardiovascular system: S1 & S2 heard, RRR. No JVD,  No pedal edema. Gastrointestinal system: Abdomen is nondistended, soft and nontender.  Normal bowel sounds heard. Central nervous system: Alert and oriented. No focal neurological deficits. Extremities: Symmetric 5 x 5 power. Skin: No rashes, lesions or ulcers Psychiatry:  Mood & affect appropriate.    Condition at discharge: fair  The results of significant diagnostics from this hospitalization (including imaging, microbiology, ancillary and laboratory) are listed below for reference.   Imaging Studies: CT HEAD WO CONTRAST  Result Date: 01/25/2022 CLINICAL DATA:  Altered mental status, double vision EXAM: CT HEAD WITHOUT CONTRAST TECHNIQUE: Contiguous axial images were obtained from the base of the skull through the vertex without intravenous contrast. RADIATION DOSE REDUCTION: This exam was performed according to the departmental dose-optimization program which includes automated exposure control, adjustment of the mA and/or kV according to patient size and/or use of iterative reconstruction technique. COMPARISON:  Head CT 05/03/2008 FINDINGS: Brain: There are multiple foci of hypodensity in the bilateral frontal lobe periventricular white matter, left caudate head, left lentiform nucleus, and right splenium of the corpus callosum. These are consistent with multiple lacunar infarcts, favored remote but new since 2009. There is no definite evidence of acute infarct. There is no evidence of acute  intracranial hemorrhage or extra-axial fluid collection. Background parenchymal volume is normal. The ventricles are normal in size. There is no mass lesion.  There is no mass effect or midline shift. Vascular: No hyperdense vessel or unexpected calcification. Skull: Normal. Negative for fracture or focal lesion. Sinuses/Orbits: There is mild mucosal thickening in the paranasal sinuses. The globes and orbits are unremarkable. Other: None. IMPRESSION: 1. 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 Consider MRI to evaluate for acute infarct as indicated. 2. No acute intracranial hemorrhage or extra-axial fluid collection. Electronically Signed   By: Lesia Hausen M.D.   On: 01/25/2022 16:10   MR ANGIO HEAD WO CONTRAST  Result Date: 01/25/2022 CLINICAL DATA:  Initial evaluation for acute neuro deficit, stroke suspected. EXAM: MRI HEAD WITHOUT CONTRAST MRA HEAD WITHOUT CONTRAST MRA NECK WITHOUT AND WITH CONTRAST TECHNIQUE: Multiplanar, multi-echo pulse sequences of the brain and surrounding structures were acquired without intravenous contrast. Angiographic images of the Circle of Willis were acquired using MRA technique without intravenous contrast. Angiographic images of the neck were acquired using MRA technique without and  with intravenous contrast. Carotid stenosis measurements (when applicable) are obtained utilizing NASCET criteria, using the distal internal carotid diameter as the denominator. CONTRAST:  10mL GADAVIST GADOBUTROL 1 MMOL/ML IV SOLN COMPARISON:  Prior head CT from earlier the same day. FINDINGS: MRI HEAD FINDINGS Brain: Cerebral volume within normal limits. Patchy T2/FLAIR hyperintensity involving the periventricular and deep white matter both cerebral hemispheres as well as the pons, most likely related to chronic microvascular ischemic disease. Multiple scattered superimposed remote lacunar infarcts noted about the corpus  callosum, periventricular white matter, deep gray nuclei, and pons. 9 mm focus of restricted diffusion seen involving the medial right thalamus (series 5, image 81). Additional 1.2 cm focus of restricted diffusion involving the inferior medial left cerebellum (series 5, image 63). Findings consistent with acute ischemic infarcts. Additional 2.1 cm focus of mild diffusion abnormality involving the right splenium (series 5, image 8). Mild patchy diffusion abnormality noted at the right basal ganglia/internal capsule (series 5, image 83). Findings felt to be most consistent with evolving subacute ischemic changes. Minimal petechial blood products noted about the few of these areas of ischemia without frank hemorrhagic transformation or significant mass effect. No other evidence for acute or subacute ischemia. Gray-white matter differentiation otherwise maintained. No areas of chronic cortical infarction. No acute intracranial hemorrhage. Few scattered chronic micro hemorrhages noted, most pronounced at the pons, most likely hypertensive in nature. No mass lesion, midline shift or mass effect. No hydrocephalus or extra-axial fluid collection. Pituitary gland suprasellar region normal. Vascular: Major intracranial vascular flow voids are maintained. Skull and upper cervical spine: Craniocervical junction within normal limits. Bone marrow signal intensity diffusely decreased on T1 weighted sequence, nonspecific, but most commonly related to anemia, smoking or obesity. No focal marrow replacing lesion. No scalp soft tissue abnormality. Sinuses/Orbits: Right gaze noted. Globes orbital soft tissues demonstrate no other acute finding. Scattered mucosal thickening noted throughout the ethmoidal air cells. Paranasal sinuses are otherwise largely clear. No significant mastoid effusion. Other: None. MRA HEAD FINDINGS Anterior circulation: Both internal carotid arteries patent to the termini without significant stenosis. Mild  atheromatous irregularity noted throughout the carotid siphons. A1 segments patent bilaterally. Left A1 hypoplastic. Normal anterior communicating artery complex. Anterior cerebral arteries patent without significant stenosis. No M1 stenosis or occlusion. Normal MCA bifurcations. No proximal MCA branch occlusion. Distal small vessel atheromatous irregularity seen throughout the MCA branches bilaterally. Posterior circulation: Both vertebral arteries patent to the vertebrobasilar junction without stenosis. Right vertebral artery slightly dominant. Both PICA patent. Basilar patent to its distal aspect without stenosis. Superior cerebellar and posterior cerebral arteries patent bilaterally. Distal small atheromatous changes noted within the distal PCA branches bilaterally. Anatomic variants: None significant.  No aneurysm. MRA NECK FINDINGS Aortic arch: Examination mildly degraded by motion artifact. Visualized aortic arch normal caliber with normal 3 vessel morphology. No stenosis seen about the origin the great vessels. Right carotid system: Right common and internal carotid arteries patent without stenosis or evidence for dissection. No significant atheromatous change or narrowing about the right carotid bulb. Left carotid system: Left common and internal carotid arteries patent without stenosis or evidence for dissection. No significant atheromatous change or irregularity about the left carotid bulb. Vertebral arteries: Both vertebral arteries arise from subclavian arteries. No proximal subclavian artery stenosis. Right vertebral artery slightly dominant. Vertebral arteries widely patent without stenosis or evidence for dissection. Other: None IMPRESSION: MRI HEAD IMPRESSION: 1. Patchy small volume acute to early subacute ischemic infarcts involving the right thalamus, right basal ganglia, right splenium, and  left cerebellum as above. 2. Underlying cerebral white matter disease with multiple probable superimposed  remote lacunar infarcts as above. 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 Possible demyelinating disease could conceivably have this appearance as well, and could be considered in the correct clinical setting. MRA HEAD IMPRESSION: 1. Negative intracranial MRA for large vessel occlusion. 2. Distal small vessel atheromatous irregularity. No hemodynamically significant or correctable stenosis. MRA NECK IMPRESSION: Wide patency of both carotid artery systems and vertebral arteries within the neck. Electronically Signed   By: Rise Mu M.D.   On: 01/25/2022 19:08   MR Angiogram Neck W or Wo Contrast  Result Date: 01/25/2022 CLINICAL DATA:  Initial evaluation for acute neuro deficit, stroke suspected. EXAM: MRI HEAD WITHOUT CONTRAST MRA HEAD WITHOUT CONTRAST MRA NECK WITHOUT AND WITH CONTRAST TECHNIQUE: Multiplanar, multi-echo pulse sequences of the brain and surrounding structures were acquired without intravenous contrast. Angiographic images of the Circle of Willis were acquired using MRA technique without intravenous contrast. Angiographic images of the neck were acquired using MRA technique without and with intravenous contrast. Carotid stenosis measurements (when applicable) are obtained utilizing NASCET criteria, using the distal internal carotid diameter as the denominator. CONTRAST:  10mL GADAVIST GADOBUTROL 1 MMOL/ML IV SOLN COMPARISON:  Prior head CT from earlier the same day. FINDINGS: MRI HEAD FINDINGS Brain: Cerebral volume within normal limits. Patchy T2/FLAIR hyperintensity involving the periventricular and deep white matter both cerebral hemispheres as well as the pons, most likely related to chronic microvascular ischemic disease. Multiple scattered superimposed remote lacunar infarcts noted about the corpus callosum, periventricular white matter, deep gray nuclei, and pons. 9 mm focus of  restricted diffusion seen involving the medial right thalamus (series 5, image 81). Additional 1.2 cm focus of restricted diffusion involving the inferior medial left cerebellum (series 5, image 63). Findings consistent with acute ischemic infarcts. Additional 2.1 cm focus of mild diffusion abnormality involving the right splenium (series 5, image 8). Mild patchy diffusion abnormality noted at the right basal ganglia/internal capsule (series 5, image 83). Findings felt to be most consistent with evolving subacute ischemic changes. Minimal petechial blood products noted about the few of these areas of ischemia without frank hemorrhagic transformation or significant mass effect. No other evidence for acute or subacute ischemia. Gray-white matter differentiation otherwise maintained. No areas of chronic cortical infarction. No acute intracranial hemorrhage. Few scattered chronic micro hemorrhages noted, most pronounced at the pons, most likely hypertensive in nature. No mass lesion, midline shift or mass effect. No hydrocephalus or extra-axial fluid collection. Pituitary gland suprasellar region normal. Vascular: Major intracranial vascular flow voids are maintained. Skull and upper cervical spine: Craniocervical junction within normal limits. Bone marrow signal intensity diffusely decreased on T1 weighted sequence, nonspecific, but most commonly related to anemia, smoking or obesity. No focal marrow replacing lesion. No scalp soft tissue abnormality. Sinuses/Orbits: Right gaze noted. Globes orbital soft tissues demonstrate no other acute finding. Scattered mucosal thickening noted throughout the ethmoidal air cells. Paranasal sinuses are otherwise largely clear. No significant mastoid effusion. Other: None. MRA HEAD FINDINGS Anterior circulation: Both internal carotid arteries patent to the termini without significant stenosis. Mild atheromatous irregularity noted throughout the carotid siphons. A1 segments patent  bilaterally. Left A1 hypoplastic. Normal anterior communicating artery complex. Anterior cerebral arteries patent without significant stenosis. No M1 stenosis or occlusion. Normal MCA bifurcations. No proximal MCA branch occlusion. Distal small vessel atheromatous irregularity seen throughout the MCA branches  bilaterally. Posterior circulation: Both vertebral arteries patent to the vertebrobasilar junction without stenosis. Right vertebral artery slightly dominant. Both PICA patent. Basilar patent to its distal aspect without stenosis. Superior cerebellar and posterior cerebral arteries patent bilaterally. Distal small atheromatous changes noted within the distal PCA branches bilaterally. Anatomic variants: None significant.  No aneurysm. MRA NECK FINDINGS Aortic arch: Examination mildly degraded by motion artifact. Visualized aortic arch normal caliber with normal 3 vessel morphology. No stenosis seen about the origin the great vessels. Right carotid system: Right common and internal carotid arteries patent without stenosis or evidence for dissection. No significant atheromatous change or narrowing about the right carotid bulb. Left carotid system: Left common and internal carotid arteries patent without stenosis or evidence for dissection. No significant atheromatous change or irregularity about the left carotid bulb. Vertebral arteries: Both vertebral arteries arise from subclavian arteries. No proximal subclavian artery stenosis. Right vertebral artery slightly dominant. Vertebral arteries widely patent without stenosis or evidence for dissection. Other: None IMPRESSION: MRI HEAD IMPRESSION: 1. Patchy small volume acute to early subacute ischemic infarcts involving the right thalamus, right basal ganglia, right splenium, and left cerebellum as above. 2. Underlying cerebral white matter disease with multiple probable superimposed remote lacunar infarcts as above. Overall appearance of the brain is favored to  reflect that of chronic microvascular ischemic disease with multiple remote lacunar infarcts. Possible demyelinating disease could conceivably have this appearance as well, and could be considered in the correct clinical setting. MRA HEAD IMPRESSION: 1. Negative intracranial MRA for large vessel occlusion. 2. Distal small vessel atheromatous irregularity. No hemodynamically significant or correctable stenosis. MRA NECK IMPRESSION: Wide patency of both carotid artery systems and vertebral arteries within the neck. Electronically Signed   By: Rise MuBenjamin  McClintock M.D.   On: 01/25/2022 19:08   MR BRAIN WO CONTRAST  Result Date: 01/25/2022 CLINICAL DATA:  Initial evaluation for acute neuro deficit, stroke suspected. EXAM: MRI HEAD WITHOUT CONTRAST MRA HEAD WITHOUT CONTRAST MRA NECK WITHOUT AND WITH CONTRAST TECHNIQUE: Multiplanar, multi-echo pulse sequences of the brain and surrounding structures were acquired without intravenous contrast. Angiographic images of the Circle of Willis were acquired using MRA technique without intravenous contrast. Angiographic images of the neck were acquired using MRA technique without and with intravenous contrast. Carotid stenosis measurements (when applicable) are obtained utilizing NASCET criteria, using the distal internal carotid diameter as the denominator. CONTRAST:  10mL GADAVIST GADOBUTROL 1 MMOL/ML IV SOLN COMPARISON:  Prior head CT from earlier the same day. FINDINGS: MRI HEAD FINDINGS Brain: Cerebral volume within normal limits. Patchy T2/FLAIR hyperintensity involving the periventricular and deep white matter both cerebral hemispheres as well as the pons, most likely related to chronic microvascular ischemic disease. Multiple scattered superimposed remote lacunar infarcts noted about the corpus callosum, periventricular white matter, deep gray nuclei, and pons. 9 mm focus of restricted diffusion seen involving the medial right thalamus (series 5, image 81). Additional  1.2 cm focus of restricted diffusion involving the inferior medial left cerebellum (series 5, image 63). Findings consistent with acute ischemic infarcts. Additional 2.1 cm focus of mild diffusion abnormality involving the right splenium (series 5, image 8). Mild patchy diffusion abnormality noted at the right basal ganglia/internal capsule (series 5, image 83). Findings felt to be most consistent with evolving subacute ischemic changes. Minimal petechial blood products noted about the few of these areas of ischemia without frank hemorrhagic transformation or significant mass effect. No other evidence for acute or subacute ischemia. Gray-white matter differentiation otherwise maintained. No areas of  chronic cortical infarction. No acute intracranial hemorrhage. Few scattered chronic micro hemorrhages noted, most pronounced at the pons, most likely hypertensive in nature. No mass lesion, midline shift or mass effect. No hydrocephalus or extra-axial fluid collection. Pituitary gland suprasellar region normal. Vascular: Major intracranial vascular flow voids are maintained. Skull and upper cervical spine: Craniocervical junction within normal limits. Bone marrow signal intensity diffusely decreased on T1 weighted sequence, nonspecific, but most commonly related to anemia, smoking or obesity. No focal marrow replacing lesion. No scalp soft tissue abnormality. Sinuses/Orbits: Right gaze noted. Globes orbital soft tissues demonstrate no other acute finding. Scattered mucosal thickening noted throughout the ethmoidal air cells. Paranasal sinuses are otherwise largely clear. No significant mastoid effusion. Other: None. MRA HEAD FINDINGS Anterior circulation: Both internal carotid arteries patent to the termini without significant stenosis. Mild atheromatous irregularity noted throughout the carotid siphons. A1 segments patent bilaterally. Left A1 hypoplastic. Normal anterior communicating artery complex. Anterior cerebral  arteries patent without significant stenosis. No M1 stenosis or occlusion. Normal MCA bifurcations. No proximal MCA branch occlusion. Distal small vessel atheromatous irregularity seen throughout the MCA branches bilaterally. Posterior circulation: Both vertebral arteries patent to the vertebrobasilar junction without stenosis. Right vertebral artery slightly dominant. Both PICA patent. Basilar patent to its distal aspect without stenosis. Superior cerebellar and posterior cerebral arteries patent bilaterally. Distal small atheromatous changes noted within the distal PCA branches bilaterally. Anatomic variants: None significant.  No aneurysm. MRA NECK FINDINGS Aortic arch: Examination mildly degraded by motion artifact. Visualized aortic arch normal caliber with normal 3 vessel morphology. No stenosis seen about the origin the great vessels. Right carotid system: Right common and internal carotid arteries patent without stenosis or evidence for dissection. No significant atheromatous change or narrowing about the right carotid bulb. Left carotid system: Left common and internal carotid arteries patent without stenosis or evidence for dissection. No significant atheromatous change or irregularity about the left carotid bulb. Vertebral arteries: Both vertebral arteries arise from subclavian arteries. No proximal subclavian artery stenosis. Right vertebral artery slightly dominant. Vertebral arteries widely patent without stenosis or evidence for dissection. Other: None IMPRESSION: MRI HEAD IMPRESSION: 1. Patchy small volume acute to early subacute ischemic infarcts involving the right thalamus, right basal ganglia, right splenium, and left cerebellum as above. 2. Underlying cerebral white matter disease with multiple probable superimposed remote lacunar infarcts as above. 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 Possible  demyelinating disease could conceivably have this appearance as well, and could be considered in the correct clinical setting. MRA HEAD IMPRESSION: 1. Negative intracranial MRA for large vessel occlusion. 2. Distal small vessel atheromatous irregularity. No hemodynamically significant or correctable stenosis. MRA NECK IMPRESSION: Wide patency of both carotid artery systems and vertebral arteries within the neck. Electronically Signed   By: Rise Mu M.D.   On: 01/25/2022 19:08   MR BRAIN W CONTRAST  Result Date: 01/26/2022 CLINICAL DATA:  Vasculitis suspected, CNS.  Suspected demyelination. EXAM: MRI HEAD WITH CONTRAST TECHNIQUE: Multiplanar, multiecho pulse sequences of the brain and surrounding structures were obtained with intravenous contrast. CONTRAST:  29mL GADAVIST GADOBUTROL 1 MMOL/ML IV SOLN COMPARISON:  Noncontrast head MRI 01/25/2022 FINDINGS: Axial and coronal T1 postcontrast sequences were performed with the coronal sequence being mildly to moderately motion degraded. Non-masslike enhancement involving the anterior body of the right caudate nucleus corresponds to a region of diffusion and T2 signal abnormality on yesterday's noncontrast MRI and is most compatible  with a subacute infarct. No abnormal intracranial enhancement is identified elsewhere. IMPRESSION: Enhancing subacute right basal ganglia infarct. Electronically Signed   By: Sebastian Ache M.D.   On: 01/26/2022 11:26   MR CERVICAL SPINE W WO CONTRAST  Result Date: 01/26/2022 CLINICAL DATA:  Demyelinating disease. EXAM: MRI CERVICAL SPINE WITHOUT AND WITH CONTRAST TECHNIQUE: Multiplanar and multiecho pulse sequences of the cervical spine, to include the craniocervical junction and cervicothoracic junction, were obtained without and with intravenous contrast. CONTRAST:  61mL GADAVIST GADOBUTROL 1 MMOL/ML IV SOLN COMPARISON:  Head MRI 01/25/2022. FINDINGS: The study is motion degraded throughout, including moderate to severe motion  on multiple axial sequences. Alignment: Normal. Vertebrae: No fracture, suspicious marrow lesion, or significant marrow edema. Cord: No convincing cord signal abnormality or abnormal enhancement identified within limitations of motion artifact. Posterior Fossa, vertebral arteries, paraspinal tissues: Lacunar infarcts in the pons as shown on the recent head MRI. Preserved vertebral artery flow voids. Disc levels: C2-3: Mild uncovertebral spurring and mild facet arthrosis result in at most mild right neural foraminal stenosis without spinal stenosis. C3-4: Disc bulging and uncovertebral spurring result in mild right neural foraminal stenosis without spinal stenosis. C4-5: Negative. C5-6: Disc bulging, a left paracentral disc extrusion, and uncovertebral spurring result in moderate spinal stenosis with mild cord flattening and likely mild bilateral neural foraminal stenosis. C6-7: A small right paracentral disc protrusion results in at most mild spinal stenosis. Patent neural foramina. C7-T1: Negative. IMPRESSION: 1. Motion degraded examination. No evidence of demyelinating disease in the cervical spinal cord. 2. Cervical spondylosis most notable at C5-6 where there is moderate spinal stenosis. Electronically Signed   By: Sebastian Ache M.D.   On: 01/26/2022 11:22   ECHOCARDIOGRAM COMPLETE  Result Date: 01/26/2022    ECHOCARDIOGRAM REPORT   Patient Name:   Timothy Pratt Date of Exam: 01/26/2022 Medical Rec #:  [id number]           Height:       71.0 in Accession #:    [id number]          Weight:       310.0 lb Date of Birth:  [birth year]            BSA:          2.540 m Patient Age:    46 years            BP:           165/81 mmHg Patient Gender: M                   HR:           63 bpm. Exam Location:  Inpatient Procedure: 2D Echo and Intracardiac Opacification Agent Indications:    Stroke  History:        Patient has no prior history of Echocardiogram examinations.                 Risk Factors:Hypertension.   Sonographer:    Eduard Roux Referring Phys: Herminio Heads  Sonographer Comments: Technically difficult study due to poor echo windows. IMPRESSIONS  1. Left ventricular ejection fraction, by estimation, is 55 to 60%. The left ventricle has normal function. The left ventricle has no regional wall motion abnormalities. There is mild concentric left ventricular hypertrophy. Left ventricular diastolic parameters are consistent with Grade II diastolic dysfunction (pseudonormalization).  2. Right ventricular systolic function is normal. The right ventricular size is normal. Tricuspid regurgitation signal is inadequate for  assessing PA pressure.  3. Left atrial size was mild to moderately dilated.  4. The mitral valve is grossly normal. Trivial mitral valve regurgitation. No evidence of mitral stenosis.  5. The aortic valve was not well visualized. Aortic valve regurgitation is not visualized. No aortic stenosis is present.  6. The inferior vena cava is dilated in size with <50% respiratory variability, suggesting right atrial pressure of 15 mmHg. Comparison(s): No prior Echocardiogram. Conclusion(s)/Recommendation(s): No intracardiac source of embolism detected on this transthoracic study. Consider a transesophageal echocardiogram to exclude cardiac source of embolism if clinically indicated. Technically challenging study, even with use of echo contrast. Grossly normal LVEF and wall motion, no severe valve disease detected. FINDINGS  Left Ventricle: Left ventricular ejection fraction, by estimation, is 55 to 60%. The left ventricle has normal function. The left ventricle has no regional wall motion abnormalities. Definity contrast agent was given IV to delineate the left ventricular  endocardial borders. The left ventricular internal cavity size was normal in size. There is mild concentric left ventricular hypertrophy. Left ventricular diastolic parameters are consistent with Grade II diastolic dysfunction  (pseudonormalization). Right Ventricle: The right ventricular size is normal. Right vetricular wall thickness was not well visualized. Right ventricular systolic function is normal. Tricuspid regurgitation signal is inadequate for assessing PA pressure. Left Atrium: Left atrial size was mild to moderately dilated. Right Atrium: Right atrial size was normal in size. Pericardium: There is no evidence of pericardial effusion. Mitral Valve: The mitral valve is grossly normal. There is mild thickening of the mitral valve leaflet(s). There is mild calcification of the mitral valve leaflet(s). Trivial mitral valve regurgitation. No evidence of mitral valve stenosis. Tricuspid Valve: The tricuspid valve is grossly normal. Tricuspid valve regurgitation is not demonstrated. No evidence of tricuspid stenosis. Aortic Valve: The aortic valve was not well visualized. Aortic valve regurgitation is not visualized. No aortic stenosis is present. Aortic valve peak gradient measures 6.7 mmHg. Pulmonic Valve: The pulmonic valve was not well visualized. Pulmonic valve regurgitation is not visualized. No evidence of pulmonic stenosis. Aorta: The aortic root, ascending aorta and aortic arch are all structurally normal, with no evidence of dilitation or obstruction. Venous: The inferior vena cava is dilated in size with less than 50% respiratory variability, suggesting right atrial pressure of 15 mmHg. IAS/Shunts: The atrial septum is grossly normal.  LEFT VENTRICLE PLAX 2D LVIDd:         5.29 cm Diastology LVIDs:         3.51 cm LV e' medial:    4.12 cm/s LV PW:         1.37 cm LV E/e' medial:  17.7 LV IVS:        1.39 cm LV e' lateral:   5.02 cm/s                        LV E/e' lateral: 14.6  RIGHT VENTRICLE RV Basal diam:  3.06 cm RV S prime:     11.20 cm/s TAPSE (M-mode): 2.1 cm LEFT ATRIUM             Index        RIGHT ATRIUM           Index LA diam:        3.60 cm 1.42 cm/m   RA Area:     15.70 cm LA Vol (A2C):   88.9 ml 35.00  ml/m  RA Volume:   41.50 ml  16.34 ml/m LA  Vol (A4C):   78.7 ml 30.98 ml/m LA Biplane Vol: 92.2 ml 36.30 ml/m  AORTIC VALVE              PULMONIC VALVE AV Vmax:      129.00 cm/s PV Vmax:       1.06 m/s AV Peak Grad: 6.7 mmHg    PV Peak grad:  4.5 mmHg LVOT Vmax:    109.00 cm/s LVOT Vmean:   68.400 cm/s LVOT VTI:     0.215 m  AORTA Ao Root diam: 3.80 cm Ao Asc diam:  3.70 cm MITRAL VALVE MV Area (PHT): 4.60 cm    SHUNTS MV Decel Time: 165 msec    Systemic VTI: 0.22 m MV E velocity: 73.10 cm/s MV A velocity: 65.70 cm/s MV E/A ratio:  1.11 Jodelle Red MD Electronically signed by Jodelle Red MD Signature Date/Time: 01/26/2022/2:51:01 PM    Final    VAS [place] LOWER EXTREMITY VENOUS (DVT)  Result Date: 01/27/2022  Lower Venous DVT Study Patient Name:  Timothy Pratt  Date of Exam:   01/27/2022 Medical Rec #: [id number]            Accession #:    [id number] Date of Birth: [birth year]             Patient Gender: M Patient Age:   59 years Exam Location:  [facility] Procedure:      VAS [place] LOWER EXTREMITY VENOUS (DVT) Referring Phys: Scheryl Marten XU --------------------------------------------------------------------------------  Indications: Stroke.  Comparison Study: No prior study on file Performing Technologist: Sherren Kerns RVS  Examination Guidelines: A complete evaluation includes B-mode imaging, spectral Doppler, color Doppler, and power Doppler as needed of all accessible portions of each vessel. Bilateral testing is considered an integral part of a complete examination. Limited examinations for reoccurring indications may be performed as noted. The reflux portion of the exam is performed with the patient in reverse Trendelenburg.  +---------+---------------+---------+-----------+----------+--------------+  RIGHT     Compressibility Phasicity Spontaneity Properties Thrombus Aging  +---------+---------------+---------+-----------+----------+--------------+  CFV       Full            Yes        Yes                                    +---------+---------------+---------+-----------+----------+--------------+  SFJ       Full                                                             +---------+---------------+---------+-----------+----------+--------------+  FV Prox   Full                                                             +---------+---------------+---------+-----------+----------+--------------+  FV Mid    Full                                                             +---------+---------------+---------+-----------+----------+--------------+  FV Distal Full                                                             +---------+---------------+---------+-----------+----------+--------------+  PFV       Full                                                             +---------+---------------+---------+-----------+----------+--------------+  POP       Full            Yes       Yes                                    +---------+---------------+---------+-----------+----------+--------------+  PTV       Full                                                             +---------+---------------+---------+-----------+----------+--------------+  PERO      Full                                                             +---------+---------------+---------+-----------+----------+--------------+   +---------+---------------+---------+-----------+----------+--------------+  LEFT      Compressibility Phasicity Spontaneity Properties Thrombus Aging  +---------+---------------+---------+-----------+----------+--------------+  CFV       Full            Yes       Yes                                    +---------+---------------+---------+-----------+----------+--------------+  SFJ       Full                                                             +---------+---------------+---------+-----------+----------+--------------+  FV Prox   Full                                                              +---------+---------------+---------+-----------+----------+--------------+  FV Mid    Full                                                             +---------+---------------+---------+-----------+----------+--------------+  FV Distal Full                                                             +---------+---------------+---------+-----------+----------+--------------+  PFV       Full                                                             +---------+---------------+---------+-----------+----------+--------------+  POP       Full            Yes       Yes                                    +---------+---------------+---------+-----------+----------+--------------+  PTV       Full                                                             +---------+---------------+---------+-----------+----------+--------------+  PERO      Full                                                             +---------+---------------+---------+-----------+----------+--------------+     Summary: BILATERAL: - No evidence of deep vein thrombosis seen in the lower extremities, bilaterally. -   *See table(s) above for measurements and observations.    Preliminary     Microbiology: Results for orders placed or performed during the hospital encounter of 01/25/22  Resp Panel by RT-PCR (Flu A&B, Covid) Nasopharyngeal Swab     Status: None   Collection Time: 01/25/22  3:27 PM   Specimen: Nasopharyngeal Swab; Nasopharyngeal(NP) swabs in vial transport medium  Result Value Ref Range Status   SARS Coronavirus 2 by RT PCR NEGATIVE NEGATIVE Final    Comment: (NOTE) SARS-CoV-2 target nucleic acids are NOT DETECTED.  The SARS-CoV-2 RNA is generally detectable in upper respiratory specimens during the acute phase of infection. The lowest concentration of SARS-CoV-2 viral copies this assay can detect is 138 copies/mL. A negative result does not preclude SARS-Cov-2 infection and should not be used as the sole basis for treatment  or other patient management decisions. A negative result may occur with  improper specimen collection/handling, submission of specimen other than nasopharyngeal swab, presence of viral mutation(s) within the areas targeted by this assay, and inadequate number of viral copies(<138 copies/mL). A negative result must be combined with clinical observations, patient history, and epidemiological information. The expected result is Negative.  Fact Sheet for Patients:  BloggerCourse.com  Fact Sheet for Healthcare Providers:  SeriousBroker.it  This test is no t yet approved or cleared by the [place] FDA and  has been authorized for detection and/or diagnosis  of SARS-CoV-2 by FDA under an Emergency Use Authorization (EUA). This EUA will remain  in effect (meaning this test can be used) for the duration of the COVID-19 declaration under Section 564(b)(1) of the Act, 21 U.S.C.section 360bbb-3(b)(1), unless the authorization is terminated  or revoked sooner.       Influenza A by PCR NEGATIVE NEGATIVE Final   Influenza B by PCR NEGATIVE NEGATIVE Final    Comment: (NOTE) The Xpert Xpress SARS-CoV-2/FLU/RSV plus assay is intended as an aid in the diagnosis of influenza from Nasopharyngeal swab specimens and should not be used as a sole basis for treatment. Nasal washings and aspirates are unacceptable for Xpert Xpress SARS-CoV-2/FLU/RSV testing.  Fact Sheet for Patients: BloggerCourse.com  Fact Sheet for Healthcare Providers: SeriousBroker.it  This test is not yet approved or cleared by the [place] FDA and has been authorized for detection and/or diagnosis of SARS-CoV-2 by FDA under an Emergency Use Authorization (EUA). This EUA will remain in effect (meaning this test can be used) for the duration of the COVID-19 declaration under Section 564(b)(1) of the Act, 21 U.S.C. section  360bbb-3(b)(1), unless the authorization is terminated or revoked.  Performed at [facility], [address], [place], [place] [address]     Labs: CBC: Recent Labs  Lab 01/25/22 1112 01/26/22 0105  WBC 12.1* 11.8*  NEUTROABS 9.4*  --   HGB 17.9* 16.9  HCT 51.8 49.3  MCV 88.1 88.2  PLT 203 188   Basic Metabolic Panel: Recent Labs  Lab 01/25/22 1112 01/26/22 0105  NA 137  --   K 4.1  --   CL 103  --   CO2 25  --   GLUCOSE 133*  --   BUN 7  --   CREATININE 0.86 1.02  CALCIUM 8.9  --    Liver Function Tests: Recent Labs  Lab 01/25/22 1550  AST 16  ALT 15  ALKPHOS 66  BILITOT 0.9  PROT 6.9  ALBUMIN 3.7   CBG: No results for input(s): GLUCAP in the last 168 hours.  Discharge time spent: 45 minutes  Signed: Kathlen Mody, MD [facility] 01/27/2022

## 2022-01-27 NOTE — Plan of Care (Signed)
New Medication given for BP. ? ?Problem: Education: ?Goal: Knowledge of General Education information will improve ?Description: Including pain rating scale, medication(s)/side effects and non-pharmacologic comfort measures ?Outcome: Adequate for Discharge ?  ?Problem: Health Behavior/Discharge Planning: ?Goal: Ability to manage health-related needs will improve ?Outcome: Adequate for Discharge ?  ?Problem: Clinical Measurements: ?Goal: Ability to maintain clinical measurements within normal limits will improve ?Outcome: Adequate for Discharge ?Goal: Will remain free from infection ?Outcome: Adequate for Discharge ?Goal: Diagnostic test results will improve ?Outcome: Adequate for Discharge ?Goal: Respiratory complications will improve ?Outcome: Adequate for Discharge ?Goal: Cardiovascular complication will be avoided ?Outcome: Adequate for Discharge ?  ?Problem: Activity: ?Goal: Risk for activity intolerance will decrease ?Outcome: Adequate for Discharge ?  ?Problem: Nutrition: ?Goal: Adequate nutrition will be maintained ?Outcome: Adequate for Discharge ?  ?Problem: Coping: ?Goal: Level of anxiety will decrease ?Outcome: Adequate for Discharge ?  ?Problem: Elimination: ?Goal: Will not experience complications related to bowel motility ?Outcome: Adequate for Discharge ?Goal: Will not experience complications related to urinary retention ?Outcome: Adequate for Discharge ?  ?Problem: Pain Managment: ?Goal: General experience of comfort will improve ?Outcome: Adequate for Discharge ?  ?Problem: Safety: ?Goal: Ability to remain free from injury will improve ?Outcome: Adequate for Discharge ?  ?Problem: Skin Integrity: ?Goal: Risk for impaired skin integrity will decrease ?Outcome: Adequate for Discharge ?  ?Problem: Education: ?Goal: Knowledge of disease or condition will improve ?Outcome: Adequate for Discharge ?Goal: Knowledge of secondary prevention will improve (SELECT ALL) ?Outcome: Adequate for Discharge ?Goal:  Knowledge of patient specific risk factors will improve (INDIVIDUALIZE FOR PATIENT) ?Outcome: Adequate for Discharge ?Goal: Individualized Educational Video(s) ?Outcome: Adequate for Discharge ?  ?Problem: Coping: ?Goal: Will verbalize positive feelings about self ?Outcome: Adequate for Discharge ?Goal: Will identify appropriate support needs ?Outcome: Adequate for Discharge ?  ?Problem: Health Behavior/Discharge Planning: ?Goal: Ability to manage health-related needs will improve ?Outcome: Adequate for Discharge ?  ?Problem: Self-Care: ?Goal: Ability to participate in self-care as condition permits will improve ?Outcome: Adequate for Discharge ?Goal: Verbalization of feelings and concerns over difficulty with self-care will improve ?Outcome: Adequate for Discharge ?Goal: Ability to communicate needs accurately will improve ?Outcome: Adequate for Discharge ?  ?Problem: Nutrition: ?Goal: Risk of aspiration will decrease ?Outcome: Adequate for Discharge ?Goal: Dietary intake will improve ?Outcome: Adequate for Discharge ?  ?Problem: Intracerebral Hemorrhage Tissue Perfusion: ?Goal: Complications of Intracerebral Hemorrhage will be minimized ?Outcome: Adequate for Discharge ?  ?Problem: Ischemic Stroke/TIA Tissue Perfusion: ?Goal: Complications of ischemic stroke/TIA will be minimized ?Outcome: Adequate for Discharge ?  ?Problem: Spontaneous Subarachnoid Hemorrhage Tissue Perfusion: ?Goal: Complications of Spontaneous Subarachnoid Hemorrhage will be minimized ?Outcome: Adequate for Discharge ?  ?Problem: Acute Rehab PT Goals(only PT should resolve) ?Goal: Pt Will Ambulate ?Outcome: Adequate for Discharge ?Goal: Pt Will Go Up/Down Stairs ?Outcome: Adequate for Discharge ?Goal: PT Additional Goal #1 ?Outcome: Adequate for Discharge ?  ?Problem: Acute Rehab OT Goals (only OT should resolve) ?Goal: OT Additional ADL Goal #1 ?Outcome: Adequate for Discharge ?Goal: OT Additional ADL Goal #2 ?Outcome: Adequate for Discharge ?   ?

## 2022-01-27 NOTE — Progress Notes (Signed)
?  Echocardiogram ?Echocardiogram Transesophageal has been performed. ? ?Timothy Pratt ?01/27/2022, 8:51 AM ?

## 2022-01-27 NOTE — Anesthesia Procedure Notes (Signed)
Procedure Name: [place] ?Date/Time: 01/27/2022 8:33 AM ?Performed by: Lavell Luster, CRNA ?Pre-anesthesia Checklist: Patient identified, Emergency Drugs available, Suction available, Patient being monitored and Timeout performed ?Patient Re-evaluated:Patient Re-evaluated prior to induction ?Oxygen Delivery Method: Nasal cannula ?Preoxygenation: Pre-oxygenation with 100% oxygen ?Induction Type: IV induction ?Placement Confirmation: breath sounds checked- equal and bilateral and positive ETCO2 ?Dental Injury: Teeth and Oropharynx as per pre-operative assessment  ? ? ? ? ?

## 2022-01-27 NOTE — Interval H&P Note (Signed)
History and Physical Interval Note: ? ?01/27/2022 ?8:18 AM ? ?Timothy Pratt  has presented today for surgery, with the diagnosis of STROKE.  The various methods of treatment have been discussed with the patient and family. After consideration of risks, benefits and other options for treatment, the patient has consented to  Procedure(s): ?TRANSESOPHAGEAL ECHOCARDIOGRAM (TEE) (N/A) as a surgical intervention.  The patient's history has been reviewed, patient examined, no change in status, stable for surgery.  I have reviewed the patient's chart and labs.  Questions were answered to the patient's satisfaction.   ? ? ?Lisette Abu Timothy Pratt ? ? ?

## 2022-01-27 NOTE — CV Procedure (Signed)
? ?TRANSESOPHAGEAL ECHOCARDIOGRAM (TEE) NOTE ? ?INDICATIONS: ?Cryptogenic stroke ? ?PROCEDURE:  ? ?Informed consent was obtained prior to the procedure. The risks, benefits and alternatives for the procedure were discussed and the patient comprehended these risks.  Risks include, but are not limited to, cough, sore throat, vomiting, nausea, somnolence, esophageal and stomach trauma or perforation, bleeding, low blood pressure, aspiration, pneumonia, infection, trauma to the teeth and death.   ? ?After a procedural time-out, the patient was given propofol for  sedation by anesthesia.  The patient's heart rate, blood pressure, and oxygen saturation are monitored continuously during the procedure.The oropharynx was anesthetized with 2 topical cetacaine sprays.  The transesophageal probe was inserted in the esophagus and stomach without difficulty and multiple views were obtained.  The patient was kept under observation until the patient left the procedure room.   I was present face-to-face 100% of this time. The patient left the procedure room in stable condition.  ? ?Agitated microbubble saline contrast was administered. ? ?COMPLICATIONS:   ? ?There were no immediate complications. ? ?Findings: ? ?LEFT VENTRICLE: The left ventricular wall thickness is mildly increased.  The left ventricular cavity is normal in size. Wall motion is normal.  LVEF is 55-60%. ? ?RIGHT VENTRICLE:  The right ventricle is normal in structure and function without any thrombus or masses.   ? ?LEFT ATRIUM:  The left atrium is moderately dilated in size without any thrombus or masses.  There is not spontaneous echo contrast ("smoke") in the left atrium consistent with a low flow state. ? ?LEFT ATRIAL APPENDAGE:  The left atrial appendage is free of any thrombus or masses. The appendage has single lobes. Pulse doppler indicates moderate flow in the appendage. ? ?ATRIAL SEPTUM:  The atrial septum appears intact and is free of thrombus and/or  masses.  There is evidence for a small PFO with right to left interatrial shunting by saline microbubble. ? ?RIGHT ATRIUM:  The right atrium is normal in size and function without any thrombus or masses. ? ?MITRAL VALVE:  The mitral valve is grossly normal in structure and function with  trivial  regurgitation.  There were no vegetations or stenosis. ? ?AORTIC VALVE:  The aortic valve is trileaflet, normal in structure and function with  trivial central  regurgitation.  There were no vegetations or stenosis ? ?TRICUSPID VALVE:  The tricuspid valve is normal in structure and function with  trivial  regurgitation.  There were no vegetations or stenosis ? ? PULMONIC VALVE:  The pulmonic valve is normal in structure and function with  none  regurgitation.  There were no vegetations or stenosis. ?  ?AORTIC ARCH, ASCENDING AND DESCENDING AORTA:  There was no Myrtis Ser et. Al, 1992) atherosclerosis of the ascending aorta, aortic arch, or proximal descending aorta. ? ?12. PULMONARY VEINS: Anomalous pulmonary venous return was not noted. ? ?13. PERICARDIUM: The pericardium appeared normal and non-thickened.  There is no pericardial effusion. ? ?IMPRESSION:  ? ?Small PFO with right to left shunting by saline microbubble contrast ?Moderate LAE ?NO LAA thrombus ?Trivial AI ?LVEF 55-60% ? ?RECOMMENDATIONS:   ? ? PFO was small and not visualized, but contrast bubbles noted in the left atrium within a a few beats, suggestive of PFO. Consider evaluation for device closure given young age. ? ?Time Spent Directly with the Patient: ? ?45 minutes  ? ?Chrystie Nose, MD, [facility], FACP  ?[place]  [facility]  ?Medical Director of the [facility] &  ?Cardiovascular Risk Reduction  Clinic ?Diplomate of the [employer] of Clinical Lipidology ?Attending Cardiologist  ?Direct Dial: [phone]  Fax: [phone]  ?Website:  [email or web address] ? ?Nadean Corwin Taia Bramlett ?01/27/2022, 9:00 AM ? ? ? ?

## 2022-01-27 NOTE — Anesthesia Postprocedure Evaluation (Signed)
Anesthesia Post Note ? ?Patient: Timothy Pratt ? ?Procedure(s) Performed: TRANSESOPHAGEAL ECHOCARDIOGRAM (TEE) ?BUBBLE STUDY ? ?  ? ?Patient location during evaluation: PACU ?Anesthesia Type: MAC ?Level of consciousness: awake and alert ?Pain management: pain level controlled ?Vital Signs Assessment: post-procedure vital signs reviewed and stable ?Respiratory status: spontaneous breathing, nonlabored ventilation, respiratory function stable and patient connected to nasal cannula oxygen ?Cardiovascular status: stable and blood pressure returned to baseline ?Postop Assessment: no apparent nausea or vomiting ?Anesthetic complications: no ? ? ?No notable events documented. ? ?Last Vitals:  ?Vitals:  ? 01/27/22 0716 01/27/22 0845  ?BP: (!) 179/96   ?Pulse: 71   ?Resp: 11   ?Temp: (!) 36.4 ?C 36.4 ?C  ?SpO2: 100%   ?  ?Last Pain:  ?Vitals:  ? 01/27/22 1000  ?TempSrc:   ?PainSc: 0-No pain  ? ? ?  ?  ?  ?  ?  ?  ? ?Timothy Pratt ? ? ? ? ?

## 2022-01-28 ENCOUNTER — Encounter: Payer: Self-pay | Admitting: *Deleted

## 2022-01-28 ENCOUNTER — Other Ambulatory Visit: Payer: Self-pay | Admitting: Cardiology

## 2022-01-28 ENCOUNTER — Telehealth: Payer: Self-pay | Admitting: Cardiology

## 2022-01-28 DIAGNOSIS — H532 Diplopia: Secondary | ICD-10-CM

## 2022-01-28 DIAGNOSIS — I639 Cerebral infarction, unspecified: Secondary | ICD-10-CM

## 2022-01-28 DIAGNOSIS — I4891 Unspecified atrial fibrillation: Secondary | ICD-10-CM

## 2022-01-28 DIAGNOSIS — R42 Dizziness and giddiness: Secondary | ICD-10-CM

## 2022-01-28 LAB — CARDIOLIPIN ANTIBODIES, IGG, IGM, IGA
Anticardiolipin IgA: <9 APL U/mL (ref 0–11)
Anticardiolipin IgG: <9 GPL U/mL (ref 0–14)
Anticardiolipin IgM: <9 MPL U/mL (ref 0–12)

## 2022-01-28 LAB — PROTEIN C, TOTAL: Protein C, Total: 107 % (ref 60–150)

## 2022-01-28 LAB — BETA-2-GLYCOPROTEIN I ABS, IGG/M/A
Beta-2 Glyco I IgG: <9 GPI IgG units (ref 0–20)
Beta-2-Glycoprotein I IgA: <9 GPI IgA units (ref 0–25)
Beta-2-Glycoprotein I IgM: <9 GPI IgM units (ref 0–32)

## 2022-01-28 NOTE — Progress Notes (Signed)
Patient ID: Timothy Pratt, male   DOB: May 11, 1976, 46 y.o.   MRN: [id number] ?Patient enrolled for [employer] to ship a 30 day cardiac event monitor to his address on file. ?Letter with instructions mailed to patient. ?

## 2022-01-28 NOTE — Telephone Encounter (Signed)
Pt needs 30 day event monitor for cryptogenic stroke  ?Then needs follow up with Dr. Johney Frame please arrange thanks. ?

## 2022-01-31 LAB — PROTHROMBIN GENE MUTATION

## 2022-01-31 LAB — FACTOR 5 LEIDEN

## 2022-02-03 ENCOUNTER — Telehealth: Payer: Self-pay | Admitting: Cardiology

## 2022-02-03 NOTE — Telephone Encounter (Signed)
Appointment scheduled for 03/10/22 with Dr. Shari Prows per Nada Boozer, NP ? ?"Leone Brand, NP  [facility] [address] Scheduling; Andee Lineman A ?Pt needs 30 day event monitor for stroke.  He was discharged yesterday  ? ?He needs follow up as new pt with Dr. Shari Prows  in 45 days or so.  Thank you" ?

## 2022-02-04 ENCOUNTER — Ambulatory Visit: Payer: Self-pay | Admitting: Physical Therapy

## 2022-02-07 ENCOUNTER — Other Ambulatory Visit (HOSPITAL_COMMUNITY): Payer: Self-pay

## 2022-02-07 ENCOUNTER — Telehealth (HOSPITAL_COMMUNITY): Payer: Self-pay

## 2022-02-07 NOTE — Telephone Encounter (Signed)
Transitions of Care Pharmacy   Call attempted for a pharmacy transitions of care follow-up. Unable to leave voicemail.  Call attempt #1. Will follow-up in 2-3 days.    

## 2022-02-08 ENCOUNTER — Telehealth (HOSPITAL_COMMUNITY): Payer: Self-pay

## 2022-02-08 NOTE — Telephone Encounter (Signed)
Transitions of Care Pharmacy   Call attempted for a pharmacy transitions of care follow-up. Unable to leave voicemail.   Call attempt #2. Will follow-up in 2-3 days.    

## 2022-02-09 ENCOUNTER — Telehealth (HOSPITAL_COMMUNITY): Payer: Self-pay

## 2022-02-09 NOTE — Telephone Encounter (Signed)
Transitions of Care Pharmacy   Call attempted for a pharmacy transitions of care follow-up. Unable to leave voicemail.  Call attempt #3. Will no longer attempt follow up for TOC pharmacy   

## 2022-02-11 ENCOUNTER — Ambulatory Visit: Payer: Self-pay | Attending: Neurology

## 2022-02-16 ENCOUNTER — Other Ambulatory Visit (HOSPITAL_COMMUNITY): Payer: Self-pay

## 2022-02-17 ENCOUNTER — Other Ambulatory Visit (HOSPITAL_COMMUNITY): Payer: Self-pay

## 2022-02-28 ENCOUNTER — Other Ambulatory Visit (HOSPITAL_COMMUNITY): Payer: Self-pay
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
## 2022-03-10 ENCOUNTER — Ambulatory Visit: Payer: Self-pay | Admitting: Cardiology

## 2022-03-23 NOTE — Telephone Encounter (Signed)
Called pt to resch referral appt.  Pt stated he never received the heart monitor.  He would like to know if it was sent?   ? ?Best number [phone] ?

## 2022-03-24 ENCOUNTER — Telehealth: Payer: Self-pay | Admitting: *Deleted

## 2022-03-24 NOTE — Telephone Encounter (Signed)
Attempted to contact patient regarding cardiac event monitor, no answer, no voicemail. ? ?Patient questioned if monitor ever sent when attempt was made to schedule referral follow up. ? ?On 01/28/22, patient was enrolled for [employer] to ship a 30 day cardiac event monitor to his address on file.  Letter with monitor instruction and self pay cost , self pay discount option was mailed to patient. ?Patient requested a benefits quote from [employer] but did not take the calls when they called him back with that information. ?On 02/03/22 [employer] cancelled the enrollment. ? ?If patient is contacted and wants to continue with cardiac event monitor, the self pay discounted cost would be $225.00 if the patient contacts [employer] within 7 days of applying the monitor with payment arrangements, otherwise the cost would be $350.00.  We would need to send [employer] a new enrollment. ? ?

## 2022-03-25 ENCOUNTER — Telehealth: Payer: Self-pay | Admitting: Cardiology

## 2022-03-25 NOTE — Telephone Encounter (Signed)
Pt was sent info about 30 day event monitor but never responded or returned calls.  I notified Dr. Roda Shutters his neurologist.   ?

## 2022-03-26 ENCOUNTER — Other Ambulatory Visit: Payer: Self-pay | Admitting: Neurology

## 2022-03-26 DIAGNOSIS — I639 Cerebral infarction, unspecified: Secondary | ICD-10-CM

## 2022-04-11 ENCOUNTER — Other Ambulatory Visit (HOSPITAL_COMMUNITY): Payer: Self-pay

## 2022-04-19 ENCOUNTER — Other Ambulatory Visit (HOSPITAL_COMMUNITY): Payer: Self-pay

## 2022-05-09 ENCOUNTER — Other Ambulatory Visit (HOSPITAL_COMMUNITY): Payer: Self-pay

## 2022-05-13 ENCOUNTER — Other Ambulatory Visit (HOSPITAL_COMMUNITY): Payer: Self-pay

## 2022-05-17 ENCOUNTER — Other Ambulatory Visit (HOSPITAL_COMMUNITY): Payer: Self-pay

## 2022-05-18 ENCOUNTER — Other Ambulatory Visit (HOSPITAL_COMMUNITY): Payer: Self-pay

## 2022-05-20 ENCOUNTER — Other Ambulatory Visit (HOSPITAL_COMMUNITY): Payer: Self-pay

## 2022-05-27 ENCOUNTER — Other Ambulatory Visit (HOSPITAL_COMMUNITY): Payer: Self-pay

## 2022-06-05 ENCOUNTER — Other Ambulatory Visit (HOSPITAL_COMMUNITY): Payer: Self-pay

## 2022-06-10 ENCOUNTER — Other Ambulatory Visit (HOSPITAL_COMMUNITY): Payer: Self-pay

## 2022-06-15 ENCOUNTER — Other Ambulatory Visit (HOSPITAL_COMMUNITY): Payer: Self-pay

## 2022-06-16 ENCOUNTER — Other Ambulatory Visit (HOSPITAL_COMMUNITY): Payer: Self-pay

## 2022-06-20 ENCOUNTER — Other Ambulatory Visit (HOSPITAL_COMMUNITY): Payer: Self-pay

## 2022-06-21 ENCOUNTER — Other Ambulatory Visit (HOSPITAL_COMMUNITY): Payer: Self-pay

## 2022-06-22 ENCOUNTER — Other Ambulatory Visit (HOSPITAL_COMMUNITY): Payer: Self-pay

## 2022-06-23 ENCOUNTER — Other Ambulatory Visit (HOSPITAL_COMMUNITY): Payer: Self-pay

## 2022-07-02 ENCOUNTER — Emergency Department (HOSPITAL_COMMUNITY)
Admission: EM | Admit: 2022-07-02 | Discharge: 2022-07-02 | Disposition: A | Discharge status: Home / Self Care | Payer: Self-pay | Attending: Emergency Medicine | Admitting: Emergency Medicine

## 2022-07-02 ENCOUNTER — Encounter (HOSPITAL_COMMUNITY): Payer: Self-pay | Admitting: Emergency Medicine

## 2022-07-02 ENCOUNTER — Other Ambulatory Visit: Payer: Self-pay

## 2022-07-02 ENCOUNTER — Emergency Department (HOSPITAL_COMMUNITY): Payer: Self-pay

## 2022-07-02 DIAGNOSIS — Z760 Encounter for issue of repeat prescription: Secondary | ICD-10-CM

## 2022-07-02 DIAGNOSIS — Z79899 Other long term (current) drug therapy: Secondary | ICD-10-CM | POA: Insufficient documentation

## 2022-07-02 DIAGNOSIS — I10 Essential (primary) hypertension: Secondary | ICD-10-CM

## 2022-07-02 DIAGNOSIS — R42 Dizziness and giddiness: Secondary | ICD-10-CM

## 2022-07-02 LAB — CBC WITH DIFFERENTIAL/PLATELET
Abs Immature Granulocytes: 0.03 10*3/uL (ref 0.00–0.07)
Basophils Absolute: 0.1 10*3/uL (ref 0.0–0.1)
Basophils Relative: 0 %
Eosinophils Absolute: 0.3 10*3/uL (ref 0.0–0.5)
Eosinophils Relative: 2 %
HCT: 49.7 % (ref 39.0–52.0)
Hemoglobin: 16.9 g/dL (ref 13.0–17.0)
Immature Granulocytes: 0 %
Lymphocytes Relative: 24 %
Lymphs Abs: 2.9 10*3/uL (ref 0.7–4.0)
MCH: 30.5 pg (ref 26.0–34.0)
MCHC: 34 g/dL (ref 30.0–36.0)
MCV: 89.7 fL (ref 80.0–100.0)
Monocytes Absolute: 0.8 10*3/uL (ref 0.1–1.0)
Monocytes Relative: 6 %
Neutro Abs: 8 10*3/uL — ABNORMAL HIGH (ref 1.7–7.7)
Neutrophils Relative %: 68 %
Platelets: 231 10*3/uL (ref 150–400)
RBC: 5.54 MIL/uL (ref 4.22–5.81)
RDW: 12.6 % (ref 11.5–15.5)
WBC: 12 10*3/uL — ABNORMAL HIGH (ref 4.0–10.5)
nRBC: 0 % (ref 0.0–0.2)

## 2022-07-02 LAB — COMPREHENSIVE METABOLIC PANEL
ALT: 23 U/L (ref 0–44)
AST: 18 U/L (ref 15–41)
Albumin: 3.5 g/dL (ref 3.5–5.0)
Alkaline Phosphatase: 61 U/L (ref 38–126)
Anion gap: 8 (ref 5–15)
BUN: 8 mg/dL (ref 6–20)
CO2: 25 mmol/L (ref 22–32)
Calcium: 8.8 mg/dL — ABNORMAL LOW (ref 8.9–10.3)
Chloride: 105 mmol/L (ref 98–111)
Creatinine, Ser: 0.88 mg/dL (ref 0.61–1.24)
GFR, Estimated: >60 mL/min (ref >60)
Glucose, Bld: 80 mg/dL (ref 70–99)
Potassium: 3.9 mmol/L (ref 3.5–5.1)
Sodium: 138 mmol/L (ref 135–145)
Total Bilirubin: 0.6 mg/dL (ref 0.3–1.2)
Total Protein: 6.3 g/dL — ABNORMAL LOW (ref 6.5–8.1)

## 2022-07-02 LAB — URINALYSIS, ROUTINE W REFLEX MICROSCOPIC
Bilirubin Urine: NEGATIVE
Glucose, UA: NEGATIVE mg/dL
Hgb urine dipstick: NEGATIVE
Ketones, ur: NEGATIVE mg/dL
Leukocytes,Ua: NEGATIVE
Nitrite: NEGATIVE
Protein, ur: NEGATIVE mg/dL
Specific Gravity, Urine: 1.015 (ref 1.005–1.030)
pH: 6 (ref 5.0–8.0)

## 2022-07-02 MED ORDER — LISINOPRIL 40 MG PO TABS: 40.0000 mg | ORAL_TABLET | Freq: Every day | ORAL | 0 refills | Status: AC

## 2022-07-02 NOTE — ED Provider Notes (Signed)
[facility] EMERGENCY DEPARTMENT Provider Note   CSN: [id number] Arrival date & time: 07/02/22  1610     History  Chief Complaint  Patient presents with   Dizziness    PARMVIR BOOMER is a 46 y.o. male.  Pt c/o mild lightheadedness earlier this AM, worse when stood up quickly. No syncope.  Also states wants to get a new rx for his lisinopril. Patient denies vertigo or room spinning sensation. States no problems w gait, balance or coordination. No change in speech or vision. No numbness/weakness. No ear pain, tinnitus, or hearing loss. No uri symptoms. No headache. Denies chest pain or discomfort of any sort. No sob or unusual doe. Denies fever or chills. Denies abd pain or nvd. No rectal bleeding or melena. Denies change in meds or new med, except out of lisinopril in past couple weeks. Pt indicates his earlier symptoms completely unlike symptoms w cva earlier this year.   The history is provided by the patient and medical records.  Dizziness Associated symptoms: no blood in stool, no chest pain, no headaches, no hearing loss, no nausea, no palpitations, no shortness of breath, no vomiting and no weakness        Home Medications Prior to Admission medications   Medication Sig Start Date End Date Taking? Authorizing Provider  Acetaminophen 500 MG capsule Take 500-1,000 mg by mouth daily as needed for pain (or headaches).    [provider]  amLODipine (NORVASC) 5 MG tablet Take 1 tablet (5 mg total) by mouth daily. 01/28/22   Kathlen Mody, MD  aspirin 325 MG tablet Take 1 tablet (325 mg total) by mouth daily. 01/28/22   Kathlen Mody, MD  atorvastatin (LIPITOR) 80 MG tablet Take 1 tablet (80 mg total) by mouth daily. 01/28/22   Kathlen Mody, MD  clopidogrel (PLAVIX) 75 MG tablet Take 1 tablet (75 mg total) by mouth daily. 01/28/22   Kathlen Mody, MD  lisinopril (ZESTRIL) 40 MG tablet Take 1 tablet (40 mg total) by mouth daily. 01/27/22   Kathlen Mody, MD   pantoprazole (PROTONIX) 40 MG tablet Take 1 tablet (40 mg total) by mouth daily. 01/28/22   Kathlen Mody, MD      Allergies    Patient has no known allergies.    Review of Systems   Review of Systems  Constitutional:  Negative for chills and fever.  HENT:  Negative for congestion, ear pain, hearing loss and sore throat.   Eyes:  Negative for redness and visual disturbance.  Respiratory:  Negative for shortness of breath.   Cardiovascular:  Negative for chest pain, palpitations and leg swelling.  Gastrointestinal:  Negative for abdominal pain, blood in stool, nausea and vomiting.  Genitourinary:  Negative for dysuria and flank pain.  Musculoskeletal:  Negative for back pain and neck pain.  Skin:  Negative for rash.  Neurological:  Positive for light-headedness. Negative for speech difficulty, weakness, numbness and headaches.  Hematological:  Does not bruise/bleed easily.  Psychiatric/Behavioral:  Negative for confusion.     Physical Exam Updated Vital Signs BP (!) 122/93   Pulse 71   Temp 98.2 F (36.8 C) (Oral)   Resp 18   Ht 1.803 m (5\' 11" )   Wt (!) 142.9 kg   SpO2 96%   BMI 43.93 kg/m  Physical Exam Vitals and nursing note reviewed.  Constitutional:      Appearance: Normal appearance. He is well-developed.  HENT:     Head: Atraumatic.  Nose: Nose normal.     Mouth/Throat:     Mouth: Mucous membranes are moist.     Pharynx: Oropharynx is clear.  Eyes:     General: No scleral icterus.    Conjunctiva/sclera: Conjunctivae normal.     Pupils: Pupils are equal, round, and reactive to light.  Neck:     Vascular: No carotid bruit.     Trachea: No tracheal deviation.  Cardiovascular:     Rate and Rhythm: Normal rate and regular rhythm.     Pulses: Normal pulses.     Heart sounds: Normal heart sounds. No murmur heard.    No friction rub. No gallop.  Pulmonary:     Effort: Pulmonary effort is normal. No accessory muscle usage or respiratory distress.     Breath  sounds: Normal breath sounds.  Abdominal:     General: Bowel sounds are normal. There is no distension.     Palpations: Abdomen is soft. There is no mass.     Tenderness: There is no abdominal tenderness.  Genitourinary:    Comments: No cva tenderness. Musculoskeletal:        General: No swelling.     Cervical back: Normal range of motion and neck supple. No rigidity.  Skin:    General: Skin is warm and dry.     Findings: No rash.  Neurological:     General: No focal deficit present.     Mental Status: He is alert and oriented to person, place, and time.     Cranial Nerves: No cranial nerve deficit.     Comments: Alert, speech clear. No dysarthria or aphasia. Motor/sens intact bil. No pronator drift. Balance, gait and coordination appear normal. No ataxia.   Psychiatric:        Mood and Affect: Mood normal.     ED Results / Procedures / Treatments   Labs (all labs ordered are listed, but only abnormal results are displayed) Results for orders placed or performed during the hospital encounter of 07/02/22  Urinalysis, Routine w reflex microscopic Urine, Clean Catch  Result Value Ref Range   Color, Urine YELLOW YELLOW   APPearance CLEAR CLEAR   Specific Gravity, Urine 1.015 1.005 - 1.030   pH 6.0 5.0 - 8.0   Glucose, UA NEGATIVE NEGATIVE mg/dL   Hgb urine dipstick NEGATIVE NEGATIVE   Bilirubin Urine NEGATIVE NEGATIVE   Ketones, ur NEGATIVE NEGATIVE mg/dL   Protein, ur NEGATIVE NEGATIVE mg/dL   Nitrite NEGATIVE NEGATIVE   Leukocytes,Ua NEGATIVE NEGATIVE  CBC WITH DIFFERENTIAL  Result Value Ref Range   WBC 12.0 (H) 4.0 - 10.5 K/uL   RBC 5.54 4.22 - 5.81 MIL/uL   Hemoglobin 16.9 13.0 - 17.0 g/dL   HCT 69.4 85.4 - 62.7 %   MCV 89.7 80.0 - 100.0 fL   MCH 30.5 26.0 - 34.0 pg   MCHC 34.0 30.0 - 36.0 g/dL   RDW 03.5 00.9 - 38.1 %   Platelets 231 150 - 400 K/uL   nRBC 0.0 0.0 - 0.2 %   Neutrophils Relative % 68 %   Neutro Abs 8.0 (H) 1.7 - 7.7 K/uL   Lymphocytes Relative 24  %   Lymphs Abs 2.9 0.7 - 4.0 K/uL   Monocytes Relative 6 %   Monocytes Absolute 0.8 0.1 - 1.0 K/uL   Eosinophils Relative 2 %   Eosinophils Absolute 0.3 0.0 - 0.5 K/uL   Basophils Relative 0 %   Basophils Absolute 0.1 0.0 - 0.1 K/uL  Immature Granulocytes 0 %   Abs Immature Granulocytes 0.03 0.00 - 0.07 K/uL  Comprehensive metabolic panel  Result Value Ref Range   Sodium 138 135 - 145 mmol/L   Potassium 3.9 3.5 - 5.1 mmol/L   Chloride 105 98 - 111 mmol/L   CO2 25 22 - 32 mmol/L   Glucose, Bld 80 70 - 99 mg/dL   BUN 8 6 - 20 mg/dL   Creatinine, Ser 7.06 0.61 - 1.24 mg/dL   Calcium 8.8 (L) 8.9 - 10.3 mg/dL   Total Protein 6.3 (L) 6.5 - 8.1 g/dL   Albumin 3.5 3.5 - 5.0 g/dL   AST 18 15 - 41 U/L   ALT 23 0 - 44 U/L   Alkaline Phosphatase 61 38 - 126 U/L   Total Bilirubin 0.6 0.3 - 1.2 mg/dL   GFR, Estimated >23 >76 mL/min   Anion gap 8 5 - 15   CT Head Wo Contrast  Result Date: 07/02/2022 CLINICAL DATA:  Dizziness EXAM: CT HEAD WITHOUT CONTRAST TECHNIQUE: Contiguous axial images were obtained from the base of the skull through the vertex without intravenous contrast. RADIATION DOSE REDUCTION: This exam was performed according to the departmental dose-optimization program which includes automated exposure control, adjustment of the mA and/or kV according to patient size and/or use of iterative reconstruction technique. COMPARISON:  01/25/2022 FINDINGS: Brain: No acute intracranial findings are seen. There are no signs of bleeding within the cranium. Ventricles are not dilated. There are multiple old lacunar infarcts in basal ganglia on both sides. Cortical sulci are prominent. Vascular: Unremarkable. Skull: No fracture is seen in calvarium. Sinuses/Orbits: There is mucosal thickening in ethmoid sinus. 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 Other: There is increased amount of CSF insula suggesting partial empty sella. IMPRESSION: No acute intracranial findings are seen. 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 Cortical atrophy. Chronic ethmoid sinusitis. Electronically Signed   By: Ernie Avena M.D.   On: 07/02/2022 17:37      EKG EKG Interpretation  Date/Time:  Saturday July 02 2022 16:26:39 EDT Ventricular Rate:  86 PR Interval:  150 QRS Duration: 70 QT Interval:  336 QTC Calculation: 402 R Axis:   2 Text Interpretation: Normal sinus rhythm T wave abnormality, consider lateral ischemia Abnormal ECG When compared with ECG of 25-Jan-2022 11:04, PREVIOUS ECG IS PRESENT Confirmed by Cathren Laine ([id number]) on 07/02/2022 9:21:01 PM  Radiology CT Head Wo Contrast  Result Date: 07/02/2022 CLINICAL DATA:  Dizziness EXAM: CT HEAD WITHOUT CONTRAST TECHNIQUE: Contiguous axial images were obtained from the base of the skull through the vertex without intravenous contrast. RADIATION DOSE REDUCTION: This exam was performed according to the departmental dose-optimization program which includes automated exposure control, adjustment of the mA and/or kV according to patient size and/or use of iterative reconstruction technique. COMPARISON:  01/25/2022 FINDINGS: Brain: No acute intracranial findings are seen. There are no signs of bleeding within the cranium. Ventricles are not dilated. There are multiple old lacunar infarcts in basal ganglia on both sides. Cortical sulci are prominent. Vascular: Unremarkable. Skull: No fracture is seen in calvarium. Sinuses/Orbits: There is mucosal thickening in ethmoid sinus. 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 Other: There is increased amount of CSF insula suggesting partial empty sella. IMPRESSION: No acute intracranial findings are seen. There are multiple small old lacunar infarcts in basal ganglia on both sides. Cortical atrophy. Chronic ethmoid sinusitis. Electronically Signed   By: Ernie Avena M.D.   On: 07/02/2022 17:37  Procedures Procedures    Medications Ordered in ED Medications - No data to display  ED  Course/ Medical Decision Making/ A&P                           Medical Decision Making Problems Addressed: Essential hypertension: chronic illness or injury that poses a threat to life or bodily functions Lightheadedness: acute illness or injury with systemic symptoms Medication refill: acute illness or injury  Amount and/or Complexity of Data Reviewed External Data Reviewed: labs, radiology and notes. Labs: ordered. Decision-making details documented in ED Course. Radiology: ordered and independent interpretation performed. Decision-making details documented in ED Course. ECG/medicine tests: ordered and independent interpretation performed. Decision-making details documented in ED Course.  Risk Prescription drug management. Decision regarding hospitalization.   Iv ns. Continuous pulse ox and cardiac monitoring. Labs ordered/sent. Imaging ordered.   Diff dx includes orthostasis, dehydration, anemia, etc - dispo decision including possible need for admission if abn ct, severe anemia, aki, etc - will get labs and imaging and reassess.   Reviewed nursing notes and prior charts for additional history. External reports reviewed.   Cardiac monitor: sinus rhythm, rate 71.  Labs reviewed/interpreted by me - hgb normal. Chem normal.   CT reviewed/interpreted by me - no hem/acute process  Pt notes symptoms resolved. He currently indicates just wants d/c and rx lisinopril - will provide.  Vitals normal, symptoms resolved, normal neuro exam - pt currently appears stable for d/c.  Rec close pcp f/u.  Return precautions provided.            Final Clinical Impression(s) / ED Diagnoses Final diagnoses:  None    Rx / DC Orders ED Discharge Orders     None         Cathren Laine, MD 07/02/22 2155

## 2022-07-02 NOTE — Discharge Instructions (Addendum)
It was our pleasure to provide your ER care today - we hope that you feel better.  Drink plenty of fluids/stay well hydrated.   Take your meds as prescribed by your doctor.   From prior admission, it appears they wanted you to follow up with neurology post discharge - follow up with neurologist in one week.  Also follow up closely with primary care doctor in the next next 1-2 weeks.   Return to ER if worse, new symptoms, fevers, chest pain, trouble breathing, weak/fainting, numbness/weakness, change in speech, vision or gait, or other concern.

## 2022-07-02 NOTE — ED Provider Triage Note (Signed)
Emergency Medicine Provider Triage Evaluation Note  Timothy Pratt , a 46 y.o. male  was evaluated in triage.  Pt complains of dizziness started around 9-10 AM this morning.  Worse with movement, improves with rest.  Denies any recent head injury, LOC, chest pain, shortness of breath, syncope.  AAOx4.  Recent CVA several months ago, states this feels very different.  Denies any other neurodeficits at this time.  On Plavix 75 mg daily.  States he has been out of his blood pressure medication for 2 weeks.  Review of Systems  Positive:  Negative: See above  Physical Exam  BP (!) 152/118 (BP Location: Right Arm)   Pulse 83   Temp 98.4 F (36.9 C) (Oral)   Resp 18   SpO2 96%  Gen:   Awake, no distress   Resp:  Normal effort  MSK:   Moves extremities without difficulty  Other:  PERRLA.  Normal EOMs.  No aphasia, pronator drift, facial asymmetry, abnormal coordination or gait.  Range of motion sensation appears grossly intact.  Medical Decision Making  Medically screening exam initiated at 4:38 PM.  Appropriate orders placed.  Timothy Pratt was informed that the remainder of the evaluation will be completed by another provider, this initial triage assessment does not replace that evaluation, and the importance of remaining in the ED until their evaluation is complete.     Cecil Cobbs, PA-C 07/02/22 1647

## 2022-07-02 NOTE — ED Triage Notes (Signed)
Patient here POV for complaints of feeling dizzy especially with movement, since waking up this morning. Denies any LOC, chest pain, shortness of breath. Pt Aox4. NAD at this time.
